# Patient Record
Sex: Female | Born: 1975 | Race: White | Hispanic: No | Smoking: Former smoker
Health system: Southern US, Community
[De-identification: ages and names within clinical notes are randomized; demographics above are authoritative.]

## PROBLEM LIST (undated history)

## (undated) DIAGNOSIS — J309 Allergic rhinitis, unspecified: Secondary | ICD-10-CM

## (undated) DIAGNOSIS — S42009D Fracture of unspecified part of unspecified clavicle, subsequent encounter for fracture with routine healing: Secondary | ICD-10-CM

## (undated) DIAGNOSIS — F909 Attention-deficit hyperactivity disorder, unspecified type: Secondary | ICD-10-CM

## (undated) DIAGNOSIS — Z8742 Personal history of other diseases of the female genital tract: Secondary | ICD-10-CM

## (undated) DIAGNOSIS — S62109A Fracture of unspecified carpal bone, unspecified wrist, initial encounter for closed fracture: Secondary | ICD-10-CM

## (undated) DIAGNOSIS — J329 Chronic sinusitis, unspecified: Secondary | ICD-10-CM

## (undated) HISTORY — DX: Chronic sinusitis, unspecified: J32.9

## (undated) HISTORY — DX: Allergic rhinitis, unspecified: J30.9

## (undated) HISTORY — DX: Personal history of other diseases of the female genital tract: Z87.42

## (undated) HISTORY — DX: Attention-deficit hyperactivity disorder, unspecified type: F90.9

## (undated) HISTORY — DX: Fracture of unspecified part of unspecified clavicle, subsequent encounter for fracture with routine healing: S42.009D

## (undated) HISTORY — DX: Fracture of unspecified carpal bone, unspecified wrist, initial encounter for closed fracture: S62.109A

## (undated) HISTORY — PX: WISDOM TOOTH EXTRACTION: SHX21

---

## 2001-11-26 ENCOUNTER — Other Ambulatory Visit: Admission: RE | Admit: 2001-11-26 | Discharge: 2001-11-26 | Payer: Self-pay | Admitting: Obstetrics and Gynecology

## 2002-06-12 ENCOUNTER — Inpatient Hospital Stay (HOSPITAL_COMMUNITY): Admission: AD | Admit: 2002-06-12 | Discharge: 2002-06-15 | Payer: Self-pay | Admitting: Obstetrics and Gynecology

## 2002-12-07 ENCOUNTER — Other Ambulatory Visit: Admission: RE | Admit: 2002-12-07 | Discharge: 2002-12-07 | Payer: Self-pay | Admitting: Obstetrics and Gynecology

## 2003-12-08 ENCOUNTER — Other Ambulatory Visit: Admission: RE | Admit: 2003-12-08 | Discharge: 2003-12-08 | Payer: Self-pay | Admitting: Obstetrics and Gynecology

## 2004-06-16 ENCOUNTER — Ambulatory Visit (HOSPITAL_COMMUNITY): Admission: RE | Admit: 2004-06-16 | Discharge: 2004-06-16 | Payer: Self-pay | Admitting: Obstetrics and Gynecology

## 2005-02-03 ENCOUNTER — Inpatient Hospital Stay (HOSPITAL_COMMUNITY): Admission: AD | Admit: 2005-02-03 | Discharge: 2005-02-03 | Payer: Self-pay | Admitting: *Deleted

## 2005-02-05 ENCOUNTER — Inpatient Hospital Stay (HOSPITAL_COMMUNITY): Admission: AD | Admit: 2005-02-05 | Discharge: 2005-02-07 | Payer: Self-pay | Admitting: Obstetrics and Gynecology

## 2005-03-21 ENCOUNTER — Other Ambulatory Visit: Admission: RE | Admit: 2005-03-21 | Discharge: 2005-03-21 | Payer: Self-pay | Admitting: Obstetrics and Gynecology

## 2008-01-27 ENCOUNTER — Ambulatory Visit: Payer: Self-pay | Admitting: Internal Medicine

## 2010-04-14 ENCOUNTER — Observation Stay: Payer: Self-pay

## 2010-08-10 ENCOUNTER — Inpatient Hospital Stay: Payer: Self-pay

## 2013-01-16 ENCOUNTER — Inpatient Hospital Stay: Payer: Self-pay | Admitting: Obstetrics and Gynecology

## 2013-01-16 LAB — CBC WITH DIFFERENTIAL/PLATELET
Basophil %: 0.1 %
Lymphocyte #: 1.3 10*3/uL (ref 1.0–3.6)
MCV: 91 fL (ref 80–100)
Monocyte #: 1.1 x10 3/mm — ABNORMAL HIGH (ref 0.2–0.9)
Monocyte %: 6 %
Neutrophil #: 16.4 10*3/uL — ABNORMAL HIGH (ref 1.4–6.5)
Platelet: 178 10*3/uL (ref 150–440)
RBC: 3.86 10*6/uL (ref 3.80–5.20)
RDW: 13.4 % (ref 11.5–14.5)
WBC: 18.9 10*3/uL — ABNORMAL HIGH (ref 3.6–11.0)

## 2014-09-28 NOTE — H&P (Signed)
L&D Evaluation:  History Expanded:  HPI 39 yo G5P3013 at 3737w4d gestational age by LMP consistent with 1st trimester ultrasound. Pregnancy complicated by advanced maternal age (NIFT testing negative).  She presents in advanced labor. positive fetal movement, no vaginal bleeding, no leakage of fluid.   Gravida 5   Term 3   PreTerm 0   Abortion 1   Living 3   Blood Type (Maternal) O positive   Group B Strep Results Maternal (Result >5wks must be treated as unknown) negative   Maternal HIV Negative   Maternal Syphilis Ab Nonreactive   Maternal Varicella Immune   Rubella Results (Maternal) immune   Maternal [facility]-Dap Immune   [facility]   Patient's Medical History No Chronic Illness   Patient's Surgical History Othropedic surgery of clavicle and wrist due to fracture, wisdom teeth extraction   Medications Pre Natal Vitamins   Allergies NKDA   Social History none   Family History Non-Contributory   ROS:  ROS All systems were reviewed.  HEENT, CNS, GI, GU, Respiratory, CV, Renal and Musculoskeletal systems were found to be normal., unless noted in HPI   Exam:  General mild-moderate distress with contractions   Mental Status clear   Chest moves air well   Abdomen gravid, tender with contractions   Estimated Fetal Weight Average for gestational age   Fetal Position cephalic   Edema no edema   Pelvic Fully dilated per RN   Mebranes Ruptured, shortly after arrival (clear fluid per RN)   Description clear   FHT normal rate with no decels   Skin no lesions   Impression:  Impression active labor   Plan:  Plan EFM/NST   Comments expectant management. expectant delivery couple desires cord blood collection for private blood banking   Electronic Signatures: Conard NovakJackson, Elmer Merwin D (MD)  (Signed 29-Aug-14 11:53)  Authored: L&D Evaluation   Last Updated: 29-Aug-14 11:53 by Conard NovakJackson, Damilola Flamm D (MD)

## 2015-05-22 DIAGNOSIS — Z8742 Personal history of other diseases of the female genital tract: Secondary | ICD-10-CM

## 2015-05-22 HISTORY — DX: Personal history of other diseases of the female genital tract: Z87.42

## 2015-05-22 HISTORY — PX: ABDOMINOPLASTY: SUR9

## 2015-09-21 HISTORY — PX: COLPOSCOPY: SHX161

## 2016-11-12 ENCOUNTER — Emergency Department: Payer: BLUE CROSS/BLUE SHIELD

## 2016-11-12 ENCOUNTER — Emergency Department
Admission: EM | Admit: 2016-11-12 | Discharge: 2016-11-12 | Disposition: A | Discharge status: Home / Self Care | Payer: BLUE CROSS/BLUE SHIELD | Attending: Emergency Medicine | Admitting: Emergency Medicine

## 2016-11-12 ENCOUNTER — Encounter: Payer: Self-pay | Admitting: *Deleted

## 2016-11-12 DIAGNOSIS — M25531 Pain in right wrist: Secondary | ICD-10-CM | POA: Diagnosis present

## 2016-11-12 MED ORDER — MELOXICAM 7.5 MG PO TABS: 7.5000 mg | ORAL_TABLET | Freq: Every day | ORAL | 1 refills | Status: AC

## 2016-11-12 NOTE — ED Triage Notes (Signed)
Pt was playing soccer tonight and tried to stop the ball, pt complains of right wrist pain

## 2016-11-12 NOTE — ED Provider Notes (Signed)
[facility] Emergency Department Provider Note  ____________________________________________  Time seen: Approximately 9:29 PM  I have reviewed the triage vital signs and the nursing notes.   HISTORY  Chief Complaint Wrist Pain    HPI Jamie Meadows is a 41 y.o. female presenting to the emergency department with right wrist pain after a soccer ball collided with right wrist while playing tonight. Patient denies falls, weakness, radiculopathy and diminished sensation of the right upper extremity. Patient denies prior traumas or surgeries to the right upper extremity. No alleviating measures have been attempted.   History reviewed. No pertinent past medical history.  There are no active problems to display for this patient.   History reviewed. No pertinent surgical history.  Prior to Admission medications   Medication Sig Start Date End Date Taking? Authorizing Provider  meloxicam (MOBIC) 7.5 MG tablet Take 1 tablet (7.5 mg total) by mouth daily. 11/12/16 11/19/16  Orvil FeilWoods, Treniya Lobb M, PA-C    Allergies Patient has no known allergies.  No family history on file.  Social History Social History  Substance Use Topics  . Smoking status: Never Smoker  . Smokeless tobacco: Not on file  . Alcohol use Yes     Review of Systems  Constitutional: No fever/chills Eyes: No visual changes. No discharge ENT: No upper respiratory complaints. Cardiovascular: no chest pain. Respiratory: no cough. No SOB. Gastrointestinal: No abdominal pain.  No nausea, no vomiting.  No diarrhea.  No constipation. Musculoskeletal: Patient has right wrist pain Skin: Negative for rash, abrasions, lacerations, ecchymosis. Neurological: Negative for headaches, focal weakness or numbness. ____________________________________________   PHYSICAL EXAM:  VITAL SIGNS: ED Triage Vitals  Enc Vitals Group     BP 11/12/16 2027 131/76     Pulse Rate 11/12/16 2027 (!) 114     Resp  11/12/16 2027 18     Temp 11/12/16 2027 98.6 F (37 C)     Temp Source 11/12/16 2027 Oral     SpO2 11/12/16 2027 100 %     Weight 11/12/16 2027 150 lb (68 kg)     Height 11/12/16 2027 5\' 5"  (1.651 m)     Head Circumference --      Peak Flow --      Pain Score 11/12/16 2029 5     Pain Loc --      Pain Edu? --      Excl. in GC? --      Constitutional: Alert and oriented. Well appearing and in no acute distress. Eyes: Conjunctivae are normal. PERRL. EOMI. Head: Atraumatic. Cardiovascular: Normal rate, regular rhythm. Normal S1 and S2.  Good peripheral circulation. Respiratory: Normal respiratory effort without tachypnea or retractions. Lungs CTAB. Good air entry to the bases with no decreased or absent breath sounds. Musculoskeletal: Patient has 5 out of 5 strength in the upper extremities bilaterally. Patient performs full range of motion at the right shoulder, right elbow and right wrist. Patient can move all 5 right fingers. No pain to palpation of the anatomical snuffbox. Patient has mild tenderness to palpation over the metaphysis of the mid distal radius. Palpable radial and ulnar pulses bilaterally and symmetrically. Neurologic:  Normal speech and language. No gross focal neurologic deficits are appreciated.  Skin: Mild soft tissue swelling of the skin overlying the distal right radius. Psychiatric: Mood and affect are normal. Speech and behavior are normal. Patient exhibits appropriate insight and judgement.   ____________________________________________   LABS (all labs ordered are listed, but only abnormal results are  displayed)  Labs Reviewed - No data to display ____________________________________________  EKG   ____________________________________________  RADIOLOGY Geraldo Pitter, personally viewed and evaluated these images (plain radiographs) as part of my medical decision making, as well as reviewing the written report by the radiologist.  Dg Wrist  Complete Right  Result Date: 11/12/2016 CLINICAL DATA:  Wrist pain after soccer injury EXAM: RIGHT WRIST - COMPLETE 3+ VIEW COMPARISON:  None. FINDINGS: There is no evidence of fracture or dislocation. There is no evidence of arthropathy or other focal bone abnormality. Soft tissues are unremarkable. IMPRESSION: No acute osseous abnormality of the right wrist. Electronically Signed   By: Deatra Robinson M.D.   On: 11/12/2016 21:03    ____________________________________________    PROCEDURES  Procedure(s) performed:    Procedures    Medications - No data to display   ____________________________________________   INITIAL IMPRESSION / ASSESSMENT AND PLAN / ED COURSE  Pertinent labs & imaging results that were available during my care of the patient were reviewed by me and considered in my medical decision making (see chart for details).  Review of the [place] CSRS was performed in accordance of the [facility] prior to dispensing any controlled drugs.     Assessment and plan Right wrist pain Patient's diagnosis is consistent with right wrist pain. Patient will be discharged home with prescriptions for Mobic. A splint was applied in the emergency department. Patient is to follow up with Dr. Joice Lofts as needed. Patient is given ED precautions to return to the ED for any worsening or new symptoms. ____________________________________________  FINAL CLINICAL IMPRESSION(S) / ED DIAGNOSES  Final diagnoses:  Right wrist pain      NEW MEDICATIONS STARTED DURING THIS VISIT:  New Prescriptions   MELOXICAM (MOBIC) 7.5 MG TABLET    Take 1 tablet (7.5 mg total) by mouth daily.        This chart was dictated using voice recognition software/Dragon. Despite best efforts to proofread, errors can occur which can change the meaning. Any change was purely unintentional.    Gasper Lloyd 11/12/16 2136    Rockne Menghini, MD 11/12/16 [phone]

## 2017-05-01 ENCOUNTER — Ambulatory Visit: Payer: Self-pay

## 2017-05-02 ENCOUNTER — Ambulatory Visit: Payer: Self-pay

## 2017-05-03 ENCOUNTER — Ambulatory Visit (INDEPENDENT_AMBULATORY_CARE_PROVIDER_SITE_OTHER): Payer: BLUE CROSS/BLUE SHIELD

## 2017-05-03 DIAGNOSIS — J301 Allergic rhinitis due to pollen: Secondary | ICD-10-CM

## 2017-05-17 ENCOUNTER — Ambulatory Visit (INDEPENDENT_AMBULATORY_CARE_PROVIDER_SITE_OTHER): Payer: BLUE CROSS/BLUE SHIELD

## 2017-05-17 DIAGNOSIS — J301 Allergic rhinitis due to pollen: Secondary | ICD-10-CM | POA: Diagnosis not present

## 2017-05-17 DIAGNOSIS — J309 Allergic rhinitis, unspecified: Secondary | ICD-10-CM

## 2017-05-17 NOTE — Addendum Note (Signed)
Addended by: Loura BackSUTTON, Kobe Ofallon on: 05/17/2017 09:34 AM   Modules accepted: Orders

## 2017-05-31 ENCOUNTER — Ambulatory Visit: Payer: BLUE CROSS/BLUE SHIELD

## 2017-06-06 ENCOUNTER — Ambulatory Visit (INDEPENDENT_AMBULATORY_CARE_PROVIDER_SITE_OTHER): Payer: BLUE CROSS/BLUE SHIELD

## 2017-06-06 DIAGNOSIS — J309 Allergic rhinitis, unspecified: Secondary | ICD-10-CM | POA: Diagnosis not present

## 2017-06-07 ENCOUNTER — Encounter: Payer: Self-pay | Admitting: Internal Medicine

## 2017-06-07 DIAGNOSIS — J301 Allergic rhinitis due to pollen: Secondary | ICD-10-CM | POA: Diagnosis not present

## 2017-06-14 ENCOUNTER — Other Ambulatory Visit: Payer: Self-pay | Admitting: Nurse Practitioner

## 2017-06-14 DIAGNOSIS — J0140 Acute pansinusitis, unspecified: Secondary | ICD-10-CM

## 2017-06-14 MED ORDER — AZITHROMYCIN 250 MG PO TABS: ORAL_TABLET | ORAL | 0 refills | Status: DC

## 2017-06-14 NOTE — Progress Notes (Signed)
Sent in z-pack - take as directed for 5 days to [facility] [address]

## 2017-06-20 ENCOUNTER — Ambulatory Visit: Payer: BLUE CROSS/BLUE SHIELD

## 2017-06-20 DIAGNOSIS — J309 Allergic rhinitis, unspecified: Secondary | ICD-10-CM | POA: Diagnosis not present

## 2017-07-04 ENCOUNTER — Ambulatory Visit: Payer: Self-pay

## 2017-07-19 ENCOUNTER — Ambulatory Visit (INDEPENDENT_AMBULATORY_CARE_PROVIDER_SITE_OTHER): Payer: BLUE CROSS/BLUE SHIELD

## 2017-07-19 ENCOUNTER — Ambulatory Visit: Payer: Self-pay

## 2017-07-19 DIAGNOSIS — J301 Allergic rhinitis due to pollen: Secondary | ICD-10-CM | POA: Diagnosis not present

## 2017-07-30 ENCOUNTER — Ambulatory Visit: Payer: BLUE CROSS/BLUE SHIELD | Admitting: Nurse Practitioner

## 2017-07-30 ENCOUNTER — Encounter: Payer: Self-pay | Admitting: Nurse Practitioner

## 2017-07-30 ENCOUNTER — Ambulatory Visit (INDEPENDENT_AMBULATORY_CARE_PROVIDER_SITE_OTHER): Payer: BLUE CROSS/BLUE SHIELD

## 2017-07-30 VITALS — BP 120/80 | HR 84 | Resp 16 | Ht 64.0 in | Wt 173.0 lb

## 2017-07-30 DIAGNOSIS — B379 Candidiasis, unspecified: Secondary | ICD-10-CM | POA: Diagnosis not present

## 2017-07-30 DIAGNOSIS — J309 Allergic rhinitis, unspecified: Secondary | ICD-10-CM | POA: Diagnosis not present

## 2017-07-30 DIAGNOSIS — J301 Allergic rhinitis due to pollen: Secondary | ICD-10-CM | POA: Diagnosis not present

## 2017-07-30 DIAGNOSIS — R41840 Attention and concentration deficit: Secondary | ICD-10-CM | POA: Diagnosis not present

## 2017-07-30 MED ORDER — AMPHETAMINE-DEXTROAMPHETAMINE 20 MG PO TABS: 20.0000 mg | ORAL_TABLET | Freq: Two times a day (BID) | ORAL | 0 refills | Status: DC

## 2017-07-30 MED ORDER — FLUCONAZOLE 150 MG PO TABS: ORAL_TABLET | ORAL | 1 refills | Status: DC

## 2017-07-30 NOTE — Progress Notes (Signed)
[facility] [address] [place], [place] [address]  Internal MEDICINE  Office Visit Note  Patient Name: Jamie Meadows  [id number]  [id number]  Date of Service: 07/30/2017  No chief complaint on file.   The patient is here for routine follow up exam. Today, she believes she may have issue with yeast infection. She has taken two rounds of antibiotics due to sinus infection and feels like infection developed after she completed her second round of antibiotics. She is having some vaginal itching and irritation with some white colored discharge. This has no odor.  She is currently taking adderall 20mg  twice daily to help with focus and concentration. She has high demand job and has four young children. She takes adderall to help keep her focused and on track at work and at home. She needs to have refills for this medication.     Pt is here for routine follow up.    Current Medication: Outpatient Encounter Medications as of 07/30/2017  Medication Sig  . fluticasone (FLONASE) 50 MCG/ACT nasal spray Place into both nostrils daily.  Marland Kitchen loratadine (CLARITIN) 10 MG tablet Take 10 mg by mouth daily.  . Multiple Vitamin (MULTIVITAMIN) tablet Take 1 tablet by mouth daily.  . [DISCONTINUED] amphetamine-dextroamphetamine (ADDERALL XR) 20 MG 24 hr capsule Take 20 mg by mouth daily.  Marland Kitchen amphetamine-dextroamphetamine (ADDERALL) 20 MG tablet Take 1 tablet (20 mg total) by mouth 2 (two) times daily.  Marland Kitchen azithromycin (ZITHROMAX) 250 MG tablet z-pack - take as directed for 5 days (Patient not taking: Reported on 07/30/2017)  . fluconazole (DIFLUCAN) 150 MG tablet Take 1 tablet po once. May repeat dose in 3 days as needed for persistent symptoms.  Marland Kitchen nystatin cream (MYCOSTATIN) Apply 1 application topically 2 (two) times daily.  Marland Kitchen tretinoin (RETIN-A) 0.05 % cream Apply topically at bedtime.  . [DISCONTINUED] amphetamine-dextroamphetamine (ADDERALL) 20 MG tablet Take 1 tablet (20 mg total) by  mouth 2 (two) times daily.  . [DISCONTINUED] amphetamine-dextroamphetamine (ADDERALL) 20 MG tablet Take 1 tablet (20 mg total) by mouth 2 (two) times daily.   No facility-administered encounter medications on file as of 07/30/2017.     Surgical History: No past surgical history on file.  Medical History: No past medical history on file.  Family History: No family history on file.  Social History   Socioeconomic History  . Marital status: Married    Spouse name: Not on file  . Number of children: Not on file  . Years of education: Not on file  . Highest education level: Not on file  Social Needs  . Financial resource strain: Not on file  . Food insecurity - worry: Not on file  . Food insecurity - inability: Not on file  . Transportation needs - medical: Not on file  . Transportation needs - non-medical: Not on file  Occupational History  . Not on file  Tobacco Use  . Smoking status: Never Smoker  . Smokeless tobacco: Never Used  Substance and Sexual Activity  . Alcohol use: Yes  . Drug use: Not on file  . Sexual activity: Not on file  Other Topics Concern  . Not on file  Social History Narrative  . Not on file      Review of Systems  Constitutional: Negative for activity change, chills, fatigue and unexpected weight change.  HENT: Negative for congestion, postnasal drip, rhinorrhea, sneezing and sore throat.   Eyes: Negative.  Negative for redness.  Respiratory: Negative for cough, chest tightness  and shortness of breath.   Cardiovascular: Negative for chest pain and palpitations.  Gastrointestinal: Negative for abdominal pain, constipation, diarrhea, nausea and vomiting.  Endocrine: Negative for cold intolerance, heat intolerance, polydipsia, polyphagia and polyuria.  Genitourinary: Positive for vaginal discharge. Negative for dysuria and frequency.       Vaginal itching and irritation.   Musculoskeletal: Negative for arthralgias, back pain, joint swelling and  neck pain.  Skin: Negative for rash.  Allergic/Immunologic: Positive for environmental allergies.  Neurological: Negative for tremors, numbness and headaches.  Hematological: Negative for adenopathy. Does not bruise/bleed easily.  Psychiatric/Behavioral: Positive for decreased concentration. Negative for behavioral problems (Depression), sleep disturbance and suicidal ideas. The patient is not nervous/anxious.     Today's Vitals   07/30/17 0923  BP: 120/80  Pulse: 84  Resp: 16  SpO2: 98%  Weight: 173 lb (78.5 kg)  Height: 5\' 4"  (1.626 m)    Physical Exam  Constitutional: She is oriented to person, place, and time. She appears well-developed and well-nourished. No distress.  HENT:  Head: Normocephalic and atraumatic.  Mouth/Throat: Oropharynx is clear and moist. No oropharyngeal exudate.  Eyes: EOM are normal. Pupils are equal, round, and reactive to light.  Neck: Normal range of motion. Neck supple. No JVD present. No tracheal deviation present. No thyromegaly present.  Cardiovascular: Normal rate, regular rhythm and normal heart sounds. Exam reveals no gallop and no friction rub.  No murmur heard. Pulmonary/Chest: Effort normal and breath sounds normal. No respiratory distress. She has no wheezes. She has no rales. She exhibits no tenderness.  Abdominal: Soft. Bowel sounds are normal. There is no tenderness.  Musculoskeletal: Normal range of motion.  Lymphadenopathy:    She has no cervical adenopathy.  Neurological: She is alert and oriented to person, place, and time. No cranial nerve deficit.  Skin: Skin is warm and dry. She is not diaphoretic.  Psychiatric: She has a normal mood and affect. Her behavior is normal. Judgment and thought content normal.  Nursing note and vitals reviewed.  Assessment/Plan: 1. Attention and concentration deficit diong well. May continue adderall 20mg  BID as needed. Three 30 day prescriptions provided. Dates are 07/30/2017, 08/28/2017, and  09/25/2017.  - amphetamine-dextroamphetamine (ADDERALL) 20 MG tablet; Take 1 tablet (20 mg total) by mouth 2 (two) times daily.  Dispense: 60 tablet; Refill: 0  2. Candidiasis - fluconazole (DIFLUCAN) 150 MG tablet; Take 1 tablet po once. May repeat dose in 3 days as needed for persistent symptoms.  Dispense: 3 tablet; Refill: 1  3. Chronic allergic rhinitis Continue to take oral allergy medication as prescribed. Continue allergy injections as scheduled.   General Counseling: Jamie LevinsJennifer verbalizes understanding of the findings of todays visit and agrees with plan of treatment. I have discussed any further diagnostic evaluation that may be needed or ordered today. We also reviewed her medications today. she has been encouraged to call the office with any questions or concerns that should arise related to todays visit.  This patient was seen by Vincent GrosHeather Tykwon Fera, FNP- C in Collaboration with Dr Lyndon CodeFozia M Khan as a part of collaborative care agreement    Meds ordered this encounter  Medications  . DISCONTD: amphetamine-dextroamphetamine (ADDERALL) 20 MG tablet    Sig: Take 1 tablet (20 mg total) by mouth 2 (two) times daily.    Dispense:  60 tablet    Refill:  0    Order Specific Question:   Supervising Provider    Answer:   Lyndon CodeKHAN, FOZIA M [[id number]]  . DISCONTD:  amphetamine-dextroamphetamine (ADDERALL) 20 MG tablet    Sig: Take 1 tablet (20 mg total) by mouth 2 (two) times daily.    Dispense:  60 tablet    Refill:  0    Fill after 08/28/2017    Order Specific Question:   Supervising Provider    Answer:   Lyndon Code [[id number]]  . amphetamine-dextroamphetamine (ADDERALL) 20 MG tablet    Sig: Take 1 tablet (20 mg total) by mouth 2 (two) times daily.    Dispense:  60 tablet    Refill:  0    Fill after 09/25/2017    Order Specific Question:   Supervising Provider    Answer:   Lyndon Code [[id number]]  . fluconazole (DIFLUCAN) 150 MG tablet    Sig: Take 1 tablet po once. May repeat dose in 3 days as needed  for persistent symptoms.    Dispense:  3 tablet    Refill:  1    Order Specific Question:   Supervising Provider    Answer:   Lyndon Code [[id number]]    Time spent: 36 Minutes     Dr Lyndon Code Internal medicine

## 2017-08-01 ENCOUNTER — Ambulatory Visit: Payer: Self-pay

## 2017-08-05 ENCOUNTER — Ambulatory Visit: Payer: Self-pay | Admitting: Internal Medicine

## 2017-08-13 ENCOUNTER — Ambulatory Visit (INDEPENDENT_AMBULATORY_CARE_PROVIDER_SITE_OTHER): Payer: BLUE CROSS/BLUE SHIELD

## 2017-08-13 DIAGNOSIS — J301 Allergic rhinitis due to pollen: Secondary | ICD-10-CM | POA: Diagnosis not present

## 2017-08-26 ENCOUNTER — Telehealth: Payer: Self-pay | Admitting: Nurse Practitioner

## 2017-08-26 ENCOUNTER — Encounter: Payer: Self-pay | Admitting: Internal Medicine

## 2017-08-26 ENCOUNTER — Ambulatory Visit (INDEPENDENT_AMBULATORY_CARE_PROVIDER_SITE_OTHER): Payer: BLUE CROSS/BLUE SHIELD | Admitting: Internal Medicine

## 2017-08-26 ENCOUNTER — Ambulatory Visit (INDEPENDENT_AMBULATORY_CARE_PROVIDER_SITE_OTHER): Payer: BLUE CROSS/BLUE SHIELD

## 2017-08-26 VITALS — BP 122/84 | HR 79 | Temp 98.1°F | Ht 64.0 in | Wt 172.8 lb

## 2017-08-26 DIAGNOSIS — J301 Allergic rhinitis due to pollen: Secondary | ICD-10-CM

## 2017-08-26 DIAGNOSIS — J029 Acute pharyngitis, unspecified: Secondary | ICD-10-CM | POA: Diagnosis not present

## 2017-08-26 DIAGNOSIS — J40 Bronchitis, not specified as acute or chronic: Secondary | ICD-10-CM

## 2017-08-26 LAB — POCT RAPID STREP A (OFFICE): Rapid Strep A Screen: NEGATIVE

## 2017-08-26 MED ORDER — AZITHROMYCIN 250 MG PO TABS: ORAL_TABLET | ORAL | 0 refills | Status: DC

## 2017-08-26 NOTE — Patient Instructions (Signed)

## 2017-08-26 NOTE — Progress Notes (Signed)
[facility] [address] [place], [place] [address]  Pulmonary Sleep Medicine   Office Visit Note  Patient Name: Jamie Meadows DOB: 01-13-1976 MRN [id number]  Date of Service: 08/26/2017  Complaints/HPI: She has lost her voice about 2 days ago. She has had sore throat and also has some drainage noted. She states that she is tired also. She had a strep test done which was negative. Her children has been sick also. She has no fever noted and has some sputum production no hemoptysis. She is not around any smokers. She did recently travel to [place]. In addition she states that she does have heartburn symptoms and she takes zantac prn  ROS  General: (-) fever, (-) chills, (-) night sweats, (-) weakness Skin: (-) rashes, (-) itching,. Eyes: (-) visual changes, (-) redness, (-) itching. Nose and Sinuses: (-) nasal stuffiness or itchiness, (-) postnasal drip, (-) nosebleeds, (-) sinus trouble. Mouth and Throat: (-) sore throat, (-) hoarseness. Neck: (-) swollen glands, (-) enlarged thyroid, (-) neck pain. Respiratory: + cough, (-) bloody sputum, + shortness of breath, + wheezing. Cardiovascular: - ankle swelling, (-) chest pain. Lymphatic: (-) lymph node enlargement. Neurologic: (-) numbness, (-) tingling. Psychiatric: (-) anxiety, (-) depression   Current Medication: Outpatient Encounter Medications as of 08/26/2017  Medication Sig  . amphetamine-dextroamphetamine (ADDERALL) 20 MG tablet Take 1 tablet (20 mg total) by mouth 2 (two) times daily.  . fluticasone (FLONASE) 50 MCG/ACT nasal spray Place into both nostrils daily.  Marland Kitchen loratadine (CLARITIN) 10 MG tablet Take 10 mg by mouth daily.  . Multiple Vitamin (MULTIVITAMIN) tablet Take 1 tablet by mouth daily.  Marland Kitchen azithromycin (ZITHROMAX) 250 MG tablet z-pack - take as directed for 5 days (Patient not taking: Reported on 07/30/2017)  . fluconazole (DIFLUCAN) 150 MG tablet Take 1 tablet po once. May repeat dose in 3 days as  needed for persistent symptoms. (Patient not taking: Reported on 08/26/2017)  . nystatin cream (MYCOSTATIN) Apply 1 application topically 2 (two) times daily.  Marland Kitchen tretinoin (RETIN-A) 0.05 % cream Apply topically at bedtime.   No facility-administered encounter medications on file as of 08/26/2017.     Surgical History: No past surgical history on file.  Medical History: Past Medical History:  Diagnosis Date  . Allergic rhinitis   . Sinusitis     Family History: Family History  Problem Relation Age of Onset  . Diabetes Mother   . Allergies Father     Social History: Social History   Socioeconomic History  . Marital status: Married    Spouse name: Not on file  . Number of children: Not on file  . Years of education: Not on file  . Highest education level: Not on file  Occupational History  . Not on file  Social Needs  . Financial resource strain: Not on file  . Food insecurity:    Worry: Not on file    Inability: Not on file  . Transportation needs:    Medical: Not on file    Non-medical: Not on file  Tobacco Use  . Smoking status: Former Games developer  . Smokeless tobacco: Never Used  Substance and Sexual Activity  . Alcohol use: Yes  . Drug use: Never  . Sexual activity: Not on file  Lifestyle  . Physical activity:    Days per week: Not on file    Minutes per session: Not on file  . Stress: Not on file  Relationships  . Social connections:    Talks  on phone: Not on file    Gets together: Not on file    Attends religious service: Not on file    Active member of club or organization: Not on file    Attends meetings of clubs or organizations: Not on file    Relationship status: Not on file  . Intimate partner violence:    Fear of current or ex partner: Not on file    Emotionally abused: Not on file    Physically abused: Not on file    Forced sexual activity: Not on file  Other Topics Concern  . Not on file  Social History Narrative  . Not on file    Vital  Signs: Blood pressure 122/84, pulse 79, temperature 98.1 F (36.7 C), height 5\' 4"  (1.626 m), weight 172 lb 12.8 oz (78.4 kg), SpO2 98 %.  Examination: General Appearance: The patient is well-developed, well-nourished, and in no distress. Skin: Gross inspection of skin unremarkable. Head: normocephalic, no gross deformities. Eyes: no gross deformities noted. ENT: ears appear grossly normal no exudates. Neck: Supple. No thyromegaly. No LAD. Respiratory: Scattered rhonchi. Cardiovascular: Normal S1 and S2 without murmur or rub. Extremities: No cyanosis. pulses are equal. Neurologic: Alert and oriented. No involuntary movements.  LABS: No results found for this or any previous visit (from the past 2160 hour(s)).  Radiology: Dg Wrist Complete Right  Result Date: 11/12/2016 CLINICAL DATA:  Wrist pain after soccer injury EXAM: RIGHT WRIST - COMPLETE 3+ VIEW COMPARISON:  None. FINDINGS: There is no evidence of fracture or dislocation. There is no evidence of arthropathy or other focal bone abnormality. Soft tissues are unremarkable. IMPRESSION: No acute osseous abnormality of the right wrist. Electronically Signed   By: Deatra RobinsonKevin  Herman M.D.   On: 11/12/2016 21:03    No results found.  No results found.    Assessment and Plan: Patient Active Problem List   Diagnosis Date Noted  . Attention and concentration deficit 07/30/2017  . Candidiasis 07/30/2017  . Chronic allergic rhinitis 07/30/2017    1. Acute bronchitis  Patient right now is going to be given a Z-Pak for acute bronchitis.  Her strep test was actually negative.  She does get relief from Z-Pak she last received 1 in January. 2. Allergic rhinitis  She will continue with her allergy shots she is on twice a month which will be continued 3. Heartburn on Zantac patient right now is on a daily dose will continue with Zantac as ordered as a daily dose  General Counseling: I have discussed the findings of the evaluation and  examination with Jamie Meadows.  I have also discussed any further diagnostic evaluation thatmay be needed or ordered today. Jamie Meadows verbalizes understanding of the findings of todays visit. We also reviewed her medications today and discussed drug interactions and side effects including but not limited excessive drowsiness and altered mental states. We also discussed that there is always a risk not just to her but also people around her. she has been encouraged to call the office with any questions or concerns that should arise related to todays visit.    Time spent: 15min  I have personally obtained a history, examined the patient, evaluated laboratory and imaging results, formulated the assessment and plan and placed orders.    Yevonne PaxSaadat A Kinsley Nicklaus, MD [facility] Pulmonary and Critical Care Sleep medicine

## 2017-08-27 ENCOUNTER — Other Ambulatory Visit: Payer: Self-pay | Admitting: Nurse Practitioner

## 2017-08-27 DIAGNOSIS — K921 Melena: Secondary | ICD-10-CM

## 2017-08-27 NOTE — Telephone Encounter (Signed)
Sent order for hemoccult stool to lab. She can go anytime to pick up collection kit.

## 2017-08-27 NOTE — Progress Notes (Signed)
Sent order for hemoccult stool to lab. She can go anytime to pick up collection kit.

## 2017-08-28 NOTE — Telephone Encounter (Signed)
Left message and advised pt to pick up stool card kit at [facility]

## 2017-09-04 ENCOUNTER — Encounter: Payer: Self-pay | Admitting: Certified Nurse Midwife

## 2017-09-04 ENCOUNTER — Ambulatory Visit (INDEPENDENT_AMBULATORY_CARE_PROVIDER_SITE_OTHER): Payer: BLUE CROSS/BLUE SHIELD | Admitting: Certified Nurse Midwife

## 2017-09-04 VITALS — BP 108/68 | HR 81 | Ht 64.0 in | Wt 170.0 lb

## 2017-09-04 DIAGNOSIS — Z1239 Encounter for other screening for malignant neoplasm of breast: Secondary | ICD-10-CM

## 2017-09-04 DIAGNOSIS — Z1231 Encounter for screening mammogram for malignant neoplasm of breast: Secondary | ICD-10-CM

## 2017-09-04 DIAGNOSIS — R87612 Low grade squamous intraepithelial lesion on cytologic smear of cervix (LGSIL): Secondary | ICD-10-CM | POA: Diagnosis not present

## 2017-09-04 DIAGNOSIS — Z124 Encounter for screening for malignant neoplasm of cervix: Secondary | ICD-10-CM | POA: Diagnosis not present

## 2017-09-04 DIAGNOSIS — Z01419 Encounter for gynecological examination (general) (routine) without abnormal findings: Secondary | ICD-10-CM

## 2017-09-04 NOTE — Progress Notes (Signed)
Gynecology Annual Exam  PCP: Carlean Jews, NP  Chief Complaint:  Chief Complaint  Patient presents with  . Gynecologic Exam    History of Present Illness: Jamie Meadows is a 42 y.o. [id number] who  presents for her annual gyn exam. The patient has no significant gyn complaints today.  Her menses are regular, they occur every month, and they last 1 day. Her flow is light and usually just spotting on the Mirena IUD.She does not have intermenstrual bleeding. Her last menstrual period was 08/28/2017. She denies dysmenorrhea. 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 A colposcopy was done and the impression was HPV changes.   The patient is sexually active. She currently uses the Mirena IUD  for contraception. It was inserted 03/16/2013  Since her last visit for her colposcopy, she has had an abdominoplasty.  Her past medical history is remarkable for allergic rhinitis and concentration and focus difficulties.  The patient does not perform self breast exams. Her last mammogram was NA   There is no family history of breast cancer.   There is no family history of ovarian cancer.   The patient denies smoking.  She reports drinking alcohol. She reports have 7 drinks per week.  She denies illegal drug use.  The patient reports exercising occasionally.  The patient denies current symptoms of depression.    Review of Systems: Review of Systems  Constitutional: Negative for chills, fever and weight loss.  HENT: Positive for congestion (nasal). Negative for sinus pain and sore throat.   Eyes: Negative for blurred vision and pain.  Respiratory: Negative for hemoptysis, shortness of breath and wheezing.   Cardiovascular: Negative for chest pain, palpitations and leg swelling.  Gastrointestinal: Positive for blood in stool and diarrhea. Negative for abdominal pain, heartburn, nausea and vomiting.  Genitourinary: Negative for dysuria, frequency,  hematuria and urgency.  Musculoskeletal: Negative for back pain, joint pain and myalgias.  Skin: Negative for itching and rash.  Neurological: Negative for dizziness, tingling and headaches.  Endo/Heme/Allergies: Positive for environmental allergies. Negative for polydipsia. Does not bruise/bleed easily.       Negative for hirsutism   Psychiatric/Behavioral: Negative for depression. The patient is not nervous/anxious and does not have insomnia.     Past Medical History:  Past Medical History:  Diagnosis Date  . Allergic rhinitis   . Fracture of clavicle with routine healing right  . History of abnormal cervical Pap smear 2017   LGSIL with HRHPV  . Sinusitis   . Wrist fracture, closed    left    Past Surgical History:  Past Surgical History:  Procedure Laterality Date  . ABDOMINOPLASTY  2017  . COLPOSCOPY  09/21/2015  . WISDOM TOOTH EXTRACTION      Family History:  Family History  Problem Relation Age of Onset  . Diabetes Mother   . Kidney disease Mother   . Allergies Father   . Breast cancer Maternal Aunt 50  . Diabetes Maternal Grandfather   . Hypertension Maternal Grandfather     Social History:  Social History   Socioeconomic History  . Marital status: Legally Separated    Spouse name: Not on file  . Number of children: 4  . Years of education: 49  . Highest education level: Not on file  Occupational History  . 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 Financial resource strain: Not on file  . Food insecurity:    Worry: Not  on file    Inability: Not on file  . Transportation needs:    Medical: Not on file    Non-medical: Not on file  Tobacco Use  . Smoking status: Former Games developer  . Smokeless tobacco: Never Used  Substance and Sexual Activity  . Alcohol use: Yes    Alcohol/week: 4.2 oz    Types: 7 Glasses of wine per week  . Drug use: Never  . Sexual activity: Yes    Partners: Female    Birth control/protection: IUD  Lifestyle  .  Physical activity:    Days per week: 4 days    Minutes per session: 30 min  . Stress: Not at all  Relationships  . Social connections:    Talks on phone: Not on file    Gets together: Not on file    Attends religious service: Not on file    Active member of club or organization: Not on file    Attends meetings of clubs or organizations: Not on file    Relationship status: Not on file  . Intimate partner violence:    Fear of current or ex partner: Not on file    Emotionally abused: Not on file    Physically abused: Not on file    Forced sexual activity: Not on file  Other Topics Concern  . Not on file  Social History Narrative  . Not on file    Allergies:  No Known Allergies  Medications: Current Outpatient Medications on File Prior to Visit  Medication Sig Dispense Refill  . amphetamine-dextroamphetamine (ADDERALL) 20 MG tablet Take 1 tablet (20 mg total) by mouth 2 (two) times daily. 60 tablet 0  . fluticasone (FLONASE) 50 MCG/ACT nasal spray Place into both nostrils daily.    Marland Kitchen levonorgestrel (MIRENA) 20 MCG/24HR IUD 1 each by Intrauterine route once.    . loratadine (CLARITIN) 10 MG tablet Take 10 mg by mouth daily.    . Multiple Vitamin (MULTIVITAMIN) tablet Take 1 tablet by mouth daily.     No current facility-administered medications on file prior to visit.      Physical Exam Vitals: BP 108/68   Pulse 81   Ht 5\' 4"  (1.626 m)   Wt 170 lb (77.1 kg)   LMP 08/28/2017 Comment: light with IUD  BMI 29.18 kg/m   General: WF in NAD HEENT: normocephalic, anicteric Neck: no thyroid enlargement, no palpable nodules, no cervical lymphadenopathy  Pulmonary: No increased work of breathing, CTAB Cardiovascular: RRR, without murmur  Breast: Breast symmetrical, no tenderness, no palpable nodules or masses, no skin or nipple retraction present, no nipple discharge.  No axillary, infraclavicular or supraclavicular lymphadenopathy. Abdomen: Soft, non-tender, non-distended.   Umbilicus without lesions.  No hepatomegaly or masses palpable. No evidence of hernia. Scar on lower abdomen from abdominoplasty noted. Genitourinary:  External: Normal external female genitalia.  Normal urethral meatus, normal Bartholin's and Skene's glands.    Vagina: Normal vaginal mucosa, no evidence of prolapse.    Cervix: Grossly normal in appearance, IUD strings visible  Uterus: Anteverted, normal size, shape, and consistency, mobile, and non-tender  Adnexa: No adnexal masses, non-tender  Rectal: deferred  Lymphatic: no evidence of inguinal lymphadenopathy Extremities: no edema, erythema, or tenderness Neurologic: Grossly intact Psychiatric: mood appropriate, affect full     Assessment: 42 y.o. normal annual gyn exam IUD is expiring-desires replacement LGSIL Pap with positive HRHPV  Plan:   1) Breast cancer screening - recommend monthly self breast exam and annual screening mammograms. Mammogram was  ordered today.  2) STI screening was offered and declined.  3) Cervical cancer screening - Pap was done.   4) RTO in the next month for MIrena IUD replacement  5) Routine healthcare maintenance including cholesterol and diabetes screening managed by PCP   Farrel Connersolleen Philomena Buttermore, CNM

## 2017-09-05 ENCOUNTER — Other Ambulatory Visit: Payer: Self-pay | Admitting: Nurse Practitioner

## 2017-09-05 ENCOUNTER — Encounter: Payer: Self-pay | Admitting: Certified Nurse Midwife

## 2017-09-05 ENCOUNTER — Telehealth: Payer: Self-pay

## 2017-09-05 DIAGNOSIS — R87612 Low grade squamous intraepithelial lesion on cytologic smear of cervix (LGSIL): Secondary | ICD-10-CM | POA: Insufficient documentation

## 2017-09-05 NOTE — Telephone Encounter (Signed)
Gave labslip for occult fecal test

## 2017-09-07 LAB — FECAL OCCULT BLOOD, IMMUNOCHEMICAL: FECAL OCCULT BLD: POSITIVE — AB

## 2017-09-09 ENCOUNTER — Ambulatory Visit (INDEPENDENT_AMBULATORY_CARE_PROVIDER_SITE_OTHER): Payer: BLUE CROSS/BLUE SHIELD

## 2017-09-09 DIAGNOSIS — J301 Allergic rhinitis due to pollen: Secondary | ICD-10-CM

## 2017-09-09 LAB — IGP, APTIMA HPV
HPV Aptima: POSITIVE — AB
PAP Smear Comment: 0

## 2017-09-13 ENCOUNTER — Ambulatory Visit (INDEPENDENT_AMBULATORY_CARE_PROVIDER_SITE_OTHER): Payer: BLUE CROSS/BLUE SHIELD | Admitting: Certified Nurse Midwife

## 2017-09-13 ENCOUNTER — Encounter: Payer: Self-pay | Admitting: Certified Nurse Midwife

## 2017-09-13 VITALS — BP 104/64 | HR 75 | Ht 64.0 in | Wt 173.0 lb

## 2017-09-13 DIAGNOSIS — Z30431 Encounter for routine checking of intrauterine contraceptive device: Secondary | ICD-10-CM | POA: Insufficient documentation

## 2017-09-13 DIAGNOSIS — R87810 Cervical high risk human papillomavirus (HPV) DNA test positive: Secondary | ICD-10-CM

## 2017-09-13 DIAGNOSIS — Z79899 Other long term (current) drug therapy: Secondary | ICD-10-CM | POA: Insufficient documentation

## 2017-09-13 DIAGNOSIS — Z30433 Encounter for removal and reinsertion of intrauterine contraceptive device: Secondary | ICD-10-CM | POA: Diagnosis not present

## 2017-09-13 DIAGNOSIS — R87610 Atypical squamous cells of undetermined significance on cytologic smear of cervix (ASC-US): Secondary | ICD-10-CM

## 2017-09-13 DIAGNOSIS — Z30430 Encounter for insertion of intrauterine contraceptive device: Secondary | ICD-10-CM

## 2017-09-13 NOTE — Progress Notes (Signed)
    GYNECOLOGY OFFICE PROCEDURE NOTE  Jamie Meadows is a 42 y.o. [phone] here for Mirena IUD replacement because her current IUD is expiring. It was inserted 5 1/2 years ago.   Last pap smear was on 09/04/2017 and was ASCUS with positive HRHPV and will be scheduling a colposcopy.  IUD Insertion Procedure Note Patient identified, informed consent performed, consent signed.   Discussed risks of irregular bleeding, cramping, infection, expulsion,malpositioning or misplacement of the IUD outside the uterus which may require further procedure such as laparoscopy. Time out was performed.    On bimanual exam, uterus was Anteverted Speculum placed in the vagina. Expiring IUD strings were identified. The strings were grasped with a packing forceps and the IUD was removed easily and intact. The cervix was then prepped with Betadine x 3. Cervix was sprayed with Hurricaine anesthetic and  grasped anteriorly with a single tooth tenaculum.  Uterus sounded to 7 cm with the Pipelle easily, but the IUD  inserter was not able to be inserted into the uterine cavity.  The #5 then the #6 dilators were able to be inserted into the uterine cavity. The  Mirena  IUD inserted was then able to be placed per manufacturer's recommendations.  Strings trimmed to 3 cm. Tenaculum was removed, and silver nitrate was applied to tenaculum sites for hemostasis.  Patient tolerated procedure well.   Patient was given post-procedure instructions.   Patient was also asked to check IUD strings periodically and follow up in 4 weeks for IUD check and colposcopy.  Jamie Meadows, CNM 09/13/17

## 2017-09-21 ENCOUNTER — Other Ambulatory Visit
Admission: RE | Admit: 2017-09-21 | Discharge: 2017-09-21 | Disposition: A | Discharge status: Home / Self Care | Payer: BLUE CROSS/BLUE SHIELD | Source: Ambulatory Visit | Attending: Student | Admitting: Student

## 2017-09-21 DIAGNOSIS — R197 Diarrhea, unspecified: Secondary | ICD-10-CM | POA: Insufficient documentation

## 2017-09-21 LAB — GASTROINTESTINAL PANEL BY PCR, STOOL (REPLACES STOOL CULTURE)

## 2017-09-21 LAB — C DIFFICILE QUICK SCREEN W PCR REFLEX
C DIFFICILE (CDIFF) INTERP: NOT DETECTED
C Diff antigen: NEGATIVE
C Diff toxin: NEGATIVE

## 2017-09-23 ENCOUNTER — Ambulatory Visit (INDEPENDENT_AMBULATORY_CARE_PROVIDER_SITE_OTHER): Payer: BLUE CROSS/BLUE SHIELD

## 2017-09-23 DIAGNOSIS — J301 Allergic rhinitis due to pollen: Secondary | ICD-10-CM

## 2017-09-25 LAB — CALPROTECTIN, FECAL

## 2017-10-07 ENCOUNTER — Telehealth: Payer: Self-pay

## 2017-10-07 ENCOUNTER — Other Ambulatory Visit: Payer: Self-pay | Admitting: Nurse Practitioner

## 2017-10-07 ENCOUNTER — Ambulatory Visit: Payer: Self-pay

## 2017-10-07 DIAGNOSIS — R41840 Attention and concentration deficit: Secondary | ICD-10-CM

## 2017-10-07 MED ORDER — AMPHETAMINE-DEXTROAMPHETAMINE 20 MG PO TABS: 20.0000 mg | ORAL_TABLET | Freq: Two times a day (BID) | ORAL | 0 refills | Status: DC

## 2017-10-07 NOTE — Telephone Encounter (Signed)
Left vm that adderal rx sent to pharmacy.  dbs

## 2017-10-07 NOTE — Telephone Encounter (Signed)
Note sent to dr 

## 2017-10-07 NOTE — Progress Notes (Signed)
Refilled adderall  twice daily as needed. Sent #60 with no refills.

## 2017-10-07 NOTE — Telephone Encounter (Signed)
Refilled adderall 20mg twice daily as needed. Sent #60 with no refills.  

## 2017-10-17 ENCOUNTER — Encounter: Payer: Self-pay | Admitting: Obstetrics & Gynecology

## 2017-10-17 ENCOUNTER — Ambulatory Visit (INDEPENDENT_AMBULATORY_CARE_PROVIDER_SITE_OTHER): Payer: BLUE CROSS/BLUE SHIELD | Admitting: Obstetrics & Gynecology

## 2017-10-17 VITALS — BP 118/80 | Ht 64.0 in | Wt 174.0 lb

## 2017-10-17 DIAGNOSIS — R87810 Cervical high risk human papillomavirus (HPV) DNA test positive: Secondary | ICD-10-CM | POA: Diagnosis not present

## 2017-10-17 DIAGNOSIS — R87610 Atypical squamous cells of undetermined significance on cytologic smear of cervix (ASC-US): Secondary | ICD-10-CM

## 2017-10-17 NOTE — Addendum Note (Signed)
Addended by: Nadara Mustard on: 10/17/2017 10:03 AM   Modules accepted: Orders

## 2017-10-17 NOTE — Progress Notes (Signed)
HPI:  Jamie Meadows is a 42 y.o.  [id number]  who presents today for evaluation and management of abnormal cervical cytology.    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  OB History  Gravida Para Term Preterm AB Living  SAB TAB Ectopic Multiple Live Births  1       4    # Outcome Date GA Lbr Len/2nd Weight Sex Delivery Anes PTL Lv  5 Term 01/15/13 [redacted]w[redacted]d  8 lb 2 oz (3.685 kg) F Vag-Spont   LIV  4 SAB 01/02/12          3 Term 08/11/10 [redacted]w[redacted]d  7 lb 15 oz (3.6 kg) M Vag-Spont   LIV     Birth Comments: 2nd degree tear  2 Term 01/19/05 [redacted]w[redacted]d  8 lb (3.629 kg) M Vag-Spont   LIV  1 Term 05/21/02 [redacted]w[redacted]d  7 lb (3.175 kg) M Vag-Spont   LIV    Past Medical History:  Diagnosis Date  . Allergic rhinitis   . Fracture of clavicle with routine healing right  . History of abnormal cervical Pap smear 2017   LGSIL with HRHPV  . Sinusitis   . Wrist fracture, closed    left    Past Surgical History:  Procedure Laterality Date  . ABDOMINOPLASTY  2017  . COLPOSCOPY  09/21/2015  . WISDOM TOOTH EXTRACTION      SOCIAL HISTORY: Social History   Substance and Sexual Activity  Alcohol Use Yes  . Alcohol/week: 4.2 oz  . Types: 7 Glasses of wine per week   Social History   Substance and Sexual Activity  Drug Use Never     Family History  Problem Relation Age of Onset  . Diabetes Mother   . Kidney disease Mother   . Allergies Father   . Breast cancer Maternal Aunt 50  . Diabetes Maternal Grandfather   . Hypertension Maternal Grandfather     ALLERGIES:  Patient has no known allergies.  Current Outpatient Medications on File Prior to Visit  Medication Sig Dispense Refill  . amphetamine-dextroamphetamine (ADDERALL) 20 MG tablet Take 1 tablet (20 mg total) by mouth 2 (two) times daily. 60 tablet 0  . fluticasone (FLONASE)  50 MCG/ACT nasal spray Place into both nostrils daily.    Marland Kitchen levonorgestrel (MIRENA) 20 MCG/24HR IUD 1 each by Intrauterine route once.    . loratadine (CLARITIN) 10 MG tablet Take 10 mg by mouth daily.    . Multiple Vitamin (MULTIVITAMIN) tablet Take 1 tablet by mouth daily.     No current facility-administered medications on file prior to visit.     Physical Exam: -Vitals:  BP 118/80   Ht  (1.626 m)   Wt 174 lb (78.9 kg)   LMP 09/27/2017   BMI 29.87 kg/m  GEN: WD, WN, NAD.  A+ O x 3, good mood and affect. ABD:  NT, ND.  Soft, no masses.  No hernias noted.   Pelvic:   Vulva: Normal appearance.  No lesions.  Vagina: No lesions or abnormalities noted.  Support: Normal pelvic support.  Urethra No masses tenderness or scarring.  Meatus Normal size without lesions or prolapse.  Cervix: See below.  Anus: Normal exam.  No lesions.  Perineum: Normal exam.  No lesions.  Bimanual   Uterus: Normal size.  Non-tender.  Mobile.  AV.  Adnexae: No masses.  Non-tender to palpation.  Cul-de-sac: Negative for abnormality.   PROCEDURE: 1.  Urine Pregnancy Test:  not done 2.  Colposcopy performed with 4% acetic acid after verbal consent obtained                           -Aceto-white Lesions Location(s): 6 o'clock.              -Biopsy performed at 6,12 o'clock               -ECC indicated and performed: Yes.       -Biopsy sites made hemostatic with pressure, AgNO3, and/or Monsel's solution   -Satisfactory colposcopy: Yes.      -Evidence of Invasive cervical CA :  NO IUD strings seen, 1/2 cm  ASSESSMENT:  Jamie Meadows is a 42 y.o. [id number] here for No diagnosis found.Marland Kitchen  PLAN: 1.  I discussed the grading system of pap smears and HPV high risk viral types.  We will discuss and base management after colpo results return. 2. Follow up PAP 6 months, vs intervention if high grade dysplasia identified 3. Treatment of persistantly abnormal PAP smears and cervical dysplasia, even  mild, is discussed w pt today in detail, as well as the pros and cons of Cryo and LEEP procedures. Will consider and discuss after results. 4. Cont IUD     Annamarie Major, MD, [facility], [facility] 10/17/2017  8:10 AM

## 2017-10-17 NOTE — Patient Instructions (Signed)

## 2017-10-21 LAB — PATHOLOGY

## 2017-10-28 ENCOUNTER — Ambulatory Visit (INDEPENDENT_AMBULATORY_CARE_PROVIDER_SITE_OTHER): Payer: BLUE CROSS/BLUE SHIELD | Admitting: Nurse Practitioner

## 2017-10-28 ENCOUNTER — Encounter: Payer: Self-pay | Admitting: Nurse Practitioner

## 2017-10-28 ENCOUNTER — Ambulatory Visit (INDEPENDENT_AMBULATORY_CARE_PROVIDER_SITE_OTHER): Payer: BLUE CROSS/BLUE SHIELD

## 2017-10-28 VITALS — BP 118/82 | HR 75 | Resp 16 | Ht 64.0 in | Wt 173.0 lb

## 2017-10-28 DIAGNOSIS — J301 Allergic rhinitis due to pollen: Secondary | ICD-10-CM | POA: Diagnosis not present

## 2017-10-28 DIAGNOSIS — R41840 Attention and concentration deficit: Secondary | ICD-10-CM | POA: Diagnosis not present

## 2017-10-28 DIAGNOSIS — R300 Dysuria: Secondary | ICD-10-CM

## 2017-10-28 MED ORDER — AMPHETAMINE-DEXTROAMPHETAMINE 20 MG PO TABS: 20.0000 mg | ORAL_TABLET | Freq: Two times a day (BID) | ORAL | 0 refills | Status: DC

## 2017-10-28 NOTE — Addendum Note (Signed)
Addended by: Loura BackSUTTON, Pamela Intrieri on: 10/28/2017 09:22 AM   Modules accepted: Orders

## 2017-10-28 NOTE — Progress Notes (Signed)
urin

## 2017-10-28 NOTE — Progress Notes (Signed)
[facility] [address] [place], [place] [address]  Internal MEDICINE  Office Visit Note  Patient Name: Jamie Meadows  [id number]  [id number]  Date of Service: 10/28/2017   Pt is here for routine follow up.    Chief Complaint  Patient presents with  . Medication Refill      She is currently taking adderall 20mg  twice daily to help with focus and concentration. She has high demand job and has four young children. She takes adderall to help keep her focused and on track at work and at home. She needs to have refills for this medication.       Current Medication: Outpatient Encounter Medications as of 10/28/2017  Medication Sig  . amphetamine-dextroamphetamine (ADDERALL) 20 MG tablet Take 1 tablet (20 mg total) by mouth 2 (two) times daily.  . fluticasone (FLONASE) 50 MCG/ACT nasal spray Place into both nostrils daily.  Marland Kitchen levonorgestrel (MIRENA) 20 MCG/24HR IUD 1 each by Intrauterine route once.  . loratadine (CLARITIN) 10 MG tablet Take 10 mg by mouth daily.  . Multiple Vitamin (MULTIVITAMIN) tablet Take 1 tablet by mouth daily.  . [DISCONTINUED] amphetamine-dextroamphetamine (ADDERALL) 20 MG tablet Take 1 tablet (20 mg total) by mouth 2 (two) times daily.  . [DISCONTINUED] amphetamine-dextroamphetamine (ADDERALL) 20 MG tablet Take 1 tablet (20 mg total) by mouth 2 (two) times daily.  . [DISCONTINUED] amphetamine-dextroamphetamine (ADDERALL) 20 MG tablet Take 1 tablet (20 mg total) by mouth 2 (two) times daily.   No facility-administered encounter medications on file as of 10/28/2017.     Surgical History: Past Surgical History:  Procedure Laterality Date  . ABDOMINOPLASTY  2017  . COLPOSCOPY  09/21/2015  . WISDOM TOOTH EXTRACTION      Medical History: Past Medical History:  Diagnosis Date  . Allergic rhinitis   . Fracture of clavicle with routine healing right  . History of abnormal cervical Pap smear 2017   LGSIL with HRHPV  . Sinusitis   . Wrist  fracture, closed    left    Family History: Family History  Problem Relation Age of Onset  . Diabetes Mother   . Kidney disease Mother   . Allergies Father   . Breast cancer Maternal Aunt 50  . Diabetes Maternal Grandfather   . Hypertension Maternal Grandfather     Social History   Socioeconomic History  . Marital status: Legally Separated    Spouse name: Not on file  . Number of children: 4  . Years of education: 62  . Highest education level: Not on file  Occupational History  . 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 Financial resource strain: Not on file  . Food insecurity:    Worry: Not on file    Inability: Not on file  . Transportation needs:    Medical: Not on file    Non-medical: Not on file  Tobacco Use  . Smoking status: Former Games developer  . Smokeless tobacco: Never Used  Substance and Sexual Activity  . Alcohol use: Yes    Alcohol/week: 4.2 oz    Types: 7 Glasses of wine per week  . Drug use: Never  . Sexual activity: Yes    Birth control/protection: IUD  Lifestyle  . Physical activity:    Days per week: 4 days    Minutes per session: 30 min  . Stress: Not at all  Relationships  . Social connections:    Talks on phone: Not on file    Gets together:  Not on file    Attends religious service: Not on file    Active member of club or organization: Not on file    Attends meetings of clubs or organizations: Not on file    Relationship status: Not on file  . Intimate partner violence:    Fear of current or ex partner: Not on file    Emotionally abused: Not on file    Physically abused: Not on file    Forced sexual activity: Not on file  Other Topics Concern  . Not on file  Social History Narrative  . Not on file      Review of Systems  Constitutional: Negative for activity change, chills, fatigue and unexpected weight change.  HENT: Negative for congestion, postnasal drip, rhinorrhea, sneezing and sore throat.   Eyes: Negative.   Negative for redness.  Respiratory: Negative for cough, chest tightness, shortness of breath and wheezing.   Cardiovascular: Negative for chest pain and palpitations.  Gastrointestinal: Negative for abdominal pain, constipation, diarrhea, nausea and vomiting.  Endocrine: Negative for cold intolerance, heat intolerance, polydipsia, polyphagia and polyuria.  Genitourinary: Negative for dysuria, frequency and vaginal discharge.  Musculoskeletal: Negative for arthralgias, back pain, joint swelling and neck pain.  Skin: Negative for rash.  Allergic/Immunologic: Positive for environmental allergies.  Neurological: Negative for tremors, numbness and headaches.  Hematological: Negative for adenopathy. Does not bruise/bleed easily.  Psychiatric/Behavioral: Positive for decreased concentration. Negative for behavioral problems (Depression), sleep disturbance and suicidal ideas. The patient is not nervous/anxious.     Vital Signs: BP 118/82   Pulse 75   Resp 16   Ht 5\' 4"  (1.626 m)   Wt 173 lb (78.5 kg)   SpO2 100%   BMI 29.70 kg/m    Physical Exam  Constitutional: She is oriented to person, place, and time. She appears well-developed and well-nourished. No distress.  HENT:  Head: Normocephalic and atraumatic.  Mouth/Throat: Oropharynx is clear and moist. No oropharyngeal exudate.  Eyes: Pupils are equal, round, and reactive to light. Conjunctivae and EOM are normal.  Neck: Normal range of motion. Neck supple. No JVD present. No tracheal deviation present. No thyromegaly present.  Cardiovascular: Normal rate, regular rhythm and normal heart sounds. Exam reveals no gallop and no friction rub.  No murmur heard. Pulmonary/Chest: Effort normal and breath sounds normal. No respiratory distress. She has no wheezes. She has no rales. She exhibits no tenderness.  Abdominal: Soft. Bowel sounds are normal. There is no tenderness.  Musculoskeletal: Normal range of motion.  Lymphadenopathy:    She has  no cervical adenopathy.  Neurological: She is alert and oriented to person, place, and time. No cranial nerve deficit.  Skin: Skin is warm and dry. She is not diaphoretic.  Psychiatric: She has a normal mood and affect. Her behavior is normal. Judgment and thought content normal.  Nursing note and vitals reviewed.  Assessment/Plan: 1. Seasonal allergic rhinitis due to pollen Continue allergy medication as prescribed and allergy injections as scheduled   2. Attention and concentration deficit May continue adderall 20mg  twice daily as needed for focus and concentration. Three 30 day prescriptions sent to her pharmacy. Dates are 10/28/2017, 11/25/2017, and 12/24/2017 - amphetamine-dextroamphetamine (ADDERALL) 20 MG tablet; Take 1 tablet (20 mg total) by mouth 2 (two) times daily.  Dispense: 60 tablet; Refill: 0  General Counseling: Maizie verbalizes understanding of the findings of todays visit and agrees with plan of treatment. I have discussed any further diagnostic evaluation that may be needed or ordered  today. We also reviewed her medications today. she has been encouraged to call the office with any questions or concerns that should arise related to todays visit.    Counseling:  This patient was seen by Vincent GrosHeather Tressie Ragin, FNP- C in Collaboration with Dr Lyndon CodeFozia M Khan as a part of collaborative care agreement  Meds ordered this encounter  Medications  . DISCONTD: amphetamine-dextroamphetamine (ADDERALL) 20 MG tablet    Sig: Take 1 tablet (20 mg total) by mouth 2 (two) times daily.    Dispense:  60 tablet    Refill:  0    Order Specific Question:   Supervising Provider    Answer:   Lyndon CodeKHAN, FOZIA M [[id number]]  . DISCONTD: amphetamine-dextroamphetamine (ADDERALL) 20 MG tablet    Sig: Take 1 tablet (20 mg total) by mouth 2 (two) times daily.    Dispense:  60 tablet    Refill:  0    Fill after 11/25/2017    Order Specific Question:   Supervising Provider    Answer:   Lyndon CodeKHAN, FOZIA M [[id number]]  .  amphetamine-dextroamphetamine (ADDERALL) 20 MG tablet    Sig: Take 1 tablet (20 mg total) by mouth 2 (two) times daily.    Dispense:  60 tablet    Refill:  0    Fill after 12/24/2017    Order Specific Question:   Supervising Provider    Answer:   Lyndon CodeKHAN, FOZIA M [[id number]]    Time spent: 3015 Minutes      Dr Lyndon CodeFozia M Khan Internal medicine

## 2017-11-11 ENCOUNTER — Other Ambulatory Visit: Payer: Self-pay | Admitting: Nurse Practitioner

## 2017-11-11 DIAGNOSIS — J0140 Acute pansinusitis, unspecified: Secondary | ICD-10-CM

## 2017-11-11 MED ORDER — AZITHROMYCIN 250 MG PO TABS: ORAL_TABLET | ORAL | 0 refills | Status: DC

## 2017-11-11 NOTE — Progress Notes (Signed)
Patient contacted office c/o sinus infection. [facility] [address]

## 2017-11-18 ENCOUNTER — Ambulatory Visit (INDEPENDENT_AMBULATORY_CARE_PROVIDER_SITE_OTHER): Payer: BLUE CROSS/BLUE SHIELD

## 2017-11-18 DIAGNOSIS — J301 Allergic rhinitis due to pollen: Secondary | ICD-10-CM

## 2017-12-02 ENCOUNTER — Other Ambulatory Visit: Payer: Self-pay

## 2017-12-02 ENCOUNTER — Ambulatory Visit (INDEPENDENT_AMBULATORY_CARE_PROVIDER_SITE_OTHER): Payer: BLUE CROSS/BLUE SHIELD

## 2017-12-02 DIAGNOSIS — J0140 Acute pansinusitis, unspecified: Secondary | ICD-10-CM

## 2017-12-02 DIAGNOSIS — J301 Allergic rhinitis due to pollen: Secondary | ICD-10-CM | POA: Diagnosis not present

## 2017-12-02 MED ORDER — AZITHROMYCIN 250 MG PO TABS: ORAL_TABLET | ORAL | 0 refills | Status: DC

## 2017-12-02 NOTE — Telephone Encounter (Signed)
Pt is going for trip and she had a sinus pressure she want zpak on hand as per heather send med

## 2017-12-04 DIAGNOSIS — J301 Allergic rhinitis due to pollen: Secondary | ICD-10-CM | POA: Diagnosis not present

## 2017-12-17 ENCOUNTER — Ambulatory Visit: Payer: Self-pay

## 2018-01-28 ENCOUNTER — Encounter: Payer: Self-pay | Admitting: Adult Health

## 2018-01-28 ENCOUNTER — Ambulatory Visit: Payer: BLUE CROSS/BLUE SHIELD | Admitting: Adult Health

## 2018-01-28 VITALS — BP 108/78 | HR 89 | Resp 16 | Ht 65.0 in | Wt 177.6 lb

## 2018-01-28 DIAGNOSIS — B379 Candidiasis, unspecified: Secondary | ICD-10-CM

## 2018-01-28 DIAGNOSIS — J0140 Acute pansinusitis, unspecified: Secondary | ICD-10-CM

## 2018-01-28 DIAGNOSIS — R41840 Attention and concentration deficit: Secondary | ICD-10-CM | POA: Diagnosis not present

## 2018-01-28 DIAGNOSIS — Z79899 Other long term (current) drug therapy: Secondary | ICD-10-CM | POA: Diagnosis not present

## 2018-01-28 DIAGNOSIS — Z23 Encounter for immunization: Secondary | ICD-10-CM

## 2018-01-28 LAB — POCT URINE DRUG SCREEN
POC AMPHETAMINE UR: POSITIVE — AB
POC BARBITURATE UR: NOT DETECTED
POC BENZODIAZEPINES UR: NOT DETECTED
POC COCAINE UR: NOT DETECTED
POC Ecstasy UR: NOT DETECTED
POC METHADONE UR: NOT DETECTED
POC Marijuana UR: NOT DETECTED
POC Methamphetamine UR: NOT DETECTED
POC Opiate Ur: NOT DETECTED
POC Oxycodone UR: NOT DETECTED
POC PHENCYCLIDINE UR: NOT DETECTED
POC TRICYCLICS UR: NOT DETECTED

## 2018-01-28 MED ORDER — AMPHETAMINE-DEXTROAMPHETAMINE 20 MG PO TABS: 20.0000 mg | ORAL_TABLET | Freq: Two times a day (BID) | ORAL | 0 refills | Status: DC

## 2018-01-28 MED ORDER — AZITHROMYCIN 250 MG PO TABS: ORAL_TABLET | ORAL | 0 refills | Status: DC

## 2018-01-28 MED ORDER — FLUCONAZOLE 150 MG PO TABS: ORAL_TABLET | ORAL | 2 refills | Status: DC

## 2018-01-28 NOTE — Progress Notes (Signed)
[facility] [address] [place], [place] [address]  Internal MEDICINE  Office Visit Note  Patient Name: Jamie Meadows  [id number]  [id number]  Date of Service: 02/05/2018  Chief Complaint  Patient presents with  . ADHD  . Allergies   HPI Pt here for follow up on ADHD and Allergies.  She reports she was very busy this summer with her kids and she didn't have time to have her allergy injections.  She would like to start taking those again.  Also, she takes 20 mg Adderall twice daily for concentration, and focus.  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   Current Medication: Outpatient Encounter Medications as of 01/28/2018  Medication Sig  . amphetamine-dextroamphetamine (ADDERALL) 20 MG tablet Take 1 tablet (20 mg total) by mouth 2 (two) times daily.  . fluticasone (FLONASE) 50 MCG/ACT nasal spray Place into both nostrils daily.  Marland Kitchen levonorgestrel (MIRENA) 20 MCG/24HR IUD 1 each by Intrauterine route once.  . loratadine (CLARITIN) 10 MG tablet Take 10 mg by mouth daily.  . Multiple Vitamin (MULTIVITAMIN) tablet Take 1 tablet by mouth daily.  Marland Kitchen amphetamine-dextroamphetamine (ADDERALL) 20 MG tablet Take 1 tablet (20 mg total) by mouth 2 (two) times daily.  Melene Muller ON 02/27/2018] amphetamine-dextroamphetamine (ADDERALL) 20 MG tablet Take 1 tablet (20 mg total) by mouth 2 (two) times daily.  Melene Muller ON 03/29/2018] amphetamine-dextroamphetamine (ADDERALL) 20 MG tablet Take 1 tablet (20 mg total) by mouth 2 (two) times daily.  Marland Kitchen azithromycin (ZITHROMAX) 250 MG tablet z-pack - take as directed for 5 days  . fluconazole (DIFLUCAN) 150 MG tablet Take one tablet by mouth.  May repeat every 3 days until symptoms resolve.  . [DISCONTINUED] azithromycin (ZITHROMAX) 250 MG tablet z-pack - take as directed for 5 days (Patient not taking: Reported on 01/28/2018)   No facility-administered encounter medications on file as of  01/28/2018.     Surgical History: Past Surgical History:  Procedure Laterality Date  . ABDOMINOPLASTY  2017  . COLPOSCOPY  09/21/2015  . WISDOM TOOTH EXTRACTION      Medical History: Past Medical History:  Diagnosis Date  . Allergic rhinitis   . Fracture of clavicle with routine healing right  . History of abnormal cervical Pap smear 2017   LGSIL with HRHPV  . Sinusitis   . Wrist fracture, closed    left    Family History: Family History  Problem Relation Age of Onset  . Diabetes Mother   . Kidney disease Mother   . Allergies Father   . Breast cancer Maternal Aunt 50  . Diabetes Maternal Grandfather   . Hypertension Maternal Grandfather     Social History   Socioeconomic History  . Marital status: Legally Separated    Spouse name: Not on file  . Number of children: 4  . Years of education: 21  . Highest education level: Not on file  Occupational History  . 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 Financial resource strain: Not on file  . Food insecurity:    Worry: Not on file    Inability: Not on file  . Transportation needs:    Medical: Not on file    Non-medical: Not on file  Tobacco Use  . Smoking status: Former Games developer  . Smokeless tobacco: Never Used  Substance and Sexual Activity  . Alcohol use: Yes    Alcohol/week: 7.0 standard drinks  Types: 7 Glasses of wine per week  . Drug use: Never  . Sexual activity: Yes    Birth control/protection: IUD  Lifestyle  . Physical activity:    Days per week: 4 days    Minutes per session: 30 min  . Stress: Not at all  Relationships  . Social connections:    Talks on phone: Not on file    Gets together: Not on file    Attends religious service: Not on file    Active member of club or organization: Not on file    Attends meetings of clubs or organizations: Not on file    Relationship status: Not on file  . Intimate partner violence:    Fear of current or ex partner: Not on file     Emotionally abused: Not on file    Physically abused: Not on file    Forced sexual activity: Not on file  Other Topics Concern  . Not on file  Social History Narrative  . Not on file   Review of Systems  Constitutional: Negative for chills, fatigue and unexpected weight change.  HENT: Negative for congestion, rhinorrhea, sneezing and sore throat.   Eyes: Negative for photophobia, pain and redness.  Respiratory: Negative for cough, chest tightness and shortness of breath.   Cardiovascular: Negative for chest pain and palpitations.  Gastrointestinal: Negative for abdominal pain, constipation, diarrhea, nausea and vomiting.  Endocrine: Negative.   Genitourinary: Negative for dysuria and frequency.  Musculoskeletal: Negative for arthralgias, back pain, joint swelling and neck pain.  Skin: Negative for rash.  Allergic/Immunologic: Negative.   Neurological: Negative for tremors and numbness.  Hematological: Negative for adenopathy. Does not bruise/bleed easily.  Psychiatric/Behavioral: Negative for behavioral problems and sleep disturbance. The patient is not nervous/anxious.    Vital Signs: BP 108/78   Pulse 89   Resp 16   Ht 5\' 5"  (1.651 m)   Wt 177 lb 9.6 oz (80.6 kg)   SpO2 97%   BMI 29.55 kg/m   Physical Exam  Constitutional: She is oriented to person, place, and time. She appears well-developed and well-nourished. No distress.  HENT:  Head: Normocephalic and atraumatic.  Mouth/Throat: Oropharynx is clear and moist. No oropharyngeal exudate.  Eyes: Pupils are equal, round, and reactive to light. EOM are normal.  Neck: Normal range of motion. Neck supple. No JVD present. No tracheal deviation present. No thyromegaly present.  Cardiovascular: Normal rate, regular rhythm and normal heart sounds. Exam reveals no gallop and no friction rub.  No murmur heard. Pulmonary/Chest: Effort normal and breath sounds normal. No respiratory distress. She has no wheezes. She has no rales. She  exhibits no tenderness.  Abdominal: Soft. There is no tenderness. There is no guarding.  Musculoskeletal: Normal range of motion.  Lymphadenopathy:    She has no cervical adenopathy.  Neurological: She is alert and oriented to person, place, and time. No cranial nerve deficit.  Skin: Skin is warm and dry. She is not diaphoretic.  Psychiatric: She has a normal mood and affect. Her behavior is normal. Judgment and thought content normal.  Nursing note and vitals reviewed.   Assessment/Plan: 1. Attention and concentration deficit Refilled patients Aderall.  Pt is doing well at this time.  - amphetamine-dextroamphetamine (ADDERALL) 20 MG tablet; Take 1 tablet (20 mg total) by mouth 2 (two) times daily.  Dispense: 60 tablet; Refill: 0 - amphetamine-dextroamphetamine (ADDERALL) 20 MG tablet; Take 1 tablet (20 mg total) by mouth 2 (two) times daily.  Dispense: 60  tablet; Refill: 0 - amphetamine-dextroamphetamine (ADDERALL) 20 MG tablet; Take 1 tablet (20 mg total) by mouth 2 (two) times daily.  Dispense: 60 tablet; Refill: 0  2. Encounter for long-term current use of high risk medication Positive for amphetamines as expected.  - POCT Urine Drug Screen  3. Candidiasis - fluconazole (DIFLUCAN) 150 MG tablet; Take one tablet by mouth.  May repeat every 3 days until symptoms resolve.  Dispense: 5 tablet; Refill: 2  4. Flu vaccine need - Flu Vaccine MDCK QUAD PF  General Counseling: Azarah verbalizes understanding of the findings of todays visit and agrees with plan of treatment. I have discussed any further diagnostic evaluation that may be needed or ordered today. We also reviewed her medications today. she has been encouraged to call the office with any questions or concerns that should arise related to todays visit.    Orders Placed This Encounter  Procedures  . Flu Vaccine MDCK QUAD PF  . POCT Urine Drug Screen    Meds ordered this encounter  Medications  . azithromycin (ZITHROMAX)  250 MG tablet    Sig: z-pack - take as directed for 5 days    Dispense:  6 tablet    Refill:  0  . fluconazole (DIFLUCAN) 150 MG tablet    Sig: Take one tablet by mouth.  May repeat every 3 days until symptoms resolve.    Dispense:  5 tablet    Refill:  2  . amphetamine-dextroamphetamine (ADDERALL) 20 MG tablet    Sig: Take 1 tablet (20 mg total) by mouth 2 (two) times daily.    Dispense:  60 tablet    Refill:  0  . amphetamine-dextroamphetamine (ADDERALL) 20 MG tablet    Sig: Take 1 tablet (20 mg total) by mouth 2 (two) times daily.    Dispense:  60 tablet    Refill:  0    Do not fill before 02/27/18 thank you  . amphetamine-dextroamphetamine (ADDERALL) 20 MG tablet    Sig: Take 1 tablet (20 mg total) by mouth 2 (two) times daily.    Dispense:  60 tablet    Refill:  0    Please do not fill before 03/29/18 Thank you   Time spent: 25 Minutes This patient was seen by Blima Ledger AGNP-C in Collaboration with Dr Lyndon Code as a part of collaborative care agreement    Dr Lyndon Code Internal medicine

## 2018-01-28 NOTE — Patient Instructions (Signed)
Amphetamine; Dextroamphetamine tablets What is this medicine? AMPHETAMINE; DEXTROAMPHETAMINE(am FET a meen; dex troe am FET a meen) is used to treat attention-deficit hyperactivity disorder (ADHD). It may also be used for narcolepsy. Federal law prohibits giving this medicine to any person other than the person for whom it was prescribed. Do not share this medicine with anyone else. This medicine may be used for other purposes; ask your health care provider or pharmacist if you have questions. COMMON BRAND NAME(S): Adderall What should I tell my health care provider before I take this medicine? They need to know if you have any of these conditions: -anxiety or panic attacks -circulation problems in fingers and toes -glaucoma -hardening or blockages of the arteries or heart blood vessels -heart disease or a heart defect -high blood pressure -history of a drug or alcohol abuse problem -history of stroke -kidney disease -liver disease -mental illness -seizures -suicidal thoughts, plans, or attempt; a previous suicide attempt by you or a family member -thyroid disease -Tourette's syndrome -an unusual or allergic reaction to dextroamphetamine, other amphetamines, other medicines, foods, dyes, or preservatives -pregnant or trying to get pregnant -breast-feeding How should I use this medicine? Take this medicine by mouth with a glass of water. Follow the directions on the prescription label. Take your doses at regular intervals. Do not take your medicine more often than directed. Do not suddenly stop your medicine. You must gradually reduce the dose or you may feel withdrawal effects. Ask your doctor or health care professional for advice. Talk to your pediatrician regarding the use of this medicine in children. Special care may be needed. While this drug may be prescribed for children as young as 3 years for selected conditions, precautions do apply. Overdosage: If you think you have taken too  much of this medicine contact a poison control center or emergency room at once. NOTE: This medicine is only for you. Do not share this medicine with others. What if I miss a dose? If you miss a dose, take it as soon as you can. If it is almost time for your next dose, take only that dose. Do not take double or extra doses. What may interact with this medicine? Do not take this medicine with any of the following medications: -MAOIS like Carbex, Eldepryl, Marplan, Nardil, and Parnate -other stimulant medicines for attention disorders, weight loss, or to stay awake This medicine may also interact with the following medications: -acetazolamide -ammonium chloride -antacids -ascorbic acid -atomoxetine -caffeine -certain medicines for blood pressure -certain medicines for depression, anxiety, or psychotic disturbances -certain medicines for seizures like carbamazepine, phenobarbital, phenytoin -certain medicines for stomach problems like cimetidine, famotidine, omeprazole, lansoprazole -cold or allergy medicines -glutamic acid -lithium -meperidine -methenamine; sodium acid phosphate -narcotic medicines for pain -norepinephrine -phenothiazines like chlorpromazine, mesoridazine, prochlorperazine, thioridazine -sodium acid phosphate -sodium bicarbonate This list may not describe all possible interactions. Give your health care provider a list of all the medicines, herbs, non-prescription drugs, or dietary supplements you use. Also tell them if you smoke, drink alcohol, or use illegal drugs. Some items may interact with your medicine. What should I watch for while using this medicine? Visit your doctor or health care professional for regular checks on your progress. This prescription requires that you follow special procedures with your doctor and pharmacy. You will need to have a new written prescription from your doctor every time you need a refill. This medicine may affect your  concentration, or hide signs of tiredness. Until you know how this   medicine affects you, do not drive, ride a bicycle, use machinery, or do anything that needs mental alertness. Tell your doctor or health care professional if this medicine loses its effects, or if you feel you need to take more than the prescribed amount. Do not change the dosage without talking to your doctor or health care professional. Decreased appetite is a common side effect when starting this medicine. Eating small, frequent meals or snacks can help. Talk to your doctor if you continue to have poor eating habits. Height and weight growth of a child taking this medicine will be monitored closely. Do not take this medicine close to bedtime. It may prevent you from sleeping. If you are going to need surgery, a MRI, CT scan, or other procedure, tell your doctor that you are taking this medicine. You may need to stop taking this medicine before the procedure. Tell your doctor or healthcare professional right away if you notice unexplained wounds on your fingers and toes while taking this medicine. You should also tell your healthcare provider if you experience numbness or pain, changes in the skin color, or sensitivity to temperature in your fingers or toes. What side effects may I notice from receiving this medicine? Side effects that you should report to your doctor or health care professional as soon as possible: -allergic reactions like skin rash, itching or hives, swelling of the face, lips, or tongue -changes in vision -chest pain or chest tightness -confusion, trouble speaking or understanding -fast, irregular heartbeat -fingers or toes feel numb, cool, painful -hallucination, loss of contact with reality -high blood pressure -males: prolonged or painful erection -seizures -severe headaches -shortness of breath -suicidal thoughts or other mood changes -trouble walking, dizziness, loss of balance or  coordination -uncontrollable head, mouth, neck, arm, or leg movements Side effects that usually do not require medical attention (report to your doctor or health care professional if they continue or are bothersome): -anxious -headache -loss of appetite -nausea, vomiting -trouble sleeping -weight loss This list may not describe all possible side effects. Call your doctor for medical advice about side effects. You may report side effects to FDA at 1-800-FDA-1088. Where should I keep my medicine? Keep out of the reach of children. This medicine can be abused. Keep your medicine in a safe place to protect it from theft. Do not share this medicine with anyone. Selling or giving away this medicine is dangerous and against the law. Store at room temperature between 15 and 30 degrees C (59 and 86 degrees F). Keep container tightly closed. Throw away any unused medicine after the expiration date. Dispose of properly. This medicine may cause accidental overdose and death if it is taken by other adults, children, or pets. Mix any unused medicine with a substance like cat litter or coffee grounds. Then throw the medicine away in a sealed container like a sealed bag or a coffee can with a lid. Do not use the medicine after the expiration date. NOTE: This sheet is a summary. It may not cover all possible information. If you have questions about this medicine, talk to your doctor, pharmacist, or health care provider.  2018 Elsevier/Gold Standard (2014-03-10 18:44:41)  

## 2018-01-29 ENCOUNTER — Ambulatory Visit (INDEPENDENT_AMBULATORY_CARE_PROVIDER_SITE_OTHER): Payer: BLUE CROSS/BLUE SHIELD

## 2018-01-29 DIAGNOSIS — J301 Allergic rhinitis due to pollen: Secondary | ICD-10-CM

## 2018-02-12 ENCOUNTER — Ambulatory Visit (INDEPENDENT_AMBULATORY_CARE_PROVIDER_SITE_OTHER): Payer: BLUE CROSS/BLUE SHIELD

## 2018-02-12 DIAGNOSIS — J301 Allergic rhinitis due to pollen: Secondary | ICD-10-CM | POA: Diagnosis not present

## 2018-02-24 ENCOUNTER — Ambulatory Visit (INDEPENDENT_AMBULATORY_CARE_PROVIDER_SITE_OTHER): Payer: BLUE CROSS/BLUE SHIELD

## 2018-02-24 DIAGNOSIS — J301 Allergic rhinitis due to pollen: Secondary | ICD-10-CM | POA: Diagnosis not present

## 2018-02-27 ENCOUNTER — Ambulatory Visit: Payer: Self-pay | Admitting: Internal Medicine

## 2018-03-10 ENCOUNTER — Ambulatory Visit (INDEPENDENT_AMBULATORY_CARE_PROVIDER_SITE_OTHER): Payer: BLUE CROSS/BLUE SHIELD

## 2018-03-10 DIAGNOSIS — J301 Allergic rhinitis due to pollen: Secondary | ICD-10-CM | POA: Diagnosis not present

## 2018-03-24 ENCOUNTER — Ambulatory Visit: Payer: Self-pay

## 2018-03-25 ENCOUNTER — Ambulatory Visit (INDEPENDENT_AMBULATORY_CARE_PROVIDER_SITE_OTHER): Payer: BLUE CROSS/BLUE SHIELD

## 2018-03-25 DIAGNOSIS — J301 Allergic rhinitis due to pollen: Secondary | ICD-10-CM

## 2018-03-26 ENCOUNTER — Ambulatory Visit: Payer: BLUE CROSS/BLUE SHIELD | Admitting: Adult Health

## 2018-03-26 ENCOUNTER — Encounter: Payer: Self-pay | Admitting: Adult Health

## 2018-03-26 VITALS — BP 118/92 | HR 85 | Temp 98.1°F | Resp 16 | Ht 65.0 in | Wt 183.0 lb

## 2018-03-26 DIAGNOSIS — R319 Hematuria, unspecified: Secondary | ICD-10-CM

## 2018-03-26 DIAGNOSIS — N390 Urinary tract infection, site not specified: Secondary | ICD-10-CM

## 2018-03-26 DIAGNOSIS — R300 Dysuria: Secondary | ICD-10-CM | POA: Diagnosis not present

## 2018-03-26 DIAGNOSIS — M791 Myalgia, unspecified site: Secondary | ICD-10-CM

## 2018-03-26 LAB — POCT URINALYSIS DIPSTICK
Bilirubin, UA: NEGATIVE
Glucose, UA: NEGATIVE
Ketones, UA: NEGATIVE
LEUKOCYTES UA: NEGATIVE
Nitrite, UA: NEGATIVE
PH UA: 5 (ref 5.0–8.0)
Protein, UA: NEGATIVE
Spec Grav, UA: 1.01 (ref 1.010–1.025)
UROBILINOGEN UA: 0.2 U/dL

## 2018-03-26 NOTE — Progress Notes (Signed)
[facility] [address] [place], [place] [address]  Internal MEDICINE  Office Visit Note  Patient Name: Jamie Meadows  [id number]  [id number]  Date of Service: 03/26/2018  Chief Complaint  Patient presents with  . Urinary Tract Infection    back pain ,med refills     HPI Pt is here for a sick visit. She reports back pain x 2 weeks.  She states she had a UTI in the past and it felt very similar to this.  She denies any urinary symptoms at this time.  No pain, no urgency, frequency or smell.  She describes his back pain is upper back pain just on her shoulder blades bilaterally.  She does not have any CVA tenderness.     Current Medication:  Outpatient Encounter Medications as of 03/26/2018  Medication Sig  . [START ON 03/29/2018] amphetamine-dextroamphetamine (ADDERALL) 20 MG tablet Take 1 tablet (20 mg total) by mouth 2 (two) times daily.  . fluticasone (FLONASE) 50 MCG/ACT nasal spray Place into both nostrils daily.  Marland Kitchen levonorgestrel (MIRENA) 20 MCG/24HR IUD 1 each by Intrauterine route once.  . loratadine (CLARITIN) 10 MG tablet Take 10 mg by mouth daily.  . Multiple Vitamin (MULTIVITAMIN) tablet Take 1 tablet by mouth daily.  . fluconazole (DIFLUCAN) 150 MG tablet Take one tablet by mouth.  May repeat every 3 days until symptoms resolve. (Patient not taking: Reported on 03/26/2018)  . [DISCONTINUED] amphetamine-dextroamphetamine (ADDERALL) 20 MG tablet Take 1 tablet (20 mg total) by mouth 2 (two) times daily. (Patient not taking: Reported on 03/26/2018)  . [DISCONTINUED] amphetamine-dextroamphetamine (ADDERALL) 20 MG tablet Take 1 tablet (20 mg total) by mouth 2 (two) times daily. (Patient not taking: Reported on 03/26/2018)  . [DISCONTINUED] amphetamine-dextroamphetamine (ADDERALL) 20 MG tablet Take 1 tablet (20 mg total) by mouth 2 (two) times daily. (Patient not taking: Reported on 03/26/2018)  . [DISCONTINUED] azithromycin (ZITHROMAX) 250 MG tablet z-pack -  take as directed for 5 days (Patient not taking: Reported on 03/26/2018)   No facility-administered encounter medications on file as of 03/26/2018.       Medical History: Past Medical History:  Diagnosis Date  . Allergic rhinitis   . Fracture of clavicle with routine healing right  . History of abnormal cervical Pap smear 2017   LGSIL with HRHPV  . Sinusitis   . Wrist fracture, closed    left     Vital Signs: BP (!) 118/92 (BP Location: Left Arm, Patient Position: Sitting, Cuff Size: Normal)   Pulse 85   Temp 98.1 F (36.7 C) (Oral)   Resp 16   Ht 5\' 5"  (1.651 m)   Wt 183 lb (83 kg)   SpO2 99%   BMI 30.45 kg/m    Review of Systems  Constitutional: Negative for chills, fatigue and unexpected weight change.  HENT: Negative for congestion, rhinorrhea, sneezing and sore throat.   Eyes: Negative for photophobia, pain and redness.  Respiratory: Negative for cough, chest tightness and shortness of breath.   Cardiovascular: Negative for chest pain and palpitations.  Gastrointestinal: Negative for abdominal pain, constipation, diarrhea, nausea and vomiting.  Endocrine: Negative.   Genitourinary: Negative for dysuria and frequency.  Musculoskeletal: Positive for back pain and myalgias. Negative for arthralgias, joint swelling and neck pain.  Skin: Negative for rash.  Allergic/Immunologic: Negative.   Neurological: Negative for tremors and numbness.  Hematological: Negative for adenopathy. Does not bruise/bleed easily.  Psychiatric/Behavioral: Negative for behavioral problems and sleep disturbance. The patient is not  nervous/anxious.     Physical Exam  Constitutional: She is oriented to person, place, and time. She appears well-developed and well-nourished. No distress.  HENT:  Head: Normocephalic and atraumatic.  Mouth/Throat: Oropharynx is clear and moist. No oropharyngeal exudate.  Eyes: Pupils are equal, round, and reactive to light. EOM are normal.  Neck: Normal range  of motion. Neck supple. No JVD present. No tracheal deviation present. No thyromegaly present.  Cardiovascular: Normal rate, regular rhythm and normal heart sounds. Exam reveals no gallop and no friction rub.  No murmur heard. Pulmonary/Chest: Effort normal and breath sounds normal. No respiratory distress. She has no wheezes. She has no rales. She exhibits no tenderness.  Abdominal: Soft. There is no tenderness. There is no guarding.  Musculoskeletal: Normal range of motion.  Lymphadenopathy:    She has no cervical adenopathy.  Neurological: She is alert and oriented to person, place, and time. No cranial nerve deficit.  Skin: Skin is warm and dry. She is not diaphoretic.  Psychiatric: She has a normal mood and affect. Her behavior is normal. Judgment and thought content normal.  Nursing note and vitals reviewed.  Assessment/Plan: 1. Muscle ache Pain of her back most likely due to muscle strain from exercise or twisting motion.  Encourage patient to use topical medications like icy hot.  Also encouraged her to use hot pack for 20 minutes on 2 hours off.  She can take ibuprofen or Aleve for anti-inflammatory relief.  If pain gets worse or new symptoms develop she should return to clinic.  2. Urinary tract infection with hematuria, site unspecified Trace amounts of blood noted on urine dip in office will send urine for culture.  Otherwise urine dip was normal we will wait to treat to see if culture grows anything. - CULTURE, URINE COMPREHENSIVE  3. Dysuria - POCT Urinalysis Dipstick  General Counseling: Jashiya verbalizes understanding of the findings of todays visit and agrees with plan of treatment. I have discussed any further diagnostic evaluation that may be needed or ordered today. We also reviewed her medications today. she has been encouraged to call the office with any questions or concerns that should arise related to todays visit.   Orders Placed This Encounter  Procedures  .  CULTURE, URINE COMPREHENSIVE  . POCT Urinalysis Dipstick    No orders of the defined types were placed in this encounter.   Time spent: 25 Minutes  This patient was seen by Blima Ledger AGNP-C in Collaboration with Dr Lyndon Code as a part of collaborative care agreement.  Johnna Acosta AGNP-C Internal Medicine

## 2018-03-26 NOTE — Patient Instructions (Signed)

## 2018-03-29 LAB — CULTURE, URINE COMPREHENSIVE

## 2018-04-08 ENCOUNTER — Ambulatory Visit: Payer: Self-pay

## 2018-04-29 ENCOUNTER — Ambulatory Visit: Payer: Self-pay | Admitting: Nurse Practitioner

## 2018-05-01 ENCOUNTER — Ambulatory Visit (INDEPENDENT_AMBULATORY_CARE_PROVIDER_SITE_OTHER): Payer: BLUE CROSS/BLUE SHIELD | Admitting: Nurse Practitioner

## 2018-05-01 ENCOUNTER — Encounter: Payer: Self-pay | Admitting: Nurse Practitioner

## 2018-05-01 VITALS — BP 122/76 | HR 85 | Resp 16 | Ht 64.0 in | Wt 178.6 lb

## 2018-05-01 DIAGNOSIS — R41840 Attention and concentration deficit: Secondary | ICD-10-CM

## 2018-05-01 DIAGNOSIS — J301 Allergic rhinitis due to pollen: Secondary | ICD-10-CM | POA: Diagnosis not present

## 2018-05-01 MED ORDER — AMPHETAMINE-DEXTROAMPHETAMINE 20 MG PO TABS: 20.0000 mg | ORAL_TABLET | Freq: Two times a day (BID) | ORAL | 0 refills | Status: DC

## 2018-05-01 NOTE — Progress Notes (Signed)
[facility] [address] [place], [place] [address]  Internal MEDICINE  Office Visit Note  Patient Name: Jamie Meadows  [id number]  [id number]  Date of Service: 05/01/2018  Chief Complaint  Patient presents with  . Medical Management of Chronic Issues    3 month follow up     She is currently taking adderall 20mg  twice daily to help with focus and concentration. She has high demand job and has four young children. She takes adderall to help keep her focused and on track at work and at home. She has no negative side effects associated with taking ths medication. She needs to have refills for this medication.       Current Medication: Outpatient Encounter Medications as of 05/01/2018  Medication Sig  . amphetamine-dextroamphetamine (ADDERALL) 20 MG tablet Take 1 tablet (20 mg total) by mouth 2 (two) times daily.  . fluconazole (DIFLUCAN) 150 MG tablet Take one tablet by mouth.  May repeat every 3 days until symptoms resolve.  . fluticasone (FLONASE) 50 MCG/ACT nasal spray Place into both nostrils daily.  Marland Kitchen levonorgestrel (MIRENA) 20 MCG/24HR IUD 1 each by Intrauterine route once.  . loratadine (CLARITIN) 10 MG tablet Take 10 mg by mouth daily.  . Multiple Vitamin (MULTIVITAMIN) tablet Take 1 tablet by mouth daily.  . [DISCONTINUED] amphetamine-dextroamphetamine (ADDERALL) 20 MG tablet Take 1 tablet (20 mg total) by mouth 2 (two) times daily.  . [DISCONTINUED] amphetamine-dextroamphetamine (ADDERALL) 20 MG tablet Take 1 tablet (20 mg total) by mouth 2 (two) times daily.  . [DISCONTINUED] amphetamine-dextroamphetamine (ADDERALL) 20 MG tablet Take 1 tablet (20 mg total) by mouth 2 (two) times daily.  . [DISCONTINUED] amphetamine-dextroamphetamine (ADDERALL) 20 MG tablet Take 1 tablet (20 mg total) by mouth 2 (two) times daily.   No facility-administered encounter medications on file as of 05/01/2018.     Surgical History: Past Surgical History:  Procedure  Laterality Date  . ABDOMINOPLASTY  2017  . COLPOSCOPY  09/21/2015  . WISDOM TOOTH EXTRACTION      Medical History: Past Medical History:  Diagnosis Date  . Allergic rhinitis   . Fracture of clavicle with routine healing right  . History of abnormal cervical Pap smear 2017   LGSIL with HRHPV  . Sinusitis   . Wrist fracture, closed    left    Family History: Family History  Problem Relation Age of Onset  . Diabetes Mother   . Kidney disease Mother   . Allergies Father   . Breast cancer Maternal Aunt 50  . Diabetes Maternal Grandfather   . Hypertension Maternal Grandfather     Social History   Socioeconomic History  . Marital status: Legally Separated    Spouse name: Not on file  . Number of children: 4  . Years of education: 43  . Highest education level: Not on file  Occupational History  . 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 Financial resource strain: Not on file  . Food insecurity:    Worry: Not on file    Inability: Not on file  . Transportation needs:    Medical: Not on file    Non-medical: Not on file  Tobacco Use  . Smoking status: Former Games developer  . Smokeless tobacco: Never Used  Substance and Sexual Activity  . Alcohol use: Not Currently    Alcohol/week: 7.0 standard drinks    Types: 7 Glasses of wine per week    Comment: has not had a drink in 2wks  .  Drug use: Never  . Sexual activity: Yes    Birth control/protection: I.U.D.  Lifestyle  . Physical activity:    Days per week: 4 days    Minutes per session: 30 min  . Stress: Not at all  Relationships  . Social connections:    Talks on phone: Not on file    Gets together: Not on file    Attends religious service: Not on file    Active member of club or organization: Not on file    Attends meetings of clubs or organizations: Not on file    Relationship status: Not on file  . Intimate partner violence:    Fear of current or ex partner: Not on file    Emotionally abused: Not on  file    Physically abused: Not on file    Forced sexual activity: Not on file  Other Topics Concern  . Not on file  Social History Narrative  . Not on file      Review of Systems  Constitutional: Negative for activity change, chills, fatigue and unexpected weight change.       Weight loss of 5 pounds since her last visit. Has joined [employer] and is doing well.   HENT: Negative for congestion, postnasal drip, rhinorrhea, sneezing and sore throat.   Eyes: Negative.   Respiratory: Negative for cough, chest tightness, shortness of breath and wheezing.   Cardiovascular: Negative for chest pain and palpitations.  Gastrointestinal: Negative for abdominal pain, constipation, diarrhea, nausea and vomiting.  Endocrine: Negative for cold intolerance, heat intolerance, polydipsia, polyphagia and polyuria.  Genitourinary: Negative for vaginal discharge.  Musculoskeletal: Negative for arthralgias, back pain, joint swelling and neck pain.  Skin: Negative for rash.  Allergic/Immunologic: Positive for environmental allergies.  Neurological: Negative for tremors, numbness and headaches.  Hematological: Negative for adenopathy. Does not bruise/bleed easily.  Psychiatric/Behavioral: Positive for decreased concentration. Negative for behavioral problems (Depression), sleep disturbance and suicidal ideas. The patient is not nervous/anxious.     Today's Vitals   05/01/18 0955  BP: 122/76  Pulse: 85  Resp: 16  SpO2: 100%  Weight: 178 lb 9.6 oz (81 kg)  Height: 5\' 4"  (1.626 m)    Physical Exam Vitals signs and nursing note reviewed.  Constitutional:      General: She is not in acute distress.    Appearance: Normal appearance. She is well-developed. She is not diaphoretic.  HENT:     Head: Normocephalic and atraumatic.     Nose: Nose normal.     Mouth/Throat:     Pharynx: No oropharyngeal exudate.  Eyes:     Conjunctiva/sclera: Conjunctivae normal.     Pupils: Pupils are equal, round,  and reactive to light.  Neck:     Musculoskeletal: Normal range of motion and neck supple.     Thyroid: No thyromegaly.     Vascular: No JVD.     Trachea: No tracheal deviation.  Cardiovascular:     Rate and Rhythm: Normal rate and regular rhythm.     Heart sounds: Normal heart sounds. No murmur. No friction rub. No gallop.   Pulmonary:     Effort: Pulmonary effort is normal. No respiratory distress.     Breath sounds: Normal breath sounds. No wheezing or rales.  Chest:     Chest wall: No tenderness.  Abdominal:     Tenderness: There is no abdominal tenderness.  Musculoskeletal: Normal range of motion.  Lymphadenopathy:     Cervical: No cervical adenopathy.  Skin:  General: Skin is warm and dry.  Neurological:     Mental Status: She is alert and oriented to person, place, and time.     Cranial Nerves: No cranial nerve deficit.  Psychiatric:        Mood and Affect: Mood normal.        Behavior: Behavior normal.        Thought Content: Thought content normal.        Judgment: Judgment normal.   Assessment/Plan: 1. Seasonal allergic rhinitis due to pollen Continue to use flonase and claritin as needed.   2. Attention and concentration deficit May continue adderall 20mg  twice daily as needed. Three 30 day prescriptions sent to her pharmacy. Dates are 05/01/2018, 05/30/2018, and 06/28/2018 - amphetamine-dextroamphetamine (ADDERALL) 20 MG tablet; Take 1 tablet (20 mg total) by mouth 2 (two) times daily.  Dispense: 60 tablet; Refill: 0  General Counseling: Ludie verbalizes understanding of the findings of todays visit and agrees with plan of treatment. I have discussed any further diagnostic evaluation that may be needed or ordered today. We also reviewed her medications today. she has been encouraged to call the office with any questions or concerns that should arise related to todays visit.   Refilled Controlled medications today. Reviewed risks and possible side effects  associated with taking Stimulants. Combination of these drugs with other psychotropic medications could cause dizziness and drowsiness. Pt needs to Monitor symptoms and exercise caution in driving and operating heavy machinery to avoid damages to oneself, to others and to the surroundings. Patient verbalized understanding in this matter. Dependence and abuse for these drugs will be monitored closely. A Controlled substance policy and procedure is on file which allows [facility] to order a urine drug screen test at any visit. Patient understands and agrees with the plan..  This patient was seen by Vincent Gros FNP Collaboration with Dr Lyndon Code as a part of collaborative care agreement  Meds ordered this encounter  Medications  . DISCONTD: amphetamine-dextroamphetamine (ADDERALL) 20 MG tablet    Sig: Take 1 tablet (20 mg total) by mouth 2 (two) times daily.    Dispense:  60 tablet    Refill:  0    Please do not fill before 03/29/18 Thank you    Order Specific Question:   Supervising Provider    Answer:   Lyndon Code [[id number]]  . DISCONTD: amphetamine-dextroamphetamine (ADDERALL) 20 MG tablet    Sig: Take 1 tablet (20 mg total) by mouth 2 (two) times daily.    Dispense:  60 tablet    Refill:  0    Please disregard initial prescription.    Order Specific Question:   Supervising Provider    Answer:   Lyndon Code [[id number]]  . DISCONTD: amphetamine-dextroamphetamine (ADDERALL) 20 MG tablet    Sig: Take 1 tablet (20 mg total) by mouth 2 (two) times daily.    Dispense:  60 tablet    Refill:  0    Please fill after 05/30/2018    Order Specific Question:   Supervising Provider    Answer:   Lyndon Code [[id number]]  . amphetamine-dextroamphetamine (ADDERALL) 20 MG tablet    Sig: Take 1 tablet (20 mg total) by mouth 2 (two) times daily.    Dispense:  60 tablet    Refill:  0    Please fill after 06/28/2018    Order Specific Question:   Supervising Provider    Answer:   Lyndon Code  [[id number]]  Time spent: 13 Minutes      Dr Lavera Guise Internal medicine

## 2018-05-08 ENCOUNTER — Telehealth: Payer: Self-pay | Admitting: Nurse Practitioner

## 2018-05-08 NOTE — Telephone Encounter (Signed)
LEFT MESSAGE TO RESCHEDULE 08/01/18 [email or web address].JW

## 2018-05-16 ENCOUNTER — Telehealth: Payer: Self-pay

## 2018-05-16 NOTE — Telephone Encounter (Signed)
PT CALLED SAYING HER SON HAD STREP THROAT AND NOW SHE HAS THE SAME SYMPTOMS. [place] THE [place] AND SHE WANTED TO KNOW IF A RX FOR STREP CAN BE SENT TO A PHARMACY [place] OR SENT BY EMAIL TO GET THE RX FILLED THERE. SPOKE WITH NIMISHA AND THERE IS NO WAY TO DO THAT AND NIMISHA TOLD ME TO ADVISE HER THAT SHE CAN GARGLE WITH WARM SALT WATER TO EASE THE SYMPTOMS. I ADVISED HER TO DO SO AND NOTIFIED HER THAT WE ARE NOT ABLE TO SEND IN AN RX.

## 2018-05-29 ENCOUNTER — Other Ambulatory Visit: Payer: Self-pay | Admitting: Adult Health

## 2018-05-29 DIAGNOSIS — J0140 Acute pansinusitis, unspecified: Secondary | ICD-10-CM

## 2018-05-30 ENCOUNTER — Other Ambulatory Visit: Payer: Self-pay

## 2018-05-30 ENCOUNTER — Encounter: Payer: Self-pay | Admitting: Nurse Practitioner

## 2018-05-30 ENCOUNTER — Ambulatory Visit: Payer: BLUE CROSS/BLUE SHIELD | Admitting: Nurse Practitioner

## 2018-05-30 VITALS — BP 118/75 | HR 80 | Temp 97.7°F | Resp 16 | Ht 64.0 in | Wt 176.0 lb

## 2018-05-30 DIAGNOSIS — J329 Chronic sinusitis, unspecified: Secondary | ICD-10-CM | POA: Diagnosis not present

## 2018-05-30 DIAGNOSIS — B379 Candidiasis, unspecified: Secondary | ICD-10-CM | POA: Diagnosis not present

## 2018-05-30 MED ORDER — LORATADINE 10 MG PO TABS: 10.0000 mg | ORAL_TABLET | Freq: Every day | ORAL | 5 refills | Status: DC

## 2018-05-30 MED ORDER — AZITHROMYCIN 250 MG PO TABS: ORAL_TABLET | ORAL | 2 refills | Status: DC

## 2018-05-30 MED ORDER — FLUCONAZOLE 150 MG PO TABS: ORAL_TABLET | ORAL | 2 refills | Status: DC

## 2018-05-30 NOTE — Progress Notes (Signed)
[name] [address] [place], [place] [address]  Internal MEDICINE  Office Visit Note  Patient Name: Jamie Meadows  [id number]  [id number]  Date of Service: 06/04/2018   Pt is here for a sick visit.  Chief Complaint  Patient presents with  . Sinusitis     Sinusitis  This is a recurrent problem. The current episode started in the past 7 days. The problem is unchanged. There has been no fever. Associated symptoms include chills, congestion, coughing, diaphoresis, headaches, sinus pressure, a sore throat and swollen glands. Pertinent negatives include no neck pain, shortness of breath or sneezing. Past treatments include acetaminophen and saline sprays. The treatment provided mild relief.        Current Medication:  Outpatient Encounter Medications as of 05/30/2018  Medication Sig  . amphetamine-dextroamphetamine (ADDERALL) 20 MG tablet Take 1 tablet (20 mg total) by mouth 2 (two) times daily.  . fluconazole (DIFLUCAN) 150 MG tablet Take one tablet by mouth.  May repeat every 3 days until symptoms resolve.  . fluticasone (FLONASE) 50 MCG/ACT nasal spray Place into both nostrils daily.  Marland Kitchen. levonorgestrel (MIRENA) 20 MCG/24HR IUD 1 each by Intrauterine route once.  . loratadine (CLARITIN) 10 MG tablet Take 1 tablet (10 mg total) by mouth daily.  . Multiple Vitamin (MULTIVITAMIN) tablet Take 1 tablet by mouth daily.  . [DISCONTINUED] fluconazole (DIFLUCAN) 150 MG tablet Take one tablet by mouth.  May repeat every 3 days until symptoms resolve.  Marland Kitchen. azithromycin (ZITHROMAX) 250 MG tablet z-pack - take as directed for 5 days   No facility-administered encounter medications on file as of 05/30/2018.       Medical History: Past Medical History:  Diagnosis Date  . Allergic rhinitis   . Fracture of clavicle with routine healing right  . History of abnormal cervical Pap smear 2017   LGSIL with HRHPV  . Sinusitis   . Wrist fracture, closed    left     Today's Vitals   05/30/18 1410  BP: 118/75  Pulse: 80  Resp: 16  Temp: 97.7 F (36.5 C)  SpO2: 100%  Weight: 176 lb (79.8 kg)  Height: 5\' 4"  (1.626 m)    Review of Systems  Constitutional: Positive for chills, diaphoresis and fatigue. Negative for fever and unexpected weight change.  HENT: Positive for congestion, postnasal drip, rhinorrhea, sinus pressure, sinus pain and sore throat. Negative for sneezing.   Respiratory: Positive for cough. Negative for chest tightness, shortness of breath and wheezing.   Cardiovascular: Negative for chest pain and palpitations.  Gastrointestinal: Negative for abdominal pain, constipation, diarrhea, nausea and vomiting.  Musculoskeletal: Negative for arthralgias, back pain, joint swelling and neck pain.  Skin: Negative for rash.  Allergic/Immunologic: Positive for environmental allergies.  Neurological: Positive for headaches. Negative for tremors and numbness.  Hematological: Positive for adenopathy. Does not bruise/bleed easily.  Psychiatric/Behavioral: Negative for behavioral problems (Depression), sleep disturbance and suicidal ideas. The patient is not nervous/anxious.     Physical Exam Vitals signs and nursing note reviewed.  Constitutional:      General: She is not in acute distress.    Appearance: She is well-developed. She is ill-appearing. She is not diaphoretic.  HENT:     Head: Normocephalic and atraumatic.     Right Ear: Tympanic membrane is erythematous and bulging.     Left Ear: Tympanic membrane is erythematous and bulging.     Nose: Congestion present.     Right Sinus: Frontal sinus tenderness present.  Left Sinus: Frontal sinus tenderness present.     Mouth/Throat:     Pharynx: Posterior oropharyngeal erythema present. No oropharyngeal exudate.  Eyes:     Pupils: Pupils are equal, round, and reactive to light.  Neck:     Musculoskeletal: Normal range of motion and neck supple.     Thyroid: No thyromegaly.      Vascular: No JVD.     Trachea: No tracheal deviation.  Cardiovascular:     Rate and Rhythm: Normal rate and regular rhythm.     Heart sounds: Normal heart sounds. No murmur. No friction rub. No gallop.   Pulmonary:     Effort: Pulmonary effort is normal. No respiratory distress.     Breath sounds: Normal breath sounds. No wheezing or rales.  Chest:     Chest wall: No tenderness.  Abdominal:     General: Bowel sounds are normal.     Palpations: Abdomen is soft.  Musculoskeletal: Normal range of motion.  Lymphadenopathy:     Cervical: Cervical adenopathy present.  Skin:    General: Skin is warm and dry.  Neurological:     Mental Status: She is alert and oriented to person, place, and time.     Cranial Nerves: No cranial nerve deficit.  Psychiatric:        Behavior: Behavior normal.        Thought Content: Thought content normal.        Judgment: Judgment normal.   Assessment/Plan: 1. Recurrent sinusitis Start z-pack. Take as directed for 5 days. Rest and increase fluids. Use OTC medications as needed and as indicated to improve symptoms.  - azithromycin (ZITHROMAX) 250 MG tablet; z-pack - take as directed for 5 days  Dispense: 6 tablet; Refill: 2  2. Candidiasis Use diflucan as needed and as indicated for yeast infection.  - fluconazole (DIFLUCAN) 150 MG tablet; Take one tablet by mouth.  May repeat every 3 days until symptoms resolve.  Dispense: 5 tablet; Refill: 2  General Counseling: Jamie Meadows verbalizes understanding of the findings of todays visit and agrees with plan of treatment. I have discussed any further diagnostic evaluation that may be needed or ordered today. We also reviewed her medications today. she has been encouraged to call the office with any questions or concerns that should arise related to todays visit.    Counseling:  Rest and increase fluids. Continue using OTC medication to control symptoms.   Meds ordered this encounter  Medications  .  azithromycin (ZITHROMAX) 250 MG tablet    Sig: z-pack - take as directed for 5 days    Dispense:  6 tablet    Refill:  2    Order Specific Question:   Supervising Provider    Answer:   Lyndon CodeKHAN, FOZIA M [[id number]]  . fluconazole (DIFLUCAN) 150 MG tablet    Sig: Take one tablet by mouth.  May repeat every 3 days until symptoms resolve.    Dispense:  5 tablet    Refill:  2    Order Specific Question:   Supervising Provider    Answer:   Lyndon CodeKHAN, FOZIA M [[id number]]    Time spent: 15 Minutes

## 2018-06-04 DIAGNOSIS — J329 Chronic sinusitis, unspecified: Secondary | ICD-10-CM | POA: Insufficient documentation

## 2018-08-01 ENCOUNTER — Encounter: Payer: Self-pay | Admitting: Adult Health

## 2018-08-01 ENCOUNTER — Other Ambulatory Visit: Payer: Self-pay

## 2018-08-01 ENCOUNTER — Ambulatory Visit: Payer: BLUE CROSS/BLUE SHIELD | Admitting: Adult Health

## 2018-08-01 VITALS — BP 104/77 | HR 76 | Resp 16 | Ht 64.0 in | Wt 171.0 lb

## 2018-08-01 DIAGNOSIS — J301 Allergic rhinitis due to pollen: Secondary | ICD-10-CM

## 2018-08-01 DIAGNOSIS — R41840 Attention and concentration deficit: Secondary | ICD-10-CM

## 2018-08-01 DIAGNOSIS — Z79899 Other long term (current) drug therapy: Secondary | ICD-10-CM

## 2018-08-01 LAB — POCT URINE DRUG SCREEN
POC Amphetamine UR: POSITIVE — AB
POC BENZODIAZEPINES UR: NOT DETECTED
POC Barbiturate UR: NOT DETECTED
POC Cocaine UR: NOT DETECTED
POC Ecstasy UR: NOT DETECTED
POC METHAMPHETAMINE UR: NOT DETECTED
POC Marijuana UR: NOT DETECTED
POC Methadone UR: NOT DETECTED
POC Opiate Ur: NOT DETECTED
POC Oxycodone UR: NOT DETECTED
POC PHENCYCLIDINE UR: NOT DETECTED
POC TRICYCLICS UR: NOT DETECTED

## 2018-08-01 MED ORDER — AMPHETAMINE-DEXTROAMPHETAMINE 20 MG PO TABS: 20.0000 mg | ORAL_TABLET | Freq: Two times a day (BID) | ORAL | 0 refills | Status: DC

## 2018-08-01 NOTE — Patient Instructions (Signed)
Amphetamine; Dextroamphetamine tablets  What is this medicine?  AMPHETAMINE; DEXTROAMPHETAMINE(am FET a meen; dex troe am FET a meen) is used to treat attention-deficit hyperactivity disorder (ADHD). It may also be used for narcolepsy. Federal law prohibits giving this medicine to any person other than the person for whom it was prescribed. Do not share this medicine with anyone else.  This medicine may be used for other purposes; ask your health care provider or pharmacist if you have questions.  COMMON BRAND NAME(S): Adderall  What should I tell my health care provider before I take this medicine?  They need to know if you have any of these conditions:  -anxiety or panic attacks  -circulation problems in fingers and toes  -glaucoma  -hardening or blockages of the arteries or heart blood vessels  -heart disease or a heart defect  -high blood pressure  -history of a drug or alcohol abuse problem  -history of stroke  -kidney disease  -liver disease  -mental illness  -seizures  -suicidal thoughts, plans, or attempt; a previous suicide attempt by you or a family member  -thyroid disease  -Tourette's syndrome  -an unusual or allergic reaction to dextroamphetamine, other amphetamines, other medicines, foods, dyes, or preservatives  -pregnant or trying to get pregnant  -breast-feeding  How should I use this medicine?  Take this medicine by mouth with a glass of water. Follow the directions on the prescription label. Take your doses at regular intervals. Do not take your medicine more often than directed. Do not suddenly stop your medicine. You must gradually reduce the dose or you may feel withdrawal effects. Ask your doctor or health care professional for advice.  Talk to your pediatrician regarding the use of this medicine in children. Special care may be needed. While this drug may be prescribed for children as young as 3 years for selected conditions, precautions do apply.  Overdosage: If you think you have taken too  much of this medicine contact a poison control center or emergency room at once.  NOTE: This medicine is only for you. Do not share this medicine with others.  What if I miss a dose?  If you miss a dose, take it as soon as you can. If it is almost time for your next dose, take only that dose. Do not take double or extra doses.  What may interact with this medicine?  Do not take this medicine with any of the following medications:  -MAOIs like Carbex, Eldepryl, Marplan, Nardil, and Parnate  -other stimulant medicines for attention disorders  This medicine may also interact with the following medications:  -acetazolamide  -ammonium chloride  -antacids  -ascorbic acid  -atomoxetine  -caffeine  -certain medicines for blood pressure  -certain medicines for depression, anxiety, or psychotic disturbances  -certain medicines for seizures like carbamazepine, phenobarbital, phenytoin  -certain medicines for stomach problems like cimetidine, ranitidine, famotidine, esomeprazole, omeprazole, lansoprazole, pantoprazole  -lithium  -medicines for colds and breathing difficulties  -medicines for diabetes  -medicines or dietary supplements for weight loss or to stay awake  -methenamine  -narcotic medicines for pain  -quinidine  -ritonavir  -sodium bicarbonate  -St. John's wort  This list may not describe all possible interactions. Give your health care provider a list of all the medicines, herbs, non-prescription drugs, or dietary supplements you use. Also tell them if you smoke, drink alcohol, or use illegal drugs. Some items may interact with your medicine.  What should I watch for while using this medicine?    Visit your doctor or health care professional for regular checks on your progress. This prescription requires that you follow special procedures with your doctor and pharmacy. You will need to have a new written prescription from your doctor every time you need a refill.  This medicine may affect your concentration, or hide  signs of tiredness. Until you know how this medicine affects you, do not drive, ride a bicycle, use machinery, or do anything that needs mental alertness.  Tell your doctor or health care professional if this medicine loses its effects, or if you feel you need to take more than the prescribed amount. Do not change the dosage without talking to your doctor or health care professional.  Decreased appetite is a common side effect when starting this medicine. Eating small, frequent meals or snacks can help. Talk to your doctor if you continue to have poor eating habits. Height and weight growth of a child taking this medicine will be monitored closely.  Do not take this medicine close to bedtime. It may prevent you from sleeping.  If you are going to need surgery, a MRI, CT scan, or other procedure, tell your doctor that you are taking this medicine. You may need to stop taking this medicine before the procedure.  Tell your doctor or healthcare professional right away if you notice unexplained wounds on your fingers and toes while taking this medicine. You should also tell your healthcare provider if you experience numbness or pain, changes in the skin color, or sensitivity to temperature in your fingers or toes.  What side effects may I notice from receiving this medicine?  Side effects that you should report to your doctor or health care professional as soon as possible:  -allergic reactions like skin rash, itching or hives, swelling of the face, lips, or tongue  -anxious  -breathing problems  -changes in emotions or moods  -changes in vision  -chest pain or chest tightness  -fast, irregular heartbeat  -fingers or toes feel numb, cool, painful  -hallucination, loss of contact with reality  -high blood pressure  -males: prolonged or painful erection  -seizures  -signs and symptoms of serotonin syndrome like confusion, increased sweating, fever, tremor, stiff muscles, diarrhea  -signs and symptoms of a stroke like  changes in vision; confusion; trouble speaking or understanding; severe headaches; sudden numbness or weakness of the face, arm or leg; trouble walking; dizziness; loss of balance or coordination  -suicidal thoughts or other mood changes  -uncontrollable head, mouth, neck, arm, or leg movements  Side effects that usually do not require medical attention (report to your doctor or health care professional if they continue or are bothersome):  -dry mouth  -headache  -irritability  -loss of appetite  -nausea  -trouble sleeping  -weight loss  This list may not describe all possible side effects. Call your doctor for medical advice about side effects. You may report side effects to FDA at 1-800-FDA-1088.  Where should I keep my medicine?  Keep out of the reach of children. This medicine can be abused. Keep your medicine in a safe place to protect it from theft. Do not share this medicine with anyone. Selling or giving away this medicine is dangerous and against the law.  Store at room temperature between 15 and 30 degrees C (59 and 86 degrees F). Keep container tightly closed. Throw away any unused medicine after the expiration date. Dispose of properly. This medicine may cause accidental overdose and death if it is taken by   other adults, children, or pets. Mix any unused medicine with a substance like cat litter or coffee grounds. Then throw the medicine away in a sealed container like a sealed bag or a coffee can with a lid. Do not use the medicine after the expiration date.  NOTE: This sheet is a summary. It may not cover all possible information. If you have questions about this medicine, talk to your doctor, pharmacist, or health care provider.   2019 Elsevier/Gold Standard (2016-06-29 16:28:23)

## 2018-08-01 NOTE — Progress Notes (Signed)
[facility] [address] [place], [place] [address]  Internal MEDICINE  Office Visit Note  Patient Name: Jamie Meadows  [id number]  [id number]  Date of Service: 08/01/2018  Chief Complaint  Patient presents with  . ADD    HPI  Pt is here for follow up on ADD. She reports she is doing well.  Pt denies side effects of medications.  She denies chest pain, palpitations or SOB.  She takes 20mg  twice daily of adderal with excellent relief of symptoms.        Current Medication: Outpatient Encounter Medications as of 08/01/2018  Medication Sig  . amphetamine-dextroamphetamine (ADDERALL) 20 MG tablet Take 1 tablet (20 mg total) by mouth 2 (two) times daily.  . fluticasone (FLONASE) 50 MCG/ACT nasal spray Place into both nostrils daily.  Marland Kitchen levonorgestrel (MIRENA) 20 MCG/24HR IUD 1 each by Intrauterine route once.  . loratadine (CLARITIN) 10 MG tablet Take 1 tablet (10 mg total) by mouth daily.  . Multiple Vitamin (MULTIVITAMIN) tablet Take 1 tablet by mouth daily.  . [DISCONTINUED] azithromycin (ZITHROMAX) 250 MG tablet z-pack - take as directed for 5 days (Patient not taking: Reported on 08/01/2018)  . [DISCONTINUED] fluconazole (DIFLUCAN) 150 MG tablet Take one tablet by mouth.  May repeat every 3 days until symptoms resolve. (Patient not taking: Reported on 08/01/2018)   No facility-administered encounter medications on file as of 08/01/2018.     Surgical History: Past Surgical History:  Procedure Laterality Date  . ABDOMINOPLASTY  2017  . COLPOSCOPY  09/21/2015  . WISDOM TOOTH EXTRACTION      Medical History: Past Medical History:  Diagnosis Date  . Allergic rhinitis   . Fracture of clavicle with routine healing right  . History of abnormal cervical Pap smear 2017   LGSIL with HRHPV  . Sinusitis   . Wrist fracture, closed    left    Family History: Family History  Problem Relation Age of Onset  . Diabetes Mother   . Kidney disease Mother   .  Allergies Father   . Breast cancer Maternal Aunt 50  . Diabetes Maternal Grandfather   . Hypertension Maternal Grandfather     Social History   Socioeconomic History  . Marital status: Legally Separated    Spouse name: Not on file  . Number of children: 4  . Years of education: 30  . Highest education level: Not on file  Occupational History  . 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 Financial resource strain: Not on file  . Food insecurity:    Worry: Not on file    Inability: Not on file  . Transportation needs:    Medical: Not on file    Non-medical: Not on file  Tobacco Use  . Smoking status: Former Games developer  . Smokeless tobacco: Never Used  Substance and Sexual Activity  . Alcohol use: Yes    Alcohol/week: 7.0 standard drinks    Types: 7 Glasses of wine per week    Comment: occasionally  . Drug use: Never  . Sexual activity: Yes    Birth control/protection: I.U.D.  Lifestyle  . Physical activity:    Days per week: 4 days    Minutes per session: 30 min  . Stress: Not at all  Relationships  . Social connections:    Talks on phone: Not on file    Gets together: Not on file    Attends religious service: Not on file    Active member of  club or organization: Not on file    Attends meetings of clubs or organizations: Not on file    Relationship status: Not on file  . Intimate partner violence:    Fear of current or ex partner: Not on file    Emotionally abused: Not on file    Physically abused: Not on file    Forced sexual activity: Not on file  Other Topics Concern  . Not on file  Social History Narrative  . Not on file      Review of Systems  Constitutional: Negative for chills, fatigue and unexpected weight change.  HENT: Negative for congestion, rhinorrhea, sneezing and sore throat.   Eyes: Negative for photophobia, pain and redness.  Respiratory: Negative for cough, chest tightness and shortness of breath.   Cardiovascular: Negative for  chest pain and palpitations.  Gastrointestinal: Negative for abdominal pain, constipation, diarrhea, nausea and vomiting.  Endocrine: Negative.   Genitourinary: Negative for dysuria and frequency.  Musculoskeletal: Negative for arthralgias, back pain, joint swelling and neck pain.  Skin: Negative for rash.  Allergic/Immunologic: Negative.   Neurological: Negative for tremors and numbness.  Hematological: Negative for adenopathy. Does not bruise/bleed easily.  Psychiatric/Behavioral: Negative for behavioral problems and sleep disturbance. The patient is not nervous/anxious.     Vital Signs: BP 104/77   Pulse 76   Resp 16   Ht 5\' 4"  (1.626 m)   Wt 171 lb (77.6 kg)   SpO2 99%   BMI 29.35 kg/m    Physical Exam Vitals signs and nursing note reviewed.  Constitutional:      General: She is not in acute distress.    Appearance: She is well-developed. She is not diaphoretic.  HENT:     Head: Normocephalic and atraumatic.     Mouth/Throat:     Pharynx: No oropharyngeal exudate.  Eyes:     Pupils: Pupils are equal, round, and reactive to light.  Neck:     Musculoskeletal: Normal range of motion and neck supple.     Thyroid: No thyromegaly.     Vascular: No JVD.     Trachea: No tracheal deviation.  Cardiovascular:     Rate and Rhythm: Normal rate and regular rhythm.     Heart sounds: Normal heart sounds. No murmur. No friction rub. No gallop.   Pulmonary:     Effort: Pulmonary effort is normal. No respiratory distress.     Breath sounds: Normal breath sounds. No wheezing or rales.  Chest:     Chest wall: No tenderness.  Abdominal:     Palpations: Abdomen is soft.     Tenderness: There is no abdominal tenderness. There is no guarding.  Musculoskeletal: Normal range of motion.  Lymphadenopathy:     Cervical: No cervical adenopathy.  Skin:    General: Skin is warm and dry.  Neurological:     Mental Status: She is alert and oriented to person, place, and time.     Cranial  Nerves: No cranial nerve deficit.  Psychiatric:        Behavior: Behavior normal.        Thought Content: Thought content normal.        Judgment: Judgment normal.     Assessment/Plan: 1. Attention and concentration deficit Refilled Controlled medications today. Reviewed risks and possible side effects associated with taking Stimulants. Combination of these drugs with other psychotropic medications could cause dizziness and drowsiness. Pt needs to Monitor symptoms and exercise caution in driving and operating heavy machinery to  avoid damages to oneself, to others and to the surroundings. Patient verbalized understanding in this matter. Dependence and abuse for these drugs will be monitored closely. A Controlled substance policy and procedure is on file which allows [facility] to order a urine drug screen test at any visit. Patient understands and agrees with the plan.. - amphetamine-dextroamphetamine (ADDERALL) 20 MG tablet; Take 1 tablet (20 mg total) by mouth 2 (two) times daily.  Dispense: 60 tablet; Refill: 0 - amphetamine-dextroamphetamine (ADDERALL) 20 MG tablet; Take 1 tablet (20 mg total) by mouth 2 (two) times daily.  Dispense: 60 tablet; Refill: 0  2. Encounter for long-term (current) use of medications Positive for amphetamines as appropriate.  - POCT Urine Drug Screen  3. Seasonal allergic rhinitis due to pollen Stable, continue current medications as discussed.   General Counseling: kiara mcdowell understanding of the findings of todays visit and agrees with plan of treatment. I have discussed any further diagnostic evaluation that may be needed or ordered today. We also reviewed her medications today. she has been encouraged to call the office with any questions or concerns that should arise related to todays visit.    Orders Placed This Encounter  Procedures  . POCT Urine Drug Screen    No orders of the defined types were placed in this  encounter.   Time spent: 25 Minutes   This patient was seen by Blima Ledger AGNP-C in Collaboration with Dr Lyndon Code as a part of collaborative care agreement     Johnna Acosta AGNP-C Internal medicine

## 2018-09-08 ENCOUNTER — Encounter: Payer: Self-pay | Admitting: Adult Health

## 2018-09-08 ENCOUNTER — Ambulatory Visit: Payer: BLUE CROSS/BLUE SHIELD | Admitting: Adult Health

## 2018-09-08 ENCOUNTER — Other Ambulatory Visit: Payer: Self-pay

## 2018-09-08 VITALS — Resp 16 | Ht 64.0 in | Wt 170.0 lb

## 2018-09-08 DIAGNOSIS — J301 Allergic rhinitis due to pollen: Secondary | ICD-10-CM | POA: Diagnosis not present

## 2018-09-08 DIAGNOSIS — J0110 Acute frontal sinusitis, unspecified: Secondary | ICD-10-CM

## 2018-09-08 DIAGNOSIS — B379 Candidiasis, unspecified: Secondary | ICD-10-CM | POA: Diagnosis not present

## 2018-09-08 MED ORDER — MONTELUKAST SODIUM 10 MG PO TABS: 10.0000 mg | ORAL_TABLET | Freq: Every day | ORAL | 3 refills | Status: DC

## 2018-09-08 MED ORDER — FLUCONAZOLE 150 MG PO TABS: 150.0000 mg | ORAL_TABLET | ORAL | 1 refills | Status: DC | PRN

## 2018-09-08 MED ORDER — FLUTICASONE PROPIONATE 50 MCG/ACT NA SUSP: 2.0000 | Freq: Every day | NASAL | 2 refills | Status: DC

## 2018-09-08 MED ORDER — AMOXICILLIN-POT CLAVULANATE 875-125 MG PO TABS: 1.0000 | ORAL_TABLET | Freq: Two times a day (BID) | ORAL | 0 refills | Status: DC

## 2018-09-08 NOTE — Progress Notes (Signed)
[facility] [address] [place], [place] [address]  Internal MEDICINE  Telephone Visit  Patient Name: Jamie Meadows  [id number]  [id number]  Date of Service: 09/08/2018  I connected with the patient at 1140 by telephone and verified the patients identity using two identifiers.  I discussed the limitations, risks, security and privacy concerns of performing an evaluation and management service by telephone and the availability of in person appointments. I also discussed with the patient that there may be a patient responsible charge related to the service.  The patient expressed understanding and agrees to proceed.    Chief Complaint  Patient presents with  . Telephone Screen  . Sinusitis    PRESSURE BEHIND RIGHT EYE  . Telephone Assessment    HPI  PT reports about 2 weeks of headache, eye pressure and sinus pressure.  Denies fever, cough, or sore throat.  She does have some PND at times.    Current Medication: Outpatient Encounter Medications as of 09/08/2018  Medication Sig  . amphetamine-dextroamphetamine (ADDERALL) 20 MG tablet Take 1 tablet (20 mg total) by mouth 2 (two) times daily.  . fluticasone (FLONASE) 50 MCG/ACT nasal spray Place 2 sprays into both nostrils daily.  Marland Kitchen. levonorgestrel (MIRENA) 20 MCG/24HR IUD 1 each by Intrauterine route once.  . loratadine (CLARITIN) 10 MG tablet Take 1 tablet (10 mg total) by mouth daily.  . Multiple Vitamin (MULTIVITAMIN) tablet Take 1 tablet by mouth daily.  . [DISCONTINUED] fluticasone (FLONASE) 50 MCG/ACT nasal spray Place into both nostrils daily.  Marland Kitchen. amoxicillin-clavulanate (AUGMENTIN) 875-125 MG tablet Take 1 tablet by mouth 2 (two) times daily.  . fluconazole (DIFLUCAN) 150 MG tablet Take 1 tablet (150 mg total) by mouth every three (3) days as needed.  . montelukast (SINGULAIR) 10 MG tablet Take 1 tablet (10 mg total) by mouth at bedtime.  . [DISCONTINUED] amphetamine-dextroamphetamine (ADDERALL) 20 MG tablet  Take 1 tablet (20 mg total) by mouth 2 (two) times daily.   No facility-administered encounter medications on file as of 09/08/2018.     Surgical History: Past Surgical History:  Procedure Laterality Date  . ABDOMINOPLASTY  2017  . COLPOSCOPY  09/21/2015  . WISDOM TOOTH EXTRACTION      Medical History: Past Medical History:  Diagnosis Date  . Allergic rhinitis   . Fracture of clavicle with routine healing right  . History of abnormal cervical Pap smear 2017   LGSIL with HRHPV  . Sinusitis   . Wrist fracture, closed    left    Family History: Family History  Problem Relation Age of Onset  . Diabetes Mother   . Kidney disease Mother   . Allergies Father   . Breast cancer Maternal Aunt 50  . Diabetes Maternal Grandfather   . Hypertension Maternal Grandfather     Social History   Socioeconomic History  . Marital status: Legally Separated    Spouse name: Not on file  . Number of children: 4  . Years of education: 5616  . Highest education level: Not on file  Occupational History  . 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 Financial resource strain: Not on file  . Food insecurity:    Worry: Not on file    Inability: Not on file  . Transportation needs:    Medical: Not on file    Non-medical: Not on file  Tobacco Use  . Smoking status: Former Games developermoker  . Smokeless tobacco: Never Used  Substance and Sexual Activity  .  Alcohol use: Yes    Alcohol/week: 7.0 standard drinks    Types: 7 Glasses of wine per week    Comment: occasionally  . Drug use: Never  . Sexual activity: Yes    Birth control/protection: I.U.D.  Lifestyle  . Physical activity:    Days per week: 4 days    Minutes per session: 30 min  . Stress: Not at all  Relationships  . Social connections:    Talks on phone: Not on file    Gets together: Not on file    Attends religious service: Not on file    Active member of club or organization: Not on file    Attends meetings of clubs or  organizations: Not on file    Relationship status: Not on file  . Intimate partner violence:    Fear of current or ex partner: Not on file    Emotionally abused: Not on file    Physically abused: Not on file    Forced sexual activity: Not on file  Other Topics Concern  . Not on file  Social History Narrative  . Not on file      Review of Systems  Constitutional: Negative for chills, fatigue and unexpected weight change.  HENT: Positive for postnasal drip, sinus pressure and sinus pain. Negative for congestion, rhinorrhea, sneezing and sore throat.   Eyes: Negative for photophobia, pain and redness.  Respiratory: Negative for cough, chest tightness and shortness of breath.   Cardiovascular: Negative for chest pain and palpitations.  Gastrointestinal: Negative for abdominal pain, constipation, diarrhea, nausea and vomiting.  Endocrine: Negative.   Genitourinary: Negative for dysuria and frequency.  Musculoskeletal: Negative for arthralgias, back pain, joint swelling and neck pain.  Skin: Negative for rash.  Allergic/Immunologic: Negative.   Neurological: Negative for tremors and numbness.  Hematological: Negative for adenopathy. Does not bruise/bleed easily.  Psychiatric/Behavioral: Negative for behavioral problems and sleep disturbance. The patient is not nervous/anxious.     Vital Signs: Resp 16   Ht 5\' 4"  (1.626 m)   Wt 170 lb (77.1 kg)   BMI 29.18 kg/m    Observation/Objective:  Well appearing non-toxic.  Speaking in full sentences.  NAD noted.    Assessment/Plan: 1. Acute non-recurrent frontal sinusitis Advised patient to take entire course of antibiotics as prescribed with food. Pt should return to clinic in 7-10 days if symptoms fail to improve or new symptoms develop.  - amoxicillin-clavulanate (AUGMENTIN) 875-125 MG tablet; Take 1 tablet by mouth 2 (two) times daily.  Dispense: 20 tablet; Refill: 0  2. Seasonal allergic rhinitis due to pollen Take singulair  and use flonse as directed.  - montelukast (SINGULAIR) 10 MG tablet; Take 1 tablet (10 mg total) by mouth at bedtime.  Dispense: 30 tablet; Refill: 3 - fluticasone (FLONASE) 50 MCG/ACT nasal spray; Place 2 sprays into both nostrils daily.  Dispense: 16 g; Refill: 2  3. Candidiasis Use Diflucan as needed for yeast infection due to antibiotic therapy. - fluconazole (DIFLUCAN) 150 MG tablet; Take 1 tablet (150 mg total) by mouth every three (3) days as needed.  Dispense: 3 tablet; Refill: 1  General Counseling: Geena verbalizes understanding of the findings of today's phone visit and agrees with plan of treatment. I have discussed any further diagnostic evaluation that may be needed or ordered today. We also reviewed her medications today. she has been encouraged to call the office with any questions or concerns that should arise related to todays visit.    No orders of  the defined types were placed in this encounter.   Meds ordered this encounter  Medications  . amoxicillin-clavulanate (AUGMENTIN) 875-125 MG tablet    Sig: Take 1 tablet by mouth 2 (two) times daily.    Dispense:  20 tablet    Refill:  0  . montelukast (SINGULAIR) 10 MG tablet    Sig: Take 1 tablet (10 mg total) by mouth at bedtime.    Dispense:  30 tablet    Refill:  3  . fluticasone (FLONASE) 50 MCG/ACT nasal spray    Sig: Place 2 sprays into both nostrils daily.    Dispense:  16 g    Refill:  2  . fluconazole (DIFLUCAN) 150 MG tablet    Sig: Take 1 tablet (150 mg total) by mouth every three (3) days as needed.    Dispense:  3 tablet    Refill:  1    Time spent: 11 Minutes    Blima Ledger AGNP-C Internal medicine

## 2018-09-08 NOTE — Patient Instructions (Signed)

## 2018-10-01 ENCOUNTER — Other Ambulatory Visit: Payer: Self-pay

## 2018-10-01 ENCOUNTER — Ambulatory Visit: Payer: BLUE CROSS/BLUE SHIELD | Admitting: Adult Health

## 2018-10-01 ENCOUNTER — Encounter: Payer: Self-pay | Admitting: Adult Health

## 2018-10-01 VITALS — Resp 16 | Ht 64.0 in | Wt 170.0 lb

## 2018-10-01 DIAGNOSIS — J329 Chronic sinusitis, unspecified: Secondary | ICD-10-CM | POA: Diagnosis not present

## 2018-10-01 DIAGNOSIS — Z1239 Encounter for other screening for malignant neoplasm of breast: Secondary | ICD-10-CM

## 2018-10-01 DIAGNOSIS — R41840 Attention and concentration deficit: Secondary | ICD-10-CM | POA: Diagnosis not present

## 2018-10-01 DIAGNOSIS — J301 Allergic rhinitis due to pollen: Secondary | ICD-10-CM

## 2018-10-01 MED ORDER — AMPHETAMINE-DEXTROAMPHETAMINE 20 MG PO TABS: 20.0000 mg | ORAL_TABLET | Freq: Two times a day (BID) | ORAL | 0 refills | Status: DC

## 2018-10-01 NOTE — Progress Notes (Signed)
[facility] [address] [place], [place] [address]  Internal MEDICINE  Telephone Visit  Patient Name: Jamie Meadows  [id number]  [id number]  Date of Service: 10/01/2018  I connected with the patient at 1032 by telephone and verified the patients identity using two identifiers.  I discussed the limitations, risks, security and privacy concerns of performing an evaluation and management service by telephone and the availability of in person appointments. I also discussed with the patient that there may be a patient responsible charge related to the service.  The patient expressed understanding and agrees to proceed.    Chief Complaint  Patient presents with  . Telephone Assessment  . Telephone Screen  . Medication Refill    Adderall    HPI PT is seen for follow up on ADD.  She is requesting refills on her Adderall at this time.  She reports good control of her symptoms using using 20mg  bid.  Many days she reports taking only half the dose.  She denies any side effects from the medications.  No palpitations, headaches, or chest pain.     Current Medication: Outpatient Encounter Medications as of 10/01/2018  Medication Sig  . [START ON 10/31/2018] amphetamine-dextroamphetamine (ADDERALL) 20 MG tablet Take 1 tablet (20 mg total) by mouth 2 (two) times daily.  . fluconazole (DIFLUCAN) 150 MG tablet Take 1 tablet (150 mg total) by mouth every three (3) days as needed.  . fluticasone (FLONASE) 50 MCG/ACT nasal spray Place 2 sprays into both nostrils daily.  Marland Kitchen. levonorgestrel (MIRENA) 20 MCG/24HR IUD 1 each by Intrauterine route once.  . loratadine (CLARITIN) 10 MG tablet Take 1 tablet (10 mg total) by mouth daily.  . Multiple Vitamin (MULTIVITAMIN) tablet Take 1 tablet by mouth daily.  . [DISCONTINUED] amphetamine-dextroamphetamine (ADDERALL) 20 MG tablet Take 1 tablet (20 mg total) by mouth 2 (two) times daily.  . [DISCONTINUED] amphetamine-dextroamphetamine (ADDERALL) 20  MG tablet Take 1 tablet (20 mg total) by mouth 2 (two) times daily.  Marland Kitchen. amoxicillin-clavulanate (AUGMENTIN) 875-125 MG tablet Take 1 tablet by mouth 2 (two) times daily. (Patient not taking: Reported on 10/01/2018)  . montelukast (SINGULAIR) 10 MG tablet Take 1 tablet (10 mg total) by mouth at bedtime. (Patient not taking: Reported on 10/01/2018)   No facility-administered encounter medications on file as of 10/01/2018.     Surgical History: Past Surgical History:  Procedure Laterality Date  . ABDOMINOPLASTY  2017  . COLPOSCOPY  09/21/2015  . WISDOM TOOTH EXTRACTION      Medical History: Past Medical History:  Diagnosis Date  . Allergic rhinitis   . Fracture of clavicle with routine healing right  . History of abnormal cervical Pap smear 2017   LGSIL with HRHPV  . Sinusitis   . Wrist fracture, closed    left    Family History: Family History  Problem Relation Age of Onset  . Diabetes Mother   . Kidney disease Mother   . Allergies Father   . Breast cancer Maternal Aunt 50  . Diabetes Maternal Grandfather   . Hypertension Maternal Grandfather     Social History   Socioeconomic History  . Marital status: Legally Separated    Spouse name: Not on file  . Number of children: 4  . Years of education: 2816  . Highest education level: Not on file  Occupational History  . 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 Financial resource strain: Not on file  . Food insecurity:    Worry:  Not on file    Inability: Not on file  . Transportation needs:    Medical: Not on file    Non-medical: Not on file  Tobacco Use  . Smoking status: Former Games developer  . Smokeless tobacco: Never Used  Substance and Sexual Activity  . Alcohol use: Yes    Alcohol/week: 7.0 standard drinks    Types: 7 Glasses of wine per week    Comment: occasionally  . Drug use: Never  . Sexual activity: Yes    Birth control/protection: I.U.D.  Lifestyle  . Physical activity:    Days per week: 4 days     Minutes per session: 30 min  . Stress: Not at all  Relationships  . Social connections:    Talks on phone: Not on file    Gets together: Not on file    Attends religious service: Not on file    Active member of club or organization: Not on file    Attends meetings of clubs or organizations: Not on file    Relationship status: Not on file  . Intimate partner violence:    Fear of current or ex partner: Not on file    Emotionally abused: Not on file    Physically abused: Not on file    Forced sexual activity: Not on file  Other Topics Concern  . Not on file  Social History Narrative  . Not on file      Review of Systems  Constitutional: Negative for chills, fatigue and unexpected weight change.  HENT: Negative for congestion, rhinorrhea, sneezing and sore throat.   Eyes: Negative for photophobia, pain and redness.  Respiratory: Negative for cough, chest tightness and shortness of breath.   Cardiovascular: Negative for chest pain and palpitations.  Gastrointestinal: Negative for abdominal pain, constipation, diarrhea, nausea and vomiting.  Endocrine: Negative.   Genitourinary: Negative for dysuria and frequency.  Musculoskeletal: Negative for arthralgias, back pain, joint swelling and neck pain.  Skin: Negative for rash.  Allergic/Immunologic: Negative.   Neurological: Negative for tremors and numbness.  Hematological: Negative for adenopathy. Does not bruise/bleed easily.  Psychiatric/Behavioral: Negative for behavioral problems and sleep disturbance. The patient is not nervous/anxious.     Vital Signs: Resp 16   Ht 5\' 4"  (1.626 m)   Wt 170 lb (77.1 kg)   BMI 29.18 kg/m    Observation/Objective:  PT is well appearing.  NAD noted.    Assessment/Plan: 1. Attention and concentration deficit Two months of Adderall filled at this time.  Will follow up in 2 months for urine drug screen and refills.  Refilled Controlled medications today. Reviewed risks and possible  side effects associated with taking Stimulants. Combination of these drugs with other psychotropic medications could cause dizziness and drowsiness. Pt needs to Monitor symptoms and exercise caution in driving and operating heavy machinery to avoid damages to oneself, to others and to the surroundings. Patient verbalized understanding in this matter. Dependence and abuse for these drugs will be monitored closely. A Controlled substance policy and procedure is on file which allows [facility] to order a urine drug screen test at any visit. Patient understands and agrees with the plan.. - amphetamine-dextroamphetamine (ADDERALL) 20 MG tablet; Take 1 tablet (20 mg total) by mouth 2 (two) times daily.  Dispense: 60 tablet; Refill: 0  2. Seasonal allergic rhinitis due to pollen PT reports she is having better control of symptoms at this time.  Continue present therapy.   3. Recurrent sinusitis We discussed Sinus  CT and referral to ENT at future visit due to ongoing sinus issues.  She is agreeable to this.   4. Screening for breast cancer Order for Mammogram at this time, so that patient can begin to schedule. - MM DIGITAL SCREENING BILATERAL; Future  General Counseling: dawn convery understanding of the findings of today's phone visit and agrees with plan of treatment. I have discussed any further diagnostic evaluation that may be needed or ordered today. We also reviewed her medications today. she has been encouraged to call the office with any questions or concerns that should arise related to todays visit.    Orders Placed This Encounter  Procedures  . MM DIGITAL SCREENING BILATERAL    Meds ordered this encounter  Medications  . DISCONTD: amphetamine-dextroamphetamine (ADDERALL) 20 MG tablet    Sig: Take 1 tablet (20 mg total) by mouth 2 (two) times daily.    Dispense:  60 tablet    Refill:  0  . amphetamine-dextroamphetamine (ADDERALL) 20 MG tablet    Sig: Take 1 tablet  (20 mg total) by mouth 2 (two) times daily.    Dispense:  60 tablet    Refill:  0    Do not fill before 10/31/2018    Time spent: 14 Minutes    Allee Busk AGNP-C Internal medicine

## 2018-12-01 ENCOUNTER — Ambulatory Visit: Payer: BLUE CROSS/BLUE SHIELD | Admitting: Adult Health

## 2018-12-02 ENCOUNTER — Other Ambulatory Visit: Payer: Self-pay

## 2018-12-02 ENCOUNTER — Ambulatory Visit: Payer: Self-pay | Admitting: Adult Health

## 2018-12-02 ENCOUNTER — Encounter: Payer: Self-pay | Admitting: Adult Health

## 2018-12-02 VITALS — BP 118/88 | HR 95 | Resp 16 | Ht 64.0 in | Wt 173.0 lb

## 2018-12-02 DIAGNOSIS — Z79899 Other long term (current) drug therapy: Secondary | ICD-10-CM

## 2018-12-02 DIAGNOSIS — R41840 Attention and concentration deficit: Secondary | ICD-10-CM

## 2018-12-02 LAB — POCT URINE DRUG SCREEN
POC Amphetamine UR: POSITIVE — AB
POC BENZODIAZEPINES UR: NOT DETECTED
POC Barbiturate UR: NOT DETECTED
POC Cocaine UR: NOT DETECTED
POC Marijuana UR: NOT DETECTED
POC Methadone UR: NOT DETECTED
POC Methamphetamine UR: NOT DETECTED
POC Opiate Ur: NOT DETECTED
POC Oxycodone UR: NOT DETECTED
POC PHENCYCLIDINE UR: NOT DETECTED
POC TRICYCLICS UR: NOT DETECTED

## 2018-12-02 MED ORDER — AMPHETAMINE-DEXTROAMPHETAMINE 20 MG PO TABS: 20.0000 mg | ORAL_TABLET | Freq: Two times a day (BID) | ORAL | 0 refills | Status: DC

## 2018-12-02 NOTE — Progress Notes (Signed)
[facility] [address] [place], [place] [address]  Internal MEDICINE  Office Visit Note  Patient Name: Jamie Meadows  [id number]  [id number]  Date of Service: 12/02/2018  Chief Complaint  Patient presents with  . Medical Management of Chronic Issues    2 month follow up medcation refill     HPI  Pt is here for follow up on ADD.  She is currently taking 20mg  of adderall BID.  Some days she does not need both doses, but the majority of the time she does.  She continues to report excellent relief of symptoms and denies side effects of medication.    Current Medication: Outpatient Encounter Medications as of 12/02/2018  Medication Sig  . amphetamine-dextroamphetamine (ADDERALL) 20 MG tablet Take 1 tablet (20 mg total) by mouth 2 (two) times daily.  . fluticasone (FLONASE) 50 MCG/ACT nasal spray Place 2 sprays into both nostrils daily.  Marland Kitchen. levonorgestrel (MIRENA) 20 MCG/24HR IUD 1 each by Intrauterine route once.  . loratadine (CLARITIN) 10 MG tablet Take 1 tablet (10 mg total) by mouth daily.  . Multiple Vitamin (MULTIVITAMIN) tablet Take 1 tablet by mouth daily.  . [DISCONTINUED] amoxicillin-clavulanate (AUGMENTIN) 875-125 MG tablet Take 1 tablet by mouth 2 (two) times daily. (Patient not taking: Reported on 10/01/2018)  . [DISCONTINUED] fluconazole (DIFLUCAN) 150 MG tablet Take 1 tablet (150 mg total) by mouth every three (3) days as needed. (Patient not taking: Reported on 12/02/2018)  . [DISCONTINUED] montelukast (SINGULAIR) 10 MG tablet Take 1 tablet (10 mg total) by mouth at bedtime. (Patient not taking: Reported on 10/01/2018)   No facility-administered encounter medications on file as of 12/02/2018.     Surgical History: Past Surgical History:  Procedure Laterality Date  . ABDOMINOPLASTY  2017  . COLPOSCOPY  09/21/2015  . WISDOM TOOTH EXTRACTION      Medical History: Past Medical History:  Diagnosis Date  . Allergic rhinitis   . Fracture of clavicle  with routine healing right  . History of abnormal cervical Pap smear 2017   LGSIL with HRHPV  . Sinusitis   . Wrist fracture, closed    left    Family History: Family History  Problem Relation Age of Onset  . Diabetes Mother   . Kidney disease Mother   . Allergies Father   . Breast cancer Maternal Aunt 50  . Diabetes Maternal Grandfather   . Hypertension Maternal Grandfather     Social History   Socioeconomic History  . Marital status: Legally Separated    Spouse name: Not on file  . Number of children: 4  . Years of education: 816  . Highest education level: Not on file  Occupational History  . 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 Financial resource strain: Not on file  . Food insecurity    Worry: Not on file    Inability: Not on file  . Transportation needs    Medical: Not on file    Non-medical: Not on file  Tobacco Use  . Smoking status: Former Games developermoker  . Smokeless tobacco: Never Used  Substance and Sexual Activity  . Alcohol use: Yes    Alcohol/week: 7.0 standard drinks    Types: 7 Glasses of wine per week    Comment: occasionally  . Drug use: Never  . Sexual activity: Yes    Birth control/protection: I.U.D.  Lifestyle  . Physical activity    Days per week: 4 days    Minutes per session: 30 min  .  Stress: Not at all  Relationships  . Social Herbalist on phone: Not on file    Gets together: Not on file    Attends religious service: Not on file    Active member of club or organization: Not on file    Attends meetings of clubs or organizations: Not on file    Relationship status: Not on file  . Intimate partner violence    Fear of current or ex partner: Not on file    Emotionally abused: Not on file    Physically abused: Not on file    Forced sexual activity: Not on file  Other Topics Concern  . Not on file  Social History Narrative  . Not on file      Review of Systems  Constitutional: Negative for chills, fatigue  and unexpected weight change.  HENT: Negative for congestion, rhinorrhea, sneezing and sore throat.   Eyes: Negative for photophobia, pain and redness.  Respiratory: Negative for cough, chest tightness and shortness of breath.   Cardiovascular: Negative for chest pain and palpitations.  Gastrointestinal: Negative for abdominal pain, constipation, diarrhea, nausea and vomiting.  Endocrine: Negative.   Genitourinary: Negative for dysuria and frequency.  Musculoskeletal: Negative for arthralgias, back pain, joint swelling and neck pain.  Skin: Negative for rash.  Allergic/Immunologic: Negative.   Neurological: Negative for tremors and numbness.  Hematological: Negative for adenopathy. Does not bruise/bleed easily.  Psychiatric/Behavioral: Negative for behavioral problems and sleep disturbance. The patient is not nervous/anxious.     Vital Signs: BP 118/88   Pulse 95   Resp 16   Ht 5\' 4"  (1.626 m)   Wt 173 lb (78.5 kg)   SpO2 98%   BMI 29.70 kg/m    Physical Exam Vitals signs and nursing note reviewed.  Constitutional:      General: She is not in acute distress.    Appearance: She is well-developed. She is not diaphoretic.  HENT:     Head: Normocephalic and atraumatic.     Mouth/Throat:     Pharynx: No oropharyngeal exudate.  Eyes:     Pupils: Pupils are equal, round, and reactive to light.  Neck:     Musculoskeletal: Normal range of motion and neck supple.     Thyroid: No thyromegaly.     Vascular: No JVD.     Trachea: No tracheal deviation.  Cardiovascular:     Rate and Rhythm: Normal rate and regular rhythm.     Heart sounds: Normal heart sounds. No murmur. No friction rub. No gallop.   Pulmonary:     Effort: Pulmonary effort is normal. No respiratory distress.     Breath sounds: Normal breath sounds. No wheezing or rales.  Chest:     Chest wall: No tenderness.  Abdominal:     Palpations: Abdomen is soft.     Tenderness: There is no abdominal tenderness. There is  no guarding.  Musculoskeletal: Normal range of motion.  Lymphadenopathy:     Cervical: No cervical adenopathy.  Skin:    General: Skin is warm and dry.  Neurological:     Mental Status: She is alert and oriented to person, place, and time.     Cranial Nerves: No cranial nerve deficit.  Psychiatric:        Behavior: Behavior normal.        Thought Content: Thought content normal.        Judgment: Judgment normal.    Assessment/Plan: 1. Attention and concentration deficit Refilled  Controlled medications today. Reviewed risks and possible side effects associated with taking Stimulants. Combination of these drugs with other psychotropic medications could cause dizziness and drowsiness. Pt needs to Monitor symptoms and exercise caution in driving and operating heavy machinery to avoid damages to oneself, to others and to the surroundings. Patient verbalized understanding in this matter. Dependence and abuse for these drugs will be monitored closely. A Controlled substance policy and procedure is on file which allows [facility] order a urine drug screen test at any visit. Patient understands and agrees with the plan.. - amphetamine-dextroamphetamine (ADDERALL) 20 MG tablet; Take 1 tablet (20 mg total) by mouth 2 (two) times daily.  Dispense: 60 tablet; Refill: 0 - amphetamine-dextroamphetamine (ADDERALL) 20 MG tablet; Take 1 tablet (20 mg total) by mouth 2 (two) times daily.  Dispense: 60 tablet; Refill: 0  2. High risk medication use - POCT Urine Drug Screen  General Counseling: Victorino DikeJennifer verbalizes understanding of the findings of todays visit and agrees with plan of treatment. I have discussed any further diagnostic evaluation that may be needed or ordered today. We also reviewed her medications today. she has been encouraged to call the office with any questions or concerns that should arise related to todays visit.    Orders Placed This Encounter  Procedures  . POCT Urine  Drug Screen    No orders of the defined types were placed in this encounter.   Time spent: 20 Minutes   This patient was seen by Blima LedgerAdam Joanie Duprey AGNP-C in Collaboration with Dr Lyndon CodeFozia M Khan as a part of collaborative care agreement     Johnna AcostaAdam J. Jerri Hargadon AGNP-C Internal medicine

## 2019-01-22 ENCOUNTER — Encounter: Payer: Self-pay | Admitting: Adult Health

## 2019-01-22 ENCOUNTER — Other Ambulatory Visit: Payer: Self-pay

## 2019-01-22 ENCOUNTER — Ambulatory Visit: Payer: BLUE CROSS/BLUE SHIELD | Admitting: Adult Health

## 2019-01-22 VITALS — BP 114/76 | HR 90 | Resp 16 | Ht 64.0 in | Wt 183.0 lb

## 2019-01-22 DIAGNOSIS — R41840 Attention and concentration deficit: Secondary | ICD-10-CM | POA: Diagnosis not present

## 2019-01-22 DIAGNOSIS — R5383 Other fatigue: Secondary | ICD-10-CM | POA: Diagnosis not present

## 2019-01-22 DIAGNOSIS — R0981 Nasal congestion: Secondary | ICD-10-CM | POA: Diagnosis not present

## 2019-01-22 MED ORDER — AMPHETAMINE-DEXTROAMPHETAMINE 20 MG PO TABS: 20.0000 mg | ORAL_TABLET | Freq: Two times a day (BID) | ORAL | 0 refills | Status: DC

## 2019-01-22 MED ORDER — AMPHETAMINE-DEXTROAMPHETAMINE 20 MG PO TABS: 20.0000 mg | ORAL_TABLET | Freq: Every day | ORAL | 0 refills | Status: DC

## 2019-01-22 NOTE — Progress Notes (Signed)
[facility] [address] [place], [place] [address]  Internal MEDICINE  Office Visit Note  Patient Name: Jamie Meadows  [id number]  [id number]  Date of Service: 01/22/2019  Chief Complaint  Patient presents with  . Medical Management of Chronic Issues    8 week follow up weight loss management     HPI  Pt is here for follow up on weight loss.  She has gained 10 pounds in the last 6 weeks. She is unsure as to why she sees a significant weight gain.  She denies any symptoms or new issues.  She does also have some ongoing and lingering sinus issues, and she would like to see ENT.     Current Medication: Outpatient Encounter Medications as of 01/22/2019  Medication Sig  . amphetamine-dextroamphetamine (ADDERALL) 20 MG tablet Take 1 tablet (20 mg total) by mouth 2 (two) times daily.  Marland Kitchen. amphetamine-dextroamphetamine (ADDERALL) 20 MG tablet Take 1 tablet (20 mg total) by mouth 2 (two) times daily.  . fluticasone (FLONASE) 50 MCG/ACT nasal spray Place 2 sprays into both nostrils daily.  Marland Kitchen. levonorgestrel (MIRENA) 20 MCG/24HR IUD 1 each by Intrauterine route once.  . loratadine (CLARITIN) 10 MG tablet Take 1 tablet (10 mg total) by mouth daily.  . Multiple Vitamin (MULTIVITAMIN) tablet Take 1 tablet by mouth daily.   No facility-administered encounter medications on file as of 01/22/2019.     Surgical History: Past Surgical History:  Procedure Laterality Date  . ABDOMINOPLASTY  2017  . COLPOSCOPY  09/21/2015  . WISDOM TOOTH EXTRACTION      Medical History: Past Medical History:  Diagnosis Date  . Allergic rhinitis   . Fracture of clavicle with routine healing right  . History of abnormal cervical Pap smear 2017   LGSIL with HRHPV  . Sinusitis   . Wrist fracture, closed    left    Family History: Family History  Problem Relation Age of Onset  . Diabetes Mother   . Kidney disease Mother   . Allergies Father   . Breast cancer Maternal Aunt 50  . Diabetes  Maternal Grandfather   . Hypertension Maternal Grandfather     Social History   Socioeconomic History  . Marital status: Legally Separated    Spouse name: Not on file  . Number of children: 4  . Years of education: 3216  . Highest education level: Not on file  Occupational History  . 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 Financial resource strain: Not on file  . Food insecurity    Worry: Not on file    Inability: Not on file  . Transportation needs    Medical: Not on file    Non-medical: Not on file  Tobacco Use  . Smoking status: Former Games developermoker  . Smokeless tobacco: Never Used  Substance and Sexual Activity  . Alcohol use: Yes    Alcohol/week: 7.0 standard drinks    Types: 7 Glasses of wine per week    Comment: occasionally  . Drug use: Never  . Sexual activity: Yes    Birth control/protection: I.U.D.  Lifestyle  . Physical activity    Days per week: 4 days    Minutes per session: 30 min  . Stress: Not at all  Relationships  . Social Musicianconnections    Talks on phone: Not on file    Gets together: Not on file    Attends religious service: Not on file    Active member of club  or organization: Not on file    Attends meetings of clubs or organizations: Not on file    Relationship status: Not on file  . Intimate partner violence    Fear of current or ex partner: Not on file    Emotionally abused: Not on file    Physically abused: Not on file    Forced sexual activity: Not on file  Other Topics Concern  . Not on file  Social History Narrative  . Not on file      Review of Systems  Constitutional: Negative for chills, fatigue and unexpected weight change.  HENT: Negative for congestion, rhinorrhea, sneezing and sore throat.   Eyes: Negative for photophobia, pain and redness.  Respiratory: Negative for cough, chest tightness and shortness of breath.   Cardiovascular: Negative for chest pain and palpitations.  Gastrointestinal: Negative for abdominal  pain, constipation, diarrhea, nausea and vomiting.  Endocrine: Negative.   Genitourinary: Negative for dysuria and frequency.  Musculoskeletal: Negative for arthralgias, back pain, joint swelling and neck pain.  Skin: Negative for rash.  Allergic/Immunologic: Negative.   Neurological: Negative for tremors and numbness.  Hematological: Negative for adenopathy. Does not bruise/bleed easily.  Psychiatric/Behavioral: Negative for behavioral problems and sleep disturbance. The patient is not nervous/anxious.     Vital Signs: BP 114/76   Pulse 90   Resp 16   Ht 5\' 4"  (1.626 m)   Wt 183 lb (83 kg)   SpO2 99%   BMI 31.41 kg/m    Physical Exam Vitals signs and nursing note reviewed.  Constitutional:      General: She is not in acute distress.    Appearance: She is well-developed. She is not diaphoretic.  HENT:     Head: Normocephalic and atraumatic.     Mouth/Throat:     Pharynx: No oropharyngeal exudate.  Eyes:     Pupils: Pupils are equal, round, and reactive to light.  Neck:     Musculoskeletal: Normal range of motion and neck supple.     Thyroid: No thyromegaly.     Vascular: No JVD.     Trachea: No tracheal deviation.  Cardiovascular:     Rate and Rhythm: Normal rate and regular rhythm.     Heart sounds: Normal heart sounds. No murmur. No friction rub. No gallop.   Pulmonary:     Effort: Pulmonary effort is normal. No respiratory distress.     Breath sounds: Normal breath sounds. No wheezing or rales.  Chest:     Chest wall: No tenderness.  Abdominal:     Palpations: Abdomen is soft.     Tenderness: There is no abdominal tenderness. There is no guarding.  Musculoskeletal: Normal range of motion.  Lymphadenopathy:     Cervical: No cervical adenopathy.  Skin:    General: Skin is warm and dry.  Neurological:     Mental Status: She is alert and oriented to person, place, and time.     Cranial Nerves: No cranial nerve deficit.  Psychiatric:        Behavior: Behavior  normal.        Thought Content: Thought content normal.        Judgment: Judgment normal.     Assessment/Plan: 1. Attention and concentration deficit Refilled Controlled medications today. Reviewed risks and possible side effects associated with taking Stimulants. Combination of these drugs with other psychotropic medications could cause dizziness and drowsiness. Pt needs to Monitor symptoms and exercise caution in driving and operating heavy machinery to avoid  damages to oneself, to others and to the surroundings. Patient verbalized understanding in this matter. Dependence and abuse for these drugs will be monitored closely. A Controlled substance policy and procedure is on file which allows [facility] to order a urine drug screen test at any visit. Patient understands and agrees with the plan.. - amphetamine-dextroamphetamine (ADDERALL) 20 MG tablet; Take 1 tablet (20 mg total) by mouth 2 (two) times daily.  Dispense: 60 tablet; Refill: 0 - amphetamine-dextroamphetamine (ADDERALL) 20 MG tablet; Take 1 tablet (20 mg total) by mouth daily.  Dispense: 60 tablet; Refill: 0  2. Sinus congestion Pt is requesting referral to ENT at this time for ongoing sinus issues.  - Ambulatory referral to ENT  3. Other fatigue Continue to monitor.  General Counseling: ashyla luth understanding of the findings of todays visit and agrees with plan of treatment. I have discussed any further diagnostic evaluation that may be needed or ordered today. We also reviewed her medications today. she has been encouraged to call the office with any questions or concerns that should arise related to todays visit.    No orders of the defined types were placed in this encounter.   No orders of the defined types were placed in this encounter.   Time spent: 20 Minutes   This patient was seen by Orson Gear AGNP-C in Collaboration with Dr Lavera Guise as a part of collaborative care agreement      Kendell Bane AGNP-C Internal medicine

## 2019-01-28 ENCOUNTER — Other Ambulatory Visit: Payer: Self-pay | Admitting: Adult Health

## 2019-01-29 LAB — TSH: TSH: 1.58 u[IU]/mL (ref 0.450–4.500)

## 2019-01-29 LAB — CBC WITH DIFFERENTIAL/PLATELET
Basophils Absolute: 0.1 10*3/uL (ref 0.0–0.2)
Basos: 1 %
EOS (ABSOLUTE): 0.1 10*3/uL (ref 0.0–0.4)
Eos: 2 %
Hematocrit: 40.1 % (ref 34.0–46.6)
Hemoglobin: 13.7 g/dL (ref 11.1–15.9)
Immature Grans (Abs): 0 10*3/uL (ref 0.0–0.1)
Immature Granulocytes: 0 %
Lymphocytes Absolute: 2 10*3/uL (ref 0.7–3.1)
Lymphs: 32 %
MCH: 31.9 pg (ref 26.6–33.0)
MCHC: 34.2 g/dL (ref 31.5–35.7)
MCV: 94 fL (ref 79–97)
Monocytes Absolute: 0.6 10*3/uL (ref 0.1–0.9)
Monocytes: 9 %
Neutrophils Absolute: 3.5 10*3/uL (ref 1.4–7.0)
Neutrophils: 56 %
Platelets: 295 10*3/uL (ref 150–450)
RBC: 4.29 x10E6/uL (ref 3.77–5.28)
RDW: 11.6 % — ABNORMAL LOW (ref 11.7–15.4)
WBC: 6.3 10*3/uL (ref 3.4–10.8)

## 2019-01-29 LAB — LIPID PANEL WITH LDL/HDL RATIO
Cholesterol, Total: 155 mg/dL (ref 100–199)
HDL: 68 mg/dL (ref >39)
LDL Chol Calc (NIH): 77 mg/dL (ref 0–99)
LDL/HDL Ratio: 1.1 ratio (ref 0.0–3.2)
Triglycerides: 47 mg/dL (ref 0–149)
VLDL Cholesterol Cal: 10 mg/dL (ref 5–40)

## 2019-01-29 LAB — VITAMIN D 25 HYDROXY (VIT D DEFICIENCY, FRACTURES): Vit D, 25-Hydroxy: 38.4 ng/mL (ref 30.0–100.0)

## 2019-01-29 LAB — B12 AND FOLATE PANEL
Folate: 6.9 ng/mL (ref >3.0)
Vitamin B-12: 576 pg/mL (ref 232–1245)

## 2019-01-29 LAB — COMPREHENSIVE METABOLIC PANEL
ALT: 30 IU/L (ref 0–32)
AST: 24 IU/L (ref 0–40)
Albumin/Globulin Ratio: 1.8 (ref 1.2–2.2)
Albumin: 4.4 g/dL (ref 3.8–4.8)
Alkaline Phosphatase: 49 IU/L (ref 39–117)
BUN/Creatinine Ratio: 18 (ref 9–23)
BUN: 14 mg/dL (ref 6–24)
Bilirubin Total: 0.4 mg/dL (ref 0.0–1.2)
CO2: 26 mmol/L (ref 20–29)
Calcium: 9.3 mg/dL (ref 8.7–10.2)
Chloride: 101 mmol/L (ref 96–106)
Creatinine, Ser: 0.8 mg/dL (ref 0.57–1.00)
GFR calc Af Amer: 106 mL/min/{1.73_m2} (ref >59)
GFR calc non Af Amer: 92 mL/min/{1.73_m2} (ref >59)
Globulin, Total: 2.5 g/dL (ref 1.5–4.5)
Glucose: 90 mg/dL (ref 65–99)
Potassium: 4 mmol/L (ref 3.5–5.2)
Sodium: 140 mmol/L (ref 134–144)
Total Protein: 6.9 g/dL (ref 6.0–8.5)

## 2019-01-29 LAB — IRON AND TIBC
Iron Saturation: 25 % (ref 15–55)
Iron: 84 ug/dL (ref 27–159)
Total Iron Binding Capacity: 333 ug/dL (ref 250–450)
UIBC: 249 ug/dL (ref 131–425)

## 2019-01-29 LAB — FERRITIN: Ferritin: 58 ng/mL (ref 15–150)

## 2019-01-29 LAB — HGB A1C W/O EAG: Hgb A1c MFr Bld: 5.1 % (ref 4.8–5.6)

## 2019-01-29 LAB — T4, FREE: Free T4: 1.15 ng/dL (ref 0.82–1.77)

## 2019-03-05 ENCOUNTER — Other Ambulatory Visit: Payer: Self-pay

## 2019-03-05 ENCOUNTER — Encounter: Payer: Self-pay | Admitting: Adult Health

## 2019-03-05 ENCOUNTER — Ambulatory Visit (INDEPENDENT_AMBULATORY_CARE_PROVIDER_SITE_OTHER): Payer: BLUE CROSS/BLUE SHIELD | Admitting: Adult Health

## 2019-03-05 VITALS — Temp 97.8°F | Ht 64.0 in | Wt 176.0 lb

## 2019-03-05 DIAGNOSIS — R21 Rash and other nonspecific skin eruption: Secondary | ICD-10-CM | POA: Diagnosis not present

## 2019-03-05 DIAGNOSIS — L237 Allergic contact dermatitis due to plants, except food: Secondary | ICD-10-CM | POA: Diagnosis not present

## 2019-03-05 MED ORDER — HYDROCORTISONE 1 % EX OINT: 1.0000 "application " | TOPICAL_OINTMENT | Freq: Two times a day (BID) | CUTANEOUS | 0 refills | Status: DC

## 2019-03-05 MED ORDER — VALACYCLOVIR HCL 1 G PO TABS: 1000.0000 mg | ORAL_TABLET | Freq: Two times a day (BID) | ORAL | 0 refills | Status: DC

## 2019-03-05 NOTE — Progress Notes (Signed)
[facility] [place], [place] [address]  Internal MEDICINE  Telephone Visit  Patient Name: Jamie Meadows  [id number]  [id number]  Date of Service: 03/05/2019  I connected with the patient at 1154 by telephone and verified the patients identity using two identifiers.   I discussed the limitations, risks, security and privacy concerns of performing an evaluation and management service by telephone and the availability of in person appointments. I also discussed with the patient that there may be a patient responsible charge related to the service.  The patient expressed understanding and agrees to proceed.    Chief Complaint  Patient presents with  . Telephone Assessment  . Telephone Screen  . Rash    lower back and exposure to shingles     HPI  PT seen via video.  She reports 5 days ago she noticed an itchy spot.  She is concerned it may be shingles, since her mother recently had them.  She denies any pain, and complains only of itching.  She herself recently had poison ivy on her feet.     Current Medication: Outpatient Encounter Medications as of 03/05/2019  Medication Sig  . amphetamine-dextroamphetamine (ADDERALL) 20 MG tablet Take 1 tablet (20 mg total) by mouth 2 (two) times daily.  Marland Kitchen amphetamine-dextroamphetamine (ADDERALL) 20 MG tablet Take 1 tablet (20 mg total) by mouth 2 (two) times daily.  Marland Kitchen amphetamine-dextroamphetamine (ADDERALL) 20 MG tablet Take 1 tablet (20 mg total) by mouth daily.  . fluticasone (FLONASE) 50 MCG/ACT nasal spray Place 2 sprays into both nostrils daily.  Marland Kitchen levonorgestrel (MIRENA) 20 MCG/24HR IUD 1 each by Intrauterine route once.  . loratadine (CLARITIN) 10 MG tablet Take 1 tablet (10 mg total) by mouth daily.  . Multiple Vitamin (MULTIVITAMIN) tablet Take 1 tablet by mouth daily.  . hydrocortisone 1 % ointment Apply 1 application topically 2 (two) times daily.  . valACYclovir (VALTREX) 1000 MG tablet Take 1 tablet  (1,000 mg total) by mouth 2 (two) times daily.   No facility-administered encounter medications on file as of 03/05/2019.     Surgical History: Past Surgical History:  Procedure Laterality Date  . ABDOMINOPLASTY  2017  . COLPOSCOPY  09/21/2015  . WISDOM TOOTH EXTRACTION      Medical History: Past Medical History:  Diagnosis Date  . Allergic rhinitis   . Fracture of clavicle with routine healing right  . History of abnormal cervical Pap smear 2017   LGSIL with HRHPV  . Sinusitis   . Wrist fracture, closed    left    Family History: Family History  Problem Relation Age of Onset  . Diabetes Mother   . Kidney disease Mother   . Allergies Father   . Breast cancer Maternal Aunt 56  . Diabetes Maternal Grandfather   . Hypertension Maternal Grandfather     Social History   Socioeconomic History  . Marital status: Legally Separated    Spouse name: Not on file  . Number of children: 4  . Years of education: 59  . Highest education level: Not on file  Occupational History  . 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 Financial resource strain: Not on file  . Food insecurity    Worry: Not on file    Inability: Not on file  . Transportation needs    Medical: Not on file    Non-medical: Not on file  Tobacco Use  . Smoking status: Former Research scientist (life sciences)  . Smokeless tobacco: Never  Used  Substance and Sexual Activity  . Alcohol use: Yes    Alcohol/week: 7.0 standard drinks    Types: 7 Glasses of wine per week    Comment: occasionally  . Drug use: Never  . Sexual activity: Yes    Birth control/protection: I.U.D.  Lifestyle  . Physical activity    Days per week: 4 days    Minutes per session: 30 min  . Stress: Not at all  Relationships  . Social Musicianconnections    Talks on phone: Not on file    Gets together: Not on file    Attends religious service: Not on file    Active member of club or organization: Not on file    Attends meetings of clubs or organizations:  Not on file    Relationship status: Not on file  . Intimate partner violence    Fear of current or ex partner: Not on file    Emotionally abused: Not on file    Physically abused: Not on file    Forced sexual activity: Not on file  Other Topics Concern  . Not on file  Social History Narrative  . Not on file      Review of Systems  Constitutional: Negative for chills, fatigue and unexpected weight change.  HENT: Negative for congestion, rhinorrhea, sneezing and sore throat.   Eyes: Negative for photophobia, pain and redness.  Respiratory: Negative for cough, chest tightness and shortness of breath.   Cardiovascular: Negative for chest pain and palpitations.  Gastrointestinal: Negative for abdominal pain, constipation, diarrhea, nausea and vomiting.  Endocrine: Negative.   Genitourinary: Negative for dysuria and frequency.  Musculoskeletal: Negative for arthralgias, back pain, joint swelling and neck pain.  Skin: Positive for rash.       Two small areas of raised red, itchy rash on side and abdomen.   Allergic/Immunologic: Negative.   Neurological: Negative for tremors and numbness.  Hematological: Negative for adenopathy. Does not bruise/bleed easily.  Psychiatric/Behavioral: Negative for behavioral problems and sleep disturbance. The patient is not nervous/anxious.     Vital Signs: Temp 97.8 F (36.6 C)   Ht 5\' 4"  (1.626 m)   Wt 176 lb (79.8 kg)   BMI 30.21 kg/m    Observation/Objective:  Well appearing, NAD noted.   Assessment/Plan: 1. Poison ivy dermatitis Use Hydrocortisone as directed. Follow up if symptoms worsen, or do not improve.  - hydrocortisone 1 % ointment; Apply 1 application topically 2 (two) times daily.  Dispense: 30 g; Refill: 0  2. Rash PT is going out of town with her immunocompromised sister, will treat for shingles also.   - valACYclovir (VALTREX) 1000 MG tablet; Take 1 tablet (1,000 mg total) by mouth 2 (two) times daily.  Dispense: 14  tablet; Refill: 0  General Counseling: Victorino DikeJennifer verbalizes understanding of the findings of today's phone visit and agrees with plan of treatment. I have discussed any further diagnostic evaluation that may be needed or ordered today. We also reviewed her medications today. she has been encouraged to call the office with any questions or concerns that should arise related to todays visit.    No orders of the defined types were placed in this encounter.   Meds ordered this encounter  Medications  . valACYclovir (VALTREX) 1000 MG tablet    Sig: Take 1 tablet (1,000 mg total) by mouth 2 (two) times daily.    Dispense:  14 tablet    Refill:  0  . hydrocortisone 1 % ointment  Sig: Apply 1 application topically 2 (two) times daily.    Dispense:  30 g    Refill:  0    Time spent: 15 Minutes    Blima Ledger AGNP-C Internal medicine

## 2019-03-23 ENCOUNTER — Other Ambulatory Visit: Payer: Self-pay

## 2019-03-23 ENCOUNTER — Ambulatory Visit (INDEPENDENT_AMBULATORY_CARE_PROVIDER_SITE_OTHER): Payer: BLUE CROSS/BLUE SHIELD | Admitting: Nurse Practitioner

## 2019-03-23 ENCOUNTER — Encounter: Payer: Self-pay | Admitting: Nurse Practitioner

## 2019-03-23 VITALS — BP 106/69 | HR 82 | Temp 98.4°F | Resp 16 | Ht 64.0 in | Wt 180.2 lb

## 2019-03-23 DIAGNOSIS — J301 Allergic rhinitis due to pollen: Secondary | ICD-10-CM | POA: Diagnosis not present

## 2019-03-23 DIAGNOSIS — Z79899 Other long term (current) drug therapy: Secondary | ICD-10-CM | POA: Diagnosis not present

## 2019-03-23 DIAGNOSIS — R41840 Attention and concentration deficit: Secondary | ICD-10-CM

## 2019-03-23 LAB — POCT URINE DRUG SCREEN
POC Amphetamine UR: NOT DETECTED
POC BENZODIAZEPINES UR: NOT DETECTED
POC Barbiturate UR: NOT DETECTED
POC Cocaine UR: NOT DETECTED
POC Ecstasy UR: NOT DETECTED
POC Marijuana UR: NOT DETECTED
POC Methadone UR: NOT DETECTED
POC Methamphetamine UR: NOT DETECTED
POC Opiate Ur: NOT DETECTED
POC Oxycodone UR: NOT DETECTED
POC PHENCYCLIDINE UR: NOT DETECTED
POC TRICYCLICS UR: NOT DETECTED

## 2019-03-23 MED ORDER — AMPHETAMINE-DEXTROAMPHETAMINE 30 MG PO TABS: 30.0000 mg | ORAL_TABLET | Freq: Every day | ORAL | 0 refills | Status: DC

## 2019-03-23 NOTE — Progress Notes (Signed)
[facility] [address] [place], [place] [address]  Internal MEDICINE  Office Visit Note  Patient Name: Jamie Meadows  [id number]  [id number]  Date of Service: 03/23/2019  Chief Complaint  Patient presents with  . Follow-up    weight management   . ADD  . Medication Refill    adderall    Pt is here for follow up on ADD.  She is currently taking 20mg  of adderall BID.  States that most days, she needs to have both doses of adderall. States that she still feels fatigued and sluggish. She is also concerned about slow weight loss. Has lost three pounds since she was last here. Is getting out of the home more often and is more active. She denies negative side effects associated with the medication. .       Current Medication: Outpatient Encounter Medications as of 03/23/2019  Medication Sig  . fluticasone (FLONASE) 50 MCG/ACT nasal spray Place 2 sprays into both nostrils daily.  . hydrocortisone 1 % ointment Apply 1 application topically 2 (two) times daily.  13/06/2018 levonorgestrel (MIRENA) 20 MCG/24HR IUD 1 each by Intrauterine route once.  . loratadine (CLARITIN) 10 MG tablet Take 1 tablet (10 mg total) by mouth daily.  . Multiple Vitamin (MULTIVITAMIN) tablet Take 1 tablet by mouth daily.  . valACYclovir (VALTREX) 1000 MG tablet Take 1 tablet (1,000 mg total) by mouth 2 (two) times daily.  . [DISCONTINUED] amphetamine-dextroamphetamine (ADDERALL) 20 MG tablet Take 1 tablet (20 mg total) by mouth 2 (two) times daily.  . [DISCONTINUED] amphetamine-dextroamphetamine (ADDERALL) 20 MG tablet Take 1 tablet (20 mg total) by mouth 2 (two) times daily.  . [DISCONTINUED] amphetamine-dextroamphetamine (ADDERALL) 20 MG tablet Take 1 tablet (20 mg total) by mouth daily.  Marland Kitchen amphetamine-dextroamphetamine (ADDERALL) 30 MG tablet Take 1 tablet by mouth daily.  . [DISCONTINUED] amphetamine-dextroamphetamine (ADDERALL) 30 MG tablet Take 1 tablet by mouth daily.  . [DISCONTINUED]  amphetamine-dextroamphetamine (ADDERALL) 30 MG tablet Take 1 tablet by mouth daily.   No facility-administered encounter medications on file as of 03/23/2019.     Surgical History: Past Surgical History:  Procedure Laterality Date  . ABDOMINOPLASTY  2017  . COLPOSCOPY  09/21/2015  . WISDOM TOOTH EXTRACTION      Medical History: Past Medical History:  Diagnosis Date  . Allergic rhinitis   . Fracture of clavicle with routine healing right  . History of abnormal cervical Pap smear 2017   LGSIL with HRHPV  . Sinusitis   . Wrist fracture, closed    left    Family History: Family History  Problem Relation Age of Onset  . Diabetes Mother   . Kidney disease Mother   . Allergies Father   . Breast cancer Maternal Aunt 50  . Diabetes Maternal Grandfather   . Hypertension Maternal Grandfather     Social History   Socioeconomic History  . Marital status: Legally Separated    Spouse name: Not on file  . Number of children: 4  . Years of education: 60  . Highest education level: Not on file  Occupational History  . 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 Financial resource strain: Not on file  . Food insecurity    Worry: Not on file    Inability: Not on file  . Transportation needs    Medical: Not on file    Non-medical: Not on file  Tobacco Use  . Smoking status: Former 14  . Smokeless tobacco: Never Used  Substance and Sexual Activity  . Alcohol use: Yes    Alcohol/week: 7.0 standard drinks    Types: 7 Glasses of wine per week    Comment: occasionally  . Drug use: Never  . Sexual activity: Yes    Birth control/protection: I.U.D.  Lifestyle  . Physical activity    Days per week: 4 days    Minutes per session: 30 min  . Stress: Not at all  Relationships  . Social Musicianconnections    Talks on phone: Not on file    Gets together: Not on file    Attends religious service: Not on file    Active member of club or organization: Not on file    Attends  meetings of clubs or organizations: Not on file    Relationship status: Not on file  . Intimate partner violence    Fear of current or ex partner: Not on file    Emotionally abused: Not on file    Physically abused: Not on file    Forced sexual activity: Not on file  Other Topics Concern  . Not on file  Social History Narrative  . Not on file      Review of Systems  Constitutional: Negative for chills, fatigue and unexpected weight change.  HENT: Positive for congestion, postnasal drip and rhinorrhea. Negative for sneezing and sore throat.   Respiratory: Negative for cough, chest tightness, shortness of breath and wheezing.   Cardiovascular: Negative for chest pain and palpitations.  Gastrointestinal: Negative for abdominal pain, constipation, diarrhea, nausea and vomiting.  Endocrine: Negative for cold intolerance, heat intolerance, polydipsia and polyuria.  Musculoskeletal: Negative for arthralgias, back pain, joint swelling and neck pain.  Skin: Negative for rash.  Allergic/Immunologic: Positive for environmental allergies.  Neurological: Negative for dizziness, tremors, numbness and headaches.  Hematological: Negative for adenopathy. Does not bruise/bleed easily.  Psychiatric/Behavioral: Positive for decreased concentration. Negative for behavioral problems and sleep disturbance. The patient is not nervous/anxious.    Today's Vitals   03/23/19 0828  BP: 106/69  Pulse: 82  Resp: 16  Temp: 98.4 F (36.9 C)  SpO2: 100%  Weight: 180 lb 3.2 oz (81.7 kg)  Height: 5\' 4"  (1.626 m)   Body mass index is 30.93 kg/m.  Physical Exam Vitals signs and nursing note reviewed.  Constitutional:      General: She is not in acute distress.    Appearance: Normal appearance. She is well-developed. She is not diaphoretic.  HENT:     Head: Normocephalic and atraumatic.     Nose: Nose normal.     Mouth/Throat:     Pharynx: No oropharyngeal exudate.  Eyes:     Conjunctiva/sclera:  Conjunctivae normal.     Pupils: Pupils are equal, round, and reactive to light.  Neck:     Musculoskeletal: Normal range of motion and neck supple.     Thyroid: No thyromegaly.     Vascular: No JVD.     Trachea: No tracheal deviation.  Cardiovascular:     Rate and Rhythm: Normal rate and regular rhythm.     Heart sounds: Normal heart sounds. No murmur. No friction rub. No gallop.   Pulmonary:     Effort: Pulmonary effort is normal. No respiratory distress.     Breath sounds: Normal breath sounds. No wheezing or rales.  Chest:     Chest wall: No tenderness.  Abdominal:     Tenderness: There is no abdominal tenderness.  Musculoskeletal: Normal range of motion.  Lymphadenopathy:  Cervical: No cervical adenopathy.  Skin:    General: Skin is warm and dry.  Neurological:     Mental Status: She is alert and oriented to person, place, and time.     Cranial Nerves: No cranial nerve deficit.  Psychiatric:        Mood and Affect: Mood normal.        Behavior: Behavior normal.        Thought Content: Thought content normal.        Judgment: Judgment normal.    Assessment/Plan: 1. Seasonal allergic rhinitis due to pollen contineu allergy medication and treatment as prescribed   2. Attention and concentration deficit Increase adderall to 30mg  twice daily. Three 30 day prescriptions were sent to her pharmacy. Dates are 03/23/2019, 04/20/2019, and 05/18/2019 - amphetamine-dextroamphetamine (ADDERALL) 30 MG tablet; Take 1 tablet by mouth daily.  Dispense: 60 tablet; Refill: 0  3. Encounter for long-term (current) use of medications - POCT Urine Drug Screen negative for all controlled medications. Appropriate as she does not take adderall on the weekends and has not taken it yet today   General Counseling: Kathia verbalizes understanding of the findings of todays visit and agrees with plan of treatment. I have discussed any further diagnostic evaluation that may be needed or ordered  today. We also reviewed her medications today. she has been encouraged to call the office with any questions or concerns that should arise related to todays visit.  Refilled Controlled medications today. Reviewed risks and possible side effects associated with taking Stimulants. Combination of these drugs with other psychotropic medications could cause dizziness and drowsiness. Pt needs to Monitor symptoms and exercise caution in driving and operating heavy machinery to avoid damages to oneself, to others and to the surroundings. Patient verbalized understanding in this matter. Dependence and abuse for these drugs will be monitored closely. A Controlled substance policy and procedure is on file which allows [facility] to order a urine drug screen test at any visit. Patient understands and agrees with the plan..  This patient was seen by Leretha Pol FNP Collaboration with Dr Lavera Guise as a part of collaborative care agreement  Orders Placed This Encounter  Procedures  . POCT Urine Drug Screen    Meds ordered this encounter  Medications  . DISCONTD: amphetamine-dextroamphetamine (ADDERALL) 30 MG tablet    Sig: Take 1 tablet by mouth daily.    Dispense:  60 tablet    Refill:  0    Order Specific Question:   Supervising Provider    Answer:   Lavera Guise [[id number]]  . DISCONTD: amphetamine-dextroamphetamine (ADDERALL) 30 MG tablet    Sig: Take 1 tablet by mouth daily.    Dispense:  60 tablet    Refill:  0    Fill after 04/20/2019    Order Specific Question:   Supervising Provider    Answer:   Lavera Guise [[id number]]  . amphetamine-dextroamphetamine (ADDERALL) 30 MG tablet    Sig: Take 1 tablet by mouth daily.    Dispense:  60 tablet    Refill:  0    Fill after 05/18/2019    Order Specific Question:   Supervising Provider    Answer:   Lavera Guise [[id number]]    Time spent: 25 Minutes      Dr Lavera Guise Internal medicine

## 2019-03-31 ENCOUNTER — Other Ambulatory Visit: Payer: Self-pay

## 2019-03-31 DIAGNOSIS — Z20822 Contact with and (suspected) exposure to covid-19: Secondary | ICD-10-CM

## 2019-04-02 LAB — SPECIMEN STATUS REPORT

## 2019-04-02 LAB — NOVEL CORONAVIRUS, NAA: SARS-CoV-2, NAA: NOT DETECTED

## 2019-04-02 NOTE — Progress Notes (Signed)
Please let the patient know her COVID 19 test was negative. Thanks.

## 2019-05-04 ENCOUNTER — Other Ambulatory Visit: Payer: Self-pay | Admitting: Nurse Practitioner

## 2019-05-04 ENCOUNTER — Telehealth: Payer: Self-pay

## 2019-05-04 ENCOUNTER — Other Ambulatory Visit: Payer: Self-pay

## 2019-05-04 ENCOUNTER — Encounter: Payer: Self-pay | Admitting: Obstetrics & Gynecology

## 2019-05-04 ENCOUNTER — Ambulatory Visit (INDEPENDENT_AMBULATORY_CARE_PROVIDER_SITE_OTHER): Payer: BLUE CROSS/BLUE SHIELD | Admitting: Obstetrics & Gynecology

## 2019-05-04 ENCOUNTER — Other Ambulatory Visit (HOSPITAL_COMMUNITY)
Admission: RE | Admit: 2019-05-04 | Payer: BLUE CROSS/BLUE SHIELD | Source: Ambulatory Visit | Admitting: Obstetrics & Gynecology

## 2019-05-04 VITALS — BP 120/80 | Ht 64.0 in | Wt 178.0 lb

## 2019-05-04 DIAGNOSIS — Z87410 Personal history of cervical dysplasia: Secondary | ICD-10-CM

## 2019-05-04 DIAGNOSIS — Z1231 Encounter for screening mammogram for malignant neoplasm of breast: Secondary | ICD-10-CM | POA: Diagnosis not present

## 2019-05-04 DIAGNOSIS — R41840 Attention and concentration deficit: Secondary | ICD-10-CM

## 2019-05-04 MED ORDER — AMPHETAMINE-DEXTROAMPHETAMINE 30 MG PO TABS: 30.0000 mg | ORAL_TABLET | Freq: Every day | ORAL | 0 refills | Status: DC

## 2019-05-04 MED ORDER — AMPHETAMINE-DEXTROAMPHETAMINE 30 MG PO TABS: 30.0000 mg | ORAL_TABLET | Freq: Two times a day (BID) | ORAL | 0 refills | Status: DC

## 2019-05-04 NOTE — Telephone Encounter (Signed)
PLEASE CHANGE DIRECTIONS AND RESEND RX. THANK YOU.

## 2019-05-04 NOTE — Progress Notes (Signed)
Corrected instructions on adderall to BID when needed. Sent new prescription to be filled now. Sent second, post dated for 06/02/2019. She has follow up 06/23/2019.

## 2019-05-04 NOTE — Progress Notes (Signed)
HPI:  Patient is a 43 y.o. [id number] presenting for follow up evaluation of abnormal PAP smear in the past.  Her last PAP was 18  months ago and was abnormal: ASCUS w POS HPV on 08/2017. She has had a prior colposcopy. Prior biopsies (if done) were Normal.  She has not yet had follow up PAP.  Also had LGSIL 2017.  PMHx: She  has a past medical history of Allergic rhinitis, Fracture of clavicle with routine healing (right), History of abnormal cervical Pap smear (2017), Sinusitis, and Wrist fracture, closed. Also,  has a past surgical history that includes Abdominoplasty (2017); Wisdom tooth extraction; and Colposcopy (09/21/2015)., family history includes Allergies in her father; Breast cancer (age of onset: 54) in her maternal aunt; Cancer in her sister; Diabetes in her maternal grandfather and mother; Hypertension in her maternal grandfather; Kidney disease in her mother.,  reports that she has quit smoking. She has never used smokeless tobacco. She reports current alcohol use of about 7.0 standard drinks of alcohol per week. She reports that she does not use drugs.  She has a current medication list which includes the following prescription(s): amphetamine-dextroamphetamine, fluticasone, hydrocortisone, levonorgestrel, loratadine, multivitamin, and valacyclovir. Also, has No Known Allergies.  Review of Systems  All other systems reviewed and are negative.   Objective: BP 120/80   Ht 5\' 4"  (1.626 m)   Wt 178 lb (80.7 kg)   BMI 30.55 kg/m  Filed Weights   05/04/19 1445  Weight: 178 lb (80.7 kg)   Body mass index is 30.55 kg/m.  Physical examination Physical Exam Constitutional:      General: She is not in acute distress.    Appearance: She is well-developed.  Genitourinary:     Pelvic exam was performed with patient supine.     Vagina and uterus normal.     No vaginal erythema or bleeding.     No cervical motion tenderness, discharge, polyp or nabothian cyst.     IUD strings  visualized.     Uterus is mobile.     Uterus is not enlarged.     No uterine mass detected.    Uterus is midaxial.     No right or left adnexal mass present.     Right adnexa not tender.     Left adnexa not tender.     Genitourinary Comments: Cervix normal appearance  HENT:     Head: Normocephalic and atraumatic.     Nose: Nose normal.  Abdominal:     General: There is no distension.     Palpations: Abdomen is soft.     Tenderness: There is no abdominal tenderness.  Musculoskeletal:        General: Normal range of motion.  Neurological:     Mental Status: She is alert and oriented to person, place, and time.     Cranial Nerves: No cranial nerve deficit.  Skin:    General: Skin is warm and dry.  Psychiatric:        Attention and Perception: Attention normal.        Mood and Affect: Mood and affect normal.        Speech: Speech normal.        Behavior: Behavior normal.        Thought Content: Thought content normal.        Judgment: Judgment normal.     ASSESSMENT:  History of Cervical Dysplasia  Plan:  1.  I discussed the grading system of pap smears  and HPV high risk viral types.   2. Follow up PAP 6 months, vs intervention if high grade dysplasia identified. 3. Treatment of persistantly abnormal PAP smears and cervical dysplasia, even mild, is discussed w pt today in detail, as well as the pros and cons of Cryo and LEEP procedures. Will consider and discuss after results. 4. Cont IUD for contraception, controls periods well also  Annamarie Major, MD, [facility], [facility] 05/04/2019  3:56 PM

## 2019-05-04 NOTE — Patient Instructions (Signed)
PAP every 6 months Mammogram every year    Call [phone] to schedule at [place]  

## 2019-05-04 NOTE — Telephone Encounter (Signed)
Corrected instructions on adderall to BID when needed. Sent new prescription to be filled now. Sent second, post dated for 06/02/2019. She has follow up 06/23/2019.

## 2019-05-04 NOTE — Telephone Encounter (Signed)
It absolutely should be twice daily as needed. Did they fill it with sixty tablets?

## 2019-05-06 LAB — CYTOLOGY - PAP: Diagnosis: NEGATIVE

## 2019-06-19 ENCOUNTER — Telehealth: Payer: Self-pay

## 2019-06-19 NOTE — Telephone Encounter (Signed)
lmom to confirm 06-23-19 ov as virtual.

## 2019-06-23 ENCOUNTER — Encounter: Payer: Self-pay | Admitting: Nurse Practitioner

## 2019-06-23 ENCOUNTER — Other Ambulatory Visit: Payer: Self-pay

## 2019-06-23 ENCOUNTER — Ambulatory Visit (INDEPENDENT_AMBULATORY_CARE_PROVIDER_SITE_OTHER): Payer: BLUE CROSS/BLUE SHIELD | Admitting: Nurse Practitioner

## 2019-06-23 VITALS — BP 124/79 | HR 83 | Temp 97.3°F | Resp 16 | Ht 64.0 in | Wt 180.4 lb

## 2019-06-23 DIAGNOSIS — J309 Allergic rhinitis, unspecified: Secondary | ICD-10-CM | POA: Diagnosis not present

## 2019-06-23 DIAGNOSIS — R41840 Attention and concentration deficit: Secondary | ICD-10-CM | POA: Diagnosis not present

## 2019-06-23 MED ORDER — AMPHETAMINE-DEXTROAMPHETAMINE 30 MG PO TABS: 30.0000 mg | ORAL_TABLET | Freq: Two times a day (BID) | ORAL | 0 refills | Status: DC

## 2019-06-23 NOTE — Progress Notes (Signed)
[facility] [address] [place], [place] [address]  Internal MEDICINE  Office Visit Note  Patient Name: Jamie Meadows  [id number]  [id number]  Date of Service: 06/23/2019  Chief Complaint  Patient presents with  . ADD  . Medication Refill    adderall    The patient is here for follow up visit. She states that she is having trouble with losing weight due to pandemic and col weather. Not able to get to the gym like she wants and unable to really get outside much. Weight has been stable for last several months.   She is currently taking 30mg  of adderall BID. Doing better since dosing was increased to 30mg  twice daily. Taking only when needed. She denies negative side effects associated with the medication.        Current Medication: Outpatient Encounter Medications as of 06/23/2019  Medication Sig  . fluticasone (FLONASE) 50 MCG/ACT nasal spray Place 2 sprays into both nostrils daily.  . hydrocortisone 1 % ointment Apply 1 application topically 2 (two) times daily.  levonorgestrel (MIRENA) 20 MCG/24HR IUD 1 each by Intrauterine route once.  . loratadine (CLARITIN) 10 MG tablet Take 1 tablet (10 mg total) by mouth daily.  . Multiple Vitamin (MULTIVITAMIN) tablet Take 1 tablet by mouth daily.  . valACYclovir (VALTREX) 1000 MG tablet Take 1 tablet (1,000 mg total) by mouth 2 (two) times daily.  . [DISCONTINUED] amphetamine-dextroamphetamine (ADDERALL) 30 MG tablet Take 1 tablet by mouth 2 (two) times daily.  . [DISCONTINUED] amphetamine-dextroamphetamine (ADDERALL) 30 MG tablet Take 1 tablet by mouth 2 (two) times daily.  . [DISCONTINUED] amphetamine-dextroamphetamine (ADDERALL) 30 MG tablet Take 1 tablet by mouth 2 (two) times daily.  08/21/2019 amphetamine-dextroamphetamine (ADDERALL) 30 MG tablet Take 1 tablet by mouth 2 (two) times daily.   No facility-administered encounter medications on file as of 06/23/2019.    Surgical History: Past Surgical History:  Procedure  Laterality Date  . ABDOMINOPLASTY  2017  . COLPOSCOPY  09/21/2015  . WISDOM TOOTH EXTRACTION      Medical History: Past Medical History:  Diagnosis Date  . Allergic rhinitis   . Fracture of clavicle with routine healing right  . History of abnormal cervical Pap smear 2017   LGSIL with HRHPV  . Sinusitis   . Wrist fracture, closed    left    Family History: Family History  Problem Relation Age of Onset  . Diabetes Mother   . Kidney disease Mother   . Allergies Father   . Breast cancer Maternal Aunt 50  . Diabetes Maternal Grandfather   . Hypertension Maternal Grandfather   . Cancer Sister     Social History   Socioeconomic History  . Marital status: Legally Separated    Spouse name: Not on file  . Number of children: 4  . Years of education: 73  . Highest education level: Not on file  Occupational History  . 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 Smoking status: Former 2018  . Smokeless tobacco: Never Used  Substance and Sexual Activity  . Alcohol use: Yes    Alcohol/week: 7.0 standard drinks    Types: 7 Glasses of wine per week    Comment: occasionally  . Drug use: Never  . Sexual activity: Yes    Birth control/protection: I.U.D.  Other Topics Concern  . Not on file  Social History Narrative  . Not on file   Social Determinants of Health   Financial Resource Strain:   .  Difficulty of Paying Living Expenses: Not on file  Food Insecurity:   . Worried About Charity fundraiser in the Last Year: Not on file  . Ran Out of Food in the Last Year: Not on file  Transportation Needs:   . Lack of Transportation (Medical): Not on file  . Lack of Transportation (Non-Medical): Not on file  Physical Activity:   . Days of Exercise per Week: Not on file  . Minutes of Exercise per Session: Not on file  Stress:   . Feeling of Stress : Not on file  Social Connections:   . Frequency of Communication with Friends and Family: Not on file  . Frequency of  Social Gatherings with Friends and Family: Not on file  . Attends Religious Services: Not on file  . Active Member of Clubs or Organizations: Not on file  . Attends Archivist Meetings: Not on file  . Marital Status: Not on file  Intimate Partner Violence:   . Fear of Current or Ex-Partner: Not on file  . Emotionally Abused: Not on file  . Physically Abused: Not on file  . Sexually Abused: Not on file      Review of Systems  Constitutional: Negative for activity change, chills, fatigue and unexpected weight change.  HENT: Negative for congestion, postnasal drip, rhinorrhea, sneezing and sore throat.   Respiratory: Negative for cough, chest tightness, shortness of breath and wheezing.   Cardiovascular: Negative for chest pain and palpitations.  Gastrointestinal: Negative for abdominal pain, constipation, diarrhea, nausea and vomiting.  Endocrine: Negative for cold intolerance, heat intolerance and polydipsia.  Musculoskeletal: Negative for arthralgias, back pain, joint swelling and neck pain.  Skin: Negative for rash.  Allergic/Immunologic: Positive for environmental allergies.  Neurological: Negative for dizziness, tremors, numbness and headaches.  Hematological: Negative for adenopathy. Does not bruise/bleed easily.  Psychiatric/Behavioral: Positive for decreased concentration. Negative for behavioral problems (Depression), sleep disturbance and suicidal ideas. The patient is not nervous/anxious.     Today's Vitals   06/23/19 0911  BP: 124/79  Pulse: 83  Resp: 16  Temp: (!) 97.3 F (36.3 C)  SpO2: 99%  Weight: 180 lb 6.4 oz (81.8 kg)  Height: 5\' 4"  (1.626 m)   Body mass index is 30.97 kg/m.  Physical Exam Vitals and nursing note reviewed.  Constitutional:      General: She is not in acute distress.    Appearance: Normal appearance. She is well-developed. She is not diaphoretic.  HENT:     Head: Normocephalic and atraumatic.     Mouth/Throat:     Pharynx:  No oropharyngeal exudate.  Eyes:     Pupils: Pupils are equal, round, and reactive to light.  Neck:     Thyroid: No thyromegaly.     Vascular: No JVD.     Trachea: No tracheal deviation.  Cardiovascular:     Rate and Rhythm: Normal rate and regular rhythm.     Heart sounds: Normal heart sounds. No murmur. No friction rub. No gallop.   Pulmonary:     Effort: Pulmonary effort is normal. No respiratory distress.     Breath sounds: Normal breath sounds. No wheezing or rales.  Chest:     Chest wall: No tenderness.  Abdominal:     General: Bowel sounds are normal.     Palpations: Abdomen is soft.  Musculoskeletal:        General: Normal range of motion.     Cervical back: Normal range of motion and neck supple.  Lymphadenopathy:     Cervical: No cervical adenopathy.  Skin:    General: Skin is warm and dry.  Neurological:     Mental Status: She is alert and oriented to person, place, and time.     Cranial Nerves: No cranial nerve deficit.  Psychiatric:        Mood and Affect: Mood normal.        Behavior: Behavior normal.        Thought Content: Thought content normal.        Judgment: Judgment normal.     Assessment/Plan: 1. Chronic allergic rhinitis Patient has been off allergy injections for few months. Currently using OTC medication. Symptoms stable.   2. Attention and concentration deficit May continue adderall 30mg  BID when needed. Three 30 day prescriptions sent to her pharmacy. Dates are 06/23/2019, 07/21/2019, and 08/18/2019 - amphetamine-dextroamphetamine (ADDERALL) 30 MG tablet; Take 1 tablet by mouth 2 (two) times daily.  Dispense: 60 tablet; Refill: 0  General Counseling: Syla verbalizes understanding of the findings of todays visit and agrees with plan of treatment. I have discussed any further diagnostic evaluation that may be needed or ordered today. We also reviewed her medications today. she has been encouraged to call the office with any questions or concerns  that should arise related to todays visit.   Refilled Controlled medications today. Reviewed risks and possible side effects associated with taking Stimulants. Combination of these drugs with other psychotropic medications could cause dizziness and drowsiness. Pt needs to Monitor symptoms and exercise caution in driving and operating heavy machinery to avoid damages to oneself, to others and to the surroundings. Patient verbalized understanding in this matter. Dependence and abuse for these drugs will be monitored closely. A Controlled substance policy and procedure is on file which allows [facility] to order a urine drug screen test at any visit. Patient understands and agrees with the plan..  This patient was seen by [place] FNP Collaboration with Dr Vincent Gros as a part of collaborative care agreement  Meds ordered this encounter  Medications  . DISCONTD: amphetamine-dextroamphetamine (ADDERALL) 30 MG tablet    Sig: Take 1 tablet by mouth 2 (two) times daily.    Dispense:  60 tablet    Refill:  0    Order Specific Question:   Supervising Provider    Answer:   Lyndon Code [[id number]]  . DISCONTD: amphetamine-dextroamphetamine (ADDERALL) 30 MG tablet    Sig: Take 1 tablet by mouth 2 (two) times daily.    Dispense:  60 tablet    Refill:  0    Fill after 07/21/2019    Order Specific Question:   Supervising Provider    Answer:   09/20/2019 [1408]  . amphetamine-dextroamphetamine (ADDERALL) 30 MG tablet    Sig: Take 1 tablet by mouth 2 (two) times daily.    Dispense:  60 tablet    Refill:  0    Fill after 08/18/2019    Order Specific Question:   Supervising Provider    Answer:   08/20/2019 [1408]    Total time spent: 20 Minutes  Time spent includes review of chart, medications, test results, and follow up plan with the patient.      Dr Lyndon Code Internal medicine

## 2019-07-06 ENCOUNTER — Other Ambulatory Visit: Payer: Self-pay

## 2019-07-06 ENCOUNTER — Encounter: Payer: Self-pay | Admitting: Adult Health

## 2019-07-06 ENCOUNTER — Ambulatory Visit (INDEPENDENT_AMBULATORY_CARE_PROVIDER_SITE_OTHER): Payer: BLUE CROSS/BLUE SHIELD | Admitting: Adult Health

## 2019-07-06 VITALS — Resp 16 | Ht 64.0 in | Wt 170.0 lb

## 2019-07-06 DIAGNOSIS — J309 Allergic rhinitis, unspecified: Secondary | ICD-10-CM

## 2019-07-06 DIAGNOSIS — J0100 Acute maxillary sinusitis, unspecified: Secondary | ICD-10-CM

## 2019-07-06 MED ORDER — AMOXICILLIN-POT CLAVULANATE 875-125 MG PO TABS: 1.0000 | ORAL_TABLET | Freq: Two times a day (BID) | ORAL | 0 refills | Status: DC

## 2019-07-06 NOTE — Progress Notes (Signed)
[facility] [place], [place] [address]  Internal MEDICINE  Telephone Visit  Patient Name: Jamie Meadows  [id number]  [id number]  Date of Service: 07/06/2019  I connected with the patient at 934 by telephone and verified the patients identity using two identifiers.   I discussed the limitations, risks, security and privacy concerns of performing an evaluation and management service by telephone and the availability of in person appointments. I also discussed with the patient that there may be a patient responsible charge related to the service.  The patient expressed understanding and agrees to proceed.    Chief Complaint  Patient presents with  . Telephone Assessment  . Telephone Screen  . Sinusitis    sinus infection     HPI  Pt seen via video.  She reports sinus symptoms that began yesterday.  She has sinus pressure and drainage.  She has had some headache as well.  She denies any fever at this time.She does have a history of allergies.  She should be starting back her allergy shots soon, as the drops were not working for her.      Current Medication: Outpatient Encounter Medications as of 07/06/2019  Medication Sig  . amphetamine-dextroamphetamine (ADDERALL) 30 MG tablet Take 1 tablet by mouth 2 (two) times daily.  . fluticasone (FLONASE) 50 MCG/ACT nasal spray Place 2 sprays into both nostrils daily.  . hydrocortisone 1 % ointment Apply 1 application topically 2 (two) times daily.  Marland Kitchen levonorgestrel (MIRENA) 20 MCG/24HR IUD 1 each by Intrauterine route once.  . loratadine (CLARITIN) 10 MG tablet Take 1 tablet (10 mg total) by mouth daily.  . Multiple Vitamin (MULTIVITAMIN) tablet Take 1 tablet by mouth daily.  . valACYclovir (VALTREX) 1000 MG tablet Take 1 tablet (1,000 mg total) by mouth 2 (two) times daily.  Marland Kitchen amoxicillin-clavulanate (AUGMENTIN) 875-125 MG tablet Take 1 tablet by mouth 2 (two) times daily.   No facility-administered encounter  medications on file as of 07/06/2019.    Surgical History: Past Surgical History:  Procedure Laterality Date  . ABDOMINOPLASTY  2017  . COLPOSCOPY  09/21/2015  . WISDOM TOOTH EXTRACTION      Medical History: Past Medical History:  Diagnosis Date  . Allergic rhinitis   . Fracture of clavicle with routine healing right  . History of abnormal cervical Pap smear 2017   LGSIL with HRHPV  . Sinusitis   . Wrist fracture, closed    left    Family History: Family History  Problem Relation Age of Onset  . Diabetes Mother   . Kidney disease Mother   . Allergies Father   . Breast cancer Maternal Aunt 68  . Diabetes Maternal Grandfather   . Hypertension Maternal Grandfather   . Cancer Sister     Social History   Socioeconomic History  . Marital status: Legally Separated    Spouse name: Not on file  . Number of children: 4  . Years of education: 39  . Highest education level: Not on file  Occupational History  . 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 Smoking status: Former Research scientist (life sciences)  . Smokeless tobacco: Never Used  Substance and Sexual Activity  . Alcohol use: Yes    Alcohol/week: 7.0 standard drinks    Types: 7 Glasses of wine per week    Comment: occasionally  . Drug use: Never  . Sexual activity: Yes    Birth control/protection: I.U.D.  Other Topics Concern  . Not on file  Social History Narrative  . Not on file   Social Determinants of Health   Financial Resource Strain:   . Difficulty of Paying Living Expenses: Not on file  Food Insecurity:   . Worried About Programme researcher, broadcasting/film/video in the Last Year: Not on file  . Ran Out of Food in the Last Year: Not on file  Transportation Needs:   . Lack of Transportation (Medical): Not on file  . Lack of Transportation (Non-Medical): Not on file  Physical Activity:   . Days of Exercise per Week: Not on file  . Minutes of Exercise per Session: Not on file  Stress:   . Feeling of Stress : Not on file  Social  Connections:   . Frequency of Communication with Friends and Family: Not on file  . Frequency of Social Gatherings with Friends and Family: Not on file  . Attends Religious Services: Not on file  . Active Member of Clubs or Organizations: Not on file  . Attends Banker Meetings: Not on file  . Marital Status: Not on file  Intimate Partner Violence:   . Fear of Current or Ex-Partner: Not on file  . Emotionally Abused: Not on file  . Physically Abused: Not on file  . Sexually Abused: Not on file      Review of Systems  Constitutional: Negative for chills, fatigue and unexpected weight change.  HENT: Negative for congestion, rhinorrhea, sneezing and sore throat.   Eyes: Negative for photophobia, pain and redness.  Respiratory: Negative for cough, chest tightness and shortness of breath.   Cardiovascular: Negative for chest pain and palpitations.  Gastrointestinal: Negative for abdominal pain, constipation, diarrhea, nausea and vomiting.  Endocrine: Negative.   Genitourinary: Negative for dysuria and frequency.  Musculoskeletal: Negative for arthralgias, back pain, joint swelling and neck pain.  Skin: Negative for rash.  Allergic/Immunologic: Negative.   Neurological: Negative for tremors and numbness.  Hematological: Negative for adenopathy. Does not bruise/bleed easily.  Psychiatric/Behavioral: Negative for behavioral problems and sleep disturbance. The patient is not nervous/anxious.     Vital Signs: Resp 16   Ht 5\' 4"  (1.626 m)   Wt 170 lb (77.1 kg)   BMI 29.18 kg/m    Observation/Objective: Well appearing, NAD noted.    Assessment/Plan: 1. Subacute maxillary sinusitis Advised patient to take entire course of antibiotics as prescribed with food. Pt should return to clinic in 7-10 days if symptoms fail to improve or new symptoms develop.  - amoxicillin-clavulanate (AUGMENTIN) 875-125 MG tablet; Take 1 tablet by mouth 2 (two) times daily.  Dispense: 14  tablet; Refill: 0  2. Chronic allergic rhinitis Continue with allergy medications.   General Counseling: allia wiltsey understanding of the findings of today's phone visit and agrees with plan of treatment. I have discussed any further diagnostic evaluation that may be needed or ordered today. We also reviewed her medications today. she has been encouraged to call the office with any questions or concerns that should arise related to todays visit.    No orders of the defined types were placed in this encounter.   Meds ordered this encounter  Medications  . amoxicillin-clavulanate (AUGMENTIN) 875-125 MG tablet    Sig: Take 1 tablet by mouth 2 (two) times daily.    Dispense:  14 tablet    Refill:  0    Time spent: 20 Minutes    Berneta Levins AGNP-C Internal medicine

## 2019-07-28 ENCOUNTER — Other Ambulatory Visit: Payer: Self-pay | Admitting: Obstetrics & Gynecology

## 2019-07-29 ENCOUNTER — Telehealth: Payer: Self-pay

## 2019-07-29 NOTE — Telephone Encounter (Signed)
Pt aware to schedule her mammogram 

## 2019-07-29 NOTE — Telephone Encounter (Signed)
-----   Message from Nadara Mustard, MD sent at 07/28/2019  3:21 PM EST ----- Regarding: MMG Received notice she has not received MMG yet as ordered at her Annual. Please check and encourage her to do this, and document conversation.

## 2019-08-07 ENCOUNTER — Other Ambulatory Visit: Payer: Self-pay | Admitting: Adult Health

## 2019-08-07 DIAGNOSIS — B379 Candidiasis, unspecified: Secondary | ICD-10-CM

## 2019-09-17 ENCOUNTER — Telehealth: Payer: Self-pay

## 2019-09-17 NOTE — Telephone Encounter (Signed)
Confirmed and screened for 09-21-19 ov. 

## 2019-09-21 ENCOUNTER — Ambulatory Visit (INDEPENDENT_AMBULATORY_CARE_PROVIDER_SITE_OTHER): Payer: BLUE CROSS/BLUE SHIELD | Admitting: Nurse Practitioner

## 2019-09-21 ENCOUNTER — Other Ambulatory Visit: Payer: Self-pay

## 2019-09-21 ENCOUNTER — Encounter: Payer: Self-pay | Admitting: Nurse Practitioner

## 2019-09-21 VITALS — BP 123/78 | HR 96 | Temp 97.4°F | Resp 16 | Ht 64.0 in | Wt 185.4 lb

## 2019-09-21 DIAGNOSIS — J309 Allergic rhinitis, unspecified: Secondary | ICD-10-CM

## 2019-09-21 DIAGNOSIS — R41840 Attention and concentration deficit: Secondary | ICD-10-CM

## 2019-09-21 DIAGNOSIS — Z79899 Other long term (current) drug therapy: Secondary | ICD-10-CM | POA: Diagnosis not present

## 2019-09-21 LAB — POCT URINE DRUG SCREEN
POC Amphetamine UR: POSITIVE — AB
POC BENZODIAZEPINES UR: NOT DETECTED
POC Barbiturate UR: NOT DETECTED
POC Cocaine UR: NOT DETECTED
POC Ecstasy UR: NOT DETECTED
POC Marijuana UR: NOT DETECTED
POC Methadone UR: NOT DETECTED
POC Methamphetamine UR: NOT DETECTED
POC Opiate Ur: NOT DETECTED
POC Oxycodone UR: NOT DETECTED
POC PHENCYCLIDINE UR: NOT DETECTED
POC TRICYCLICS UR: NOT DETECTED

## 2019-09-21 MED ORDER — AMPHETAMINE-DEXTROAMPHETAMINE 30 MG PO TABS: 30.0000 mg | ORAL_TABLET | Freq: Two times a day (BID) | ORAL | 0 refills | Status: DC

## 2019-09-21 MED ORDER — CETIRIZINE HCL 10 MG PO TABS: 10.0000 mg | ORAL_TABLET | Freq: Every day | ORAL | 3 refills | Status: DC

## 2019-09-21 NOTE — Progress Notes (Signed)
[facility] [address] [place], [place] [address]  Internal MEDICINE  Office Visit Note  Patient Name: Jamie Meadows  [id number]  [id number]  Date of Service: 09/21/2019  Chief Complaint  Patient presents with  . ADD  . Allergic Rhinitis   . Medication Management    rx for allergy medication different than what she has been taking , would like zyrtec, took off brand claritin     The patient is here for follow up visit. She is concerned about weight gain. Has gained five pounds since her last, in-office visit. She states that she has good eating habits but does not exercise as much as she should. She is also c/o severe allergies. claritin not doing so well in helping control symptoms. Would like to try zyrtec. She is thinking about restarting allergy injections.  First mammogram is scheduled for June or July.   She is currently taking 30mg  of adderall BID. Doing better since dosing was increased to 30mg  twice daily. Taking only when needed. She denies negative side effects associated with the medication.         Current Medication: Outpatient Encounter Medications as of 09/21/2019  Medication Sig  . fluconazole (DIFLUCAN) 150 MG tablet TAKE 1 TABLET(150 MG) BY MOUTH EVERY 3 DAYS AS NEEDED  . fluticasone (FLONASE) 50 MCG/ACT nasal spray Place 2 sprays into both nostrils daily.  . hydrocortisone 1 % ointment Apply 1 application topically 2 (two) times daily.  levonorgestrel (MIRENA) 20 MCG/24HR IUD 1 each by Intrauterine route once.  . loratadine (CLARITIN) 10 MG tablet Take 1 tablet (10 mg total) by mouth daily.  . Multiple Vitamin (MULTIVITAMIN) tablet Take 1 tablet by mouth daily.  . valACYclovir (VALTREX) 1000 MG tablet Take 1 tablet (1,000 mg total) by mouth 2 (two) times daily.  . [DISCONTINUED] amphetamine-dextroamphetamine (ADDERALL) 30 MG tablet Take 1 tablet by mouth 2 (two) times daily.  . [DISCONTINUED] amphetamine-dextroamphetamine (ADDERALL) 30 MG  tablet Take 1 tablet by mouth 2 (two) times daily.  . [DISCONTINUED] amphetamine-dextroamphetamine (ADDERALL) 30 MG tablet Take 1 tablet by mouth 2 (two) times daily.  11/21/2019 amphetamine-dextroamphetamine (ADDERALL) 30 MG tablet Take 1 tablet by mouth 2 (two) times daily.  . cetirizine (ZYRTEC) 10 MG tablet Take 1 tablet (10 mg total) by mouth daily.  . [DISCONTINUED] amoxicillin-clavulanate (AUGMENTIN) 875-125 MG tablet Take 1 tablet by mouth 2 (two) times daily. (Patient not taking: Reported on 09/21/2019)   No facility-administered encounter medications on file as of 09/21/2019.    Surgical History: Past Surgical History:  Procedure Laterality Date  . ABDOMINOPLASTY  2017  . COLPOSCOPY  09/21/2015  . WISDOM TOOTH EXTRACTION      Medical History: Past Medical History:  Diagnosis Date  . Allergic rhinitis   . Fracture of clavicle with routine healing right  . History of abnormal cervical Pap smear 2017   LGSIL with HRHPV  . Sinusitis   . Wrist fracture, closed    left    Family History: Family History  Problem Relation Age of Onset  . Diabetes Mother   . Kidney disease Mother   . Allergies Father   . Breast cancer Maternal Aunt 50  . Diabetes Maternal Grandfather   . Hypertension Maternal Grandfather   . Cancer Sister     Social History   Socioeconomic History  . Marital status: Legally Separated    Spouse name: Not on file  . Number of children: 4  . Years of education: 62  .  Highest education level: Not on file  Occupational History  . 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 Smoking status: Former Games developer  . Smokeless tobacco: Never Used  Substance and Sexual Activity  . Alcohol use: Yes    Alcohol/week: 7.0 standard drinks    Types: 7 Glasses of wine per week    Comment: occasionally  . Drug use: Never  . Sexual activity: Yes    Birth control/protection: I.U.D.  Other Topics Concern  . Not on file  Social History Narrative  . Not on file    Social Determinants of Health   Financial Resource Strain:   . Difficulty of Paying Living Expenses:   Food Insecurity:   . Worried About Programme researcher, broadcasting/film/video in the Last Year:   . Barista in the Last Year:   Transportation Needs:   . Freight forwarder (Medical):   Marland Kitchen Lack of Transportation (Non-Medical):   Physical Activity:   . Days of Exercise per Week:   . Minutes of Exercise per Session:   Stress:   . Feeling of Stress :   Social Connections:   . Frequency of Communication with Friends and Family:   . Frequency of Social Gatherings with Friends and Family:   . Attends Religious Services:   . Active Member of Clubs or Organizations:   . Attends Banker Meetings:   Marland Kitchen Marital Status:   Intimate Partner Violence:   . Fear of Current or Ex-Partner:   . Emotionally Abused:   Marland Kitchen Physically Abused:   . Sexually Abused:       Review of Systems  Constitutional: Negative for activity change, chills, fatigue and unexpected weight change.       Five pound weight gain since her last visit   HENT: Positive for congestion and postnasal drip. Negative for rhinorrhea, sneezing and sore throat.   Respiratory: Negative for cough, chest tightness, shortness of breath and wheezing.   Cardiovascular: Negative for chest pain and palpitations.  Gastrointestinal: Negative for abdominal pain, constipation, diarrhea, nausea and vomiting.  Endocrine: Negative for cold intolerance, heat intolerance and polydipsia.  Musculoskeletal: Negative for arthralgias, back pain, joint swelling and neck pain.  Skin: Negative for rash.  Allergic/Immunologic: Positive for environmental allergies.  Neurological: Negative for dizziness, tremors, numbness and headaches.  Hematological: Negative for adenopathy. Does not bruise/bleed easily.  Psychiatric/Behavioral: Positive for decreased concentration. Negative for behavioral problems (Depression), sleep disturbance and suicidal ideas.  The patient is not nervous/anxious.     Today's Vitals   09/21/19 0923  BP: 123/78  Pulse: 96  Resp: 16  Temp: (!) 97.4 F (36.3 C)  SpO2: 100%  Weight: 185 lb 6.4 oz (84.1 kg)  Height: 5\' 4"  (1.626 m)   Body mass index is 31.82 kg/m.  Physical Exam Vitals and nursing note reviewed.  Constitutional:      General: She is not in acute distress.    Appearance: Normal appearance. She is well-developed. She is not diaphoretic.  HENT:     Head: Normocephalic and atraumatic.     Mouth/Throat:     Pharynx: No oropharyngeal exudate.  Eyes:     Pupils: Pupils are equal, round, and reactive to light.  Neck:     Thyroid: No thyromegaly.     Vascular: No JVD.     Trachea: No tracheal deviation.  Cardiovascular:     Rate and Rhythm: Normal rate and regular rhythm.     Heart sounds: Normal heart  sounds. No murmur. No friction rub. No gallop.   Pulmonary:     Effort: Pulmonary effort is normal. No respiratory distress.     Breath sounds: Normal breath sounds. No wheezing or rales.  Chest:     Chest wall: No tenderness.  Abdominal:     Palpations: Abdomen is soft.  Musculoskeletal:        General: Normal range of motion.     Cervical back: Normal range of motion and neck supple.  Lymphadenopathy:     Cervical: No cervical adenopathy.  Skin:    General: Skin is warm and dry.  Neurological:     Mental Status: She is alert and oriented to person, place, and time.     Cranial Nerves: No cranial nerve deficit.  Psychiatric:        Mood and Affect: Mood normal.        Behavior: Behavior normal.        Thought Content: Thought content normal.        Judgment: Judgment normal.    Assessment/Plan: 1. Chronic allergic rhinitis Change loratidine to cetirizine 10mg  daily. Consider restarting allergy injections. Patient to let me know when/if she wants to get new referral for allergy shots.  - cetirizine (ZYRTEC) 10 MG tablet; Take 1 tablet (10 mg total) by mouth daily.  Dispense: 90  tablet; Refill: 3  2. Attention and concentration deficit May continue to take adderall 30mg  twice daily as needed for focus/concentration. Three 30 day prescriptions sent to her pharmacy. Dates are 09/21/2019, 10/20/2019, and 11/18/2019 - amphetamine-dextroamphetamine (ADDERALL) 30 MG tablet; Take 1 tablet by mouth 2 (two) times daily.  Dispense: 60 tablet; Refill: 0  3. Encounter for long-term (current) use of medications - POCT Urine Drug Screen appropriately positive for AMP only.   General Counseling: rane dumm understanding of the findings of todays visit and agrees with plan of treatment. I have discussed any further diagnostic evaluation that may be needed or ordered today. We also reviewed her medications today. she has been encouraged to call the office with any questions or concerns that should arise related to todays visit.  Refilled Controlled medications today. Reviewed risks and possible side effects associated with taking Stimulants. Combination of these drugs with other psychotropic medications could cause dizziness and drowsiness. Pt needs to Monitor symptoms and exercise caution in driving and operating heavy machinery to avoid damages to oneself, to others and to the surroundings. Patient verbalized understanding in this matter. Dependence and abuse for these drugs will be monitored closely. A Controlled substance policy and procedure is on file which allows [facility] to order a urine drug screen test at any visit. Patient understands and agrees with the plan..  This patient was seen by Leretha Pol FNP Collaboration with Dr Lavera Guise as a part of collaborative care agreement  Orders Placed This Encounter  Procedures  . POCT Urine Drug Screen    Meds ordered this encounter  Medications  . DISCONTD: amphetamine-dextroamphetamine (ADDERALL) 30 MG tablet    Sig: Take 1 tablet by mouth 2 (two) times daily.    Dispense:  60 tablet    Refill:  0     Order Specific Question:   Supervising Provider    Answer:   Lavera Guise [[id number]]  . cetirizine (ZYRTEC) 10 MG tablet    Sig: Take 1 tablet (10 mg total) by mouth daily.    Dispense:  90 tablet    Refill:  3  Please d/c loratidine    Order Specific Question:   Supervising Provider    Answer:   Lyndon Code [[id number]]  . DISCONTD: amphetamine-dextroamphetamine (ADDERALL) 30 MG tablet    Sig: Take 1 tablet by mouth 2 (two) times daily.    Dispense:  60 tablet    Refill:  0    Please fill after 10/20/2019    Order Specific Question:   Supervising Provider    Answer:   Lyndon Code [[id number]]  . amphetamine-dextroamphetamine (ADDERALL) 30 MG tablet    Sig: Take 1 tablet by mouth 2 (two) times daily.    Dispense:  60 tablet    Refill:  0    Please fill after 11/18/2019    Order Specific Question:   Supervising Provider    Answer:   Lyndon Code [[id number]]    Total time spent: 30 Minutes   Time spent includes review of chart, medications, test results, and follow up plan with the patient.      Dr Lyndon Code Internal medicine

## 2019-10-03 LAB — HM PAP SMEAR: HM Pap smear: NEGATIVE

## 2019-10-26 ENCOUNTER — Ambulatory Visit
Admission: RE | Admit: 2019-10-26 | Discharge: 2019-10-26 | Disposition: A | Discharge status: Home / Self Care | Payer: BLUE CROSS/BLUE SHIELD | Source: Ambulatory Visit | Attending: Obstetrics & Gynecology | Admitting: Obstetrics & Gynecology

## 2019-10-26 DIAGNOSIS — Z1231 Encounter for screening mammogram for malignant neoplasm of breast: Secondary | ICD-10-CM | POA: Diagnosis present

## 2019-11-03 ENCOUNTER — Other Ambulatory Visit (HOSPITAL_COMMUNITY)
Admission: RE | Admit: 2019-11-03 | Discharge: 2019-11-03 | Disposition: A | Discharge status: Home / Self Care | Payer: BLUE CROSS/BLUE SHIELD | Source: Ambulatory Visit | Attending: Obstetrics & Gynecology | Admitting: Obstetrics & Gynecology

## 2019-11-03 ENCOUNTER — Ambulatory Visit (INDEPENDENT_AMBULATORY_CARE_PROVIDER_SITE_OTHER): Payer: BLUE CROSS/BLUE SHIELD | Admitting: Obstetrics & Gynecology

## 2019-11-03 ENCOUNTER — Encounter: Payer: Self-pay | Admitting: Obstetrics & Gynecology

## 2019-11-03 ENCOUNTER — Other Ambulatory Visit: Payer: Self-pay

## 2019-11-03 VITALS — BP 120/80 | Ht 65.0 in | Wt 186.0 lb

## 2019-11-03 DIAGNOSIS — Z87410 Personal history of cervical dysplasia: Secondary | ICD-10-CM

## 2019-11-03 NOTE — Progress Notes (Signed)
HPI:  Patient is a 44 y.o. [id number] presenting for follow up evaluation of abnormal PAP smear in the past.  Her last PAP was 6 months ago and was normal and prior PAP was ASCUS. She has had a prior colposcopy. Prior biopsies (if done) were Normal.  IUD doing well. Since 08/2017.  PMHx: She  has a past medical history of Allergic rhinitis, Fracture of clavicle with routine healing (right), History of abnormal cervical Pap smear (2017), Sinusitis, and Wrist fracture, closed. Also,  has a past surgical history that includes Abdominoplasty (2017); Wisdom tooth extraction; and Colposcopy (09/21/2015)., family history includes Allergies in her father; Breast cancer (age of onset: 65) in her maternal aunt; Cancer in her sister; Diabetes in her maternal grandfather and mother; Hypertension in her maternal grandfather; Kidney disease in her mother.,  reports that she has quit smoking. She has never used smokeless tobacco. She reports current alcohol use of about 7.0 standard drinks of alcohol per week. She reports that she does not use drugs.  She has a current medication list which includes the following prescription(s): amphetamine-dextroamphetamine, cetirizine, fluconazole, fluticasone, hydrocortisone, levonorgestrel, loratadine, multivitamin, and valacyclovir. Also, has No Known Allergies.  Review of Systems  All other systems reviewed and are negative.   Objective: BP 120/80   Ht 5\' 5"  (1.651 m)   Wt 186 lb (84.4 kg)   BMI 30.95 kg/m  Filed Weights   11/03/19 0954  Weight: 186 lb (84.4 kg)   Body mass index is 30.95 kg/m.  Physical examination Physical Exam Constitutional:      General: She is not in acute distress.    Appearance: She is well-developed.  Genitourinary:     Pelvic exam was performed with patient supine.     Vagina and uterus normal.     No vaginal erythema or bleeding.     No cervical motion tenderness, discharge, polyp or nabothian cyst.     IUD strings visualized.      Uterus is mobile.     Uterus is not enlarged.     No uterine mass detected.    Uterus is midaxial.     No right or left adnexal mass present.     Right adnexa not tender.     Left adnexa not tender.     Genitourinary Comments: Strings 1/2 cm at os  HENT:     Head: Normocephalic and atraumatic.     Nose: Nose normal.  Abdominal:     General: There is no distension.     Palpations: Abdomen is soft.     Tenderness: There is no abdominal tenderness.  Musculoskeletal:        General: Normal range of motion.  Neurological:     Mental Status: She is alert and oriented to person, place, and time.     Cranial Nerves: No cranial nerve deficit.  Skin:    General: Skin is warm and dry.  Psychiatric:        Attention and Perception: Attention normal.        Mood and Affect: Mood and affect normal.        Speech: Speech normal.        Behavior: Behavior normal.        Thought Content: Thought content normal.        Judgment: Judgment normal.     ASSESSMENT:  History of Cervical Dysplasia  Plan:  1.  I discussed the grading system of pap smears and HPV high risk viral types.  2. Follow up PAP 12 months if normal, vs intervention if high grade dysplasia identified.  PAP 6 mos if LGSIL or ASCUS. 3. Treatment of persistantly abnormal PAP smears and cervical dysplasia, even mild, is discussed w pt today in detail, as well as the pros and cons of Cryo and LEEP procedures. Will consider and discuss after results.  A total of 20 minutes were spent face-to-face with the patient as well as preparation, review, communication, and documentation during this encounter.    Barnett Applebaum, MD, [facility], [facility] 11/03/2019  10:09 AM

## 2019-11-03 NOTE — Patient Instructions (Signed)
Thank you for choosing [facility]. As part of our ongoing efforts to improve patient experience, we would appreciate your feedback. Please fill out the short survey that you will receive by mail or MyChart. Your opinion is important to us!  

## 2019-11-04 LAB — CYTOLOGY - PAP: Diagnosis: NEGATIVE

## 2019-12-18 ENCOUNTER — Telehealth: Payer: Self-pay

## 2019-12-18 NOTE — Telephone Encounter (Signed)
Confirmed and screened for 12-22-19 ov. 

## 2019-12-22 ENCOUNTER — Ambulatory Visit (INDEPENDENT_AMBULATORY_CARE_PROVIDER_SITE_OTHER): Payer: 59 | Admitting: Hospice and Palliative Medicine

## 2019-12-22 ENCOUNTER — Encounter: Payer: Self-pay | Admitting: Nurse Practitioner

## 2019-12-22 ENCOUNTER — Other Ambulatory Visit: Payer: Self-pay

## 2019-12-22 DIAGNOSIS — R41840 Attention and concentration deficit: Secondary | ICD-10-CM

## 2019-12-22 DIAGNOSIS — J301 Allergic rhinitis due to pollen: Secondary | ICD-10-CM

## 2019-12-22 DIAGNOSIS — Z79899 Other long term (current) drug therapy: Secondary | ICD-10-CM | POA: Diagnosis not present

## 2019-12-22 LAB — POCT URINE DRUG SCREEN
Methylenedioxyamphetamine: NOT DETECTED
POC Amphetamine UR: POSITIVE — AB
POC BENZODIAZEPINES UR: NOT DETECTED
POC Barbiturate UR: NOT DETECTED
POC Cocaine UR: NOT DETECTED
POC Ecstasy UR: NOT DETECTED
POC Marijuana UR: NOT DETECTED
POC Methadone UR: NOT DETECTED
POC Methamphetamine UR: NOT DETECTED
POC Opiate Ur: NOT DETECTED
POC Oxycodone UR: NOT DETECTED
POC PHENCYCLIDINE UR: NOT DETECTED
POC TRICYCLICS UR: NOT DETECTED

## 2019-12-22 MED ORDER — AMPHETAMINE-DEXTROAMPHETAMINE 30 MG PO TABS: 30.0000 mg | ORAL_TABLET | Freq: Two times a day (BID) | ORAL | 0 refills | Status: DC

## 2019-12-22 NOTE — Progress Notes (Signed)
[facility] [address] [place], [place] [address]  Internal MEDICINE  Office Visit Note  Patient Name: Jamie Meadows  [id number]  [id number]  Date of Service: 12/26/2019  Chief Complaint  Patient presents with  . Follow-up    Adderall refills (did drug screen)  . Quality Metric Gaps    TDAP    HPI  Here today for routine follow-up on chronic medical conditions. Requesting refill on her Adderall for attention and concentration during the day. Takes Adderall as needed. Not reporting any side effects from medications. Feels as though allergies are better controlled at this time, still thinking about restarting allergy injections. BP stable today.  Current Medication: Outpatient Encounter Medications as of 12/22/2019  Medication Sig  . amphetamine-dextroamphetamine (ADDERALL) 30 MG tablet Take 1 tablet by mouth 2 (two) times daily.  . cetirizine (ZYRTEC) 10 MG tablet Take 1 tablet (10 mg total) by mouth daily.  . fluconazole (DIFLUCAN) 150 MG tablet TAKE 1 TABLET(150 MG) BY MOUTH EVERY 3 DAYS AS NEEDED  . fluticasone (FLONASE) 50 MCG/ACT nasal spray Place 2 sprays into both nostrils daily.  Marland Kitchen levonorgestrel (MIRENA) 20 MCG/24HR IUD 1 each by Intrauterine route once.  . loratadine (CLARITIN) 10 MG tablet Take 1 tablet (10 mg total) by mouth daily.  . Multiple Vitamin (MULTIVITAMIN) tablet Take 1 tablet by mouth daily.  . [DISCONTINUED] amphetamine-dextroamphetamine (ADDERALL) 30 MG tablet Take 1 tablet by mouth 2 (two) times daily.  . [DISCONTINUED] hydrocortisone 1 % ointment Apply 1 application topically 2 (two) times daily.  . [DISCONTINUED] valACYclovir (VALTREX) 1000 MG tablet Take 1 tablet (1,000 mg total) by mouth 2 (two) times daily.  Marland Kitchen amphetamine-dextroamphetamine (ADDERALL) 30 MG tablet Take 1 tablet by mouth 2 (two) times daily.  Marland Kitchen amphetamine-dextroamphetamine (ADDERALL) 30 MG tablet Take 1 tablet by mouth 2 (two) times daily.   No facility-administered  encounter medications on file as of 12/22/2019.    Surgical History: Past Surgical History:  Procedure Laterality Date  . ABDOMINOPLASTY  2017  . COLPOSCOPY  09/21/2015  . WISDOM TOOTH EXTRACTION      Medical History: Past Medical History:  Diagnosis Date  . Allergic rhinitis   . Fracture of clavicle with routine healing right  . History of abnormal cervical Pap smear 2017   LGSIL with HRHPV  . Sinusitis   . Wrist fracture, closed    left    Family History: Family History  Problem Relation Age of Onset  . Diabetes Mother   . Kidney disease Mother   . Allergies Father   . Breast cancer Maternal Aunt 50  . Diabetes Maternal Grandfather   . Hypertension Maternal Grandfather   . Cancer Sister     Social History   Socioeconomic History  . Marital status: Divorced    Spouse name: Not on file  . Number of children: 4  . Years of education: 31  . Highest education level: Not on file  Occupational History  . 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 Smoking status: Former Games developer  . Smokeless tobacco: Never Used  Vaping Use  . Vaping Use: Never used  Substance and Sexual Activity  . Alcohol use: Yes    Alcohol/week: 7.0 standard drinks    Types: 7 Glasses of wine per week    Comment: occasionally  . Drug use: Never  . Sexual activity: Yes    Birth control/protection: I.U.D.  Other Topics Concern  . Not on file  Social History Narrative  .  Not on file   Social Determinants of Health   Financial Resource Strain:   . Difficulty of Paying Living Expenses:   Food Insecurity:   . Worried About Programme researcher, broadcasting/film/video in the Last Year:   . Barista in the Last Year:   Transportation Needs:   . Freight forwarder (Medical):   Marland Kitchen Lack of Transportation (Non-Medical):   Physical Activity:   . Days of Exercise per Week:   . Minutes of Exercise per Session:   Stress:   . Feeling of Stress :   Social Connections:   . Frequency of Communication with  Friends and Family:   . Frequency of Social Gatherings with Friends and Family:   . Attends Religious Services:   . Active Member of Clubs or Organizations:   . Attends Banker Meetings:   Marland Kitchen Marital Status:   Intimate Partner Violence:   . Fear of Current or Ex-Partner:   . Emotionally Abused:   Marland Kitchen Physically Abused:   . Sexually Abused:       Review of Systems  Constitutional: Negative.        For chills, fever, fatigue.  HENT: Negative.        For sinus pain, sinus pressure, sore throat, trouble swallowing.  Eyes: Negative.        For changes in vision or visual disturbances.  Respiratory: Negative.        For chest tightness, cough, shortness of breath, wheezing.  Cardiovascular: Negative.        For chest pain, ankle swelling, palpitations.  Gastrointestinal: Negative.        For abdominal pain, constipation, nausea, vomiting, diarrhea.  Endocrine: Negative.        For polydipsia, polyphagia, polyuria.  Genitourinary: Negative.        For dysuria, flank pain, hematuria, increased frequency, urgency.  Musculoskeletal: Negative.        For arthralgias, myalgias, back pain, neck pain, gait disturbances.  Skin:       For rash, wound.  Allergic/Immunologic: Negative.   Neurological: Negative.        For dizziness, headaches, tremors, weakness.  Hematological: Negative.   Psychiatric/Behavioral: Negative.        For confusion, depression, anxiety, sleep disturbances.   Vital Signs: BP 129/87   Pulse 98   Temp (!) 97.4 F (36.3 C)   Resp 16   Ht 5\' 5"  (1.651 m)   Wt 180 lb 12.8 oz (82 kg)   SpO2 99%   BMI 30.09 kg/m   Physical Exam Constitutional:      Appearance: Normal appearance. She is normal weight.  HENT:     Nose: Nose normal.     Mouth/Throat:     Mouth: Mucous membranes are moist.     Pharynx: Oropharynx is clear.  Cardiovascular:     Rate and Rhythm: Normal rate and regular rhythm.     Pulses: Normal pulses.     Heart sounds: Normal  heart sounds.  Pulmonary:     Effort: Pulmonary effort is normal.     Breath sounds: Normal breath sounds.  Abdominal:     General: Abdomen is flat. Bowel sounds are normal.     Palpations: Abdomen is soft.  Musculoskeletal:        General: Normal range of motion.     Cervical back: Normal range of motion.  Skin:    General: Skin is warm.  Neurological:     General: No  focal deficit present.     Mental Status: She is alert and oriented to person, place, and time. Mental status is at baseline.  Psychiatric:        Mood and Affect: Mood normal.        Behavior: Behavior normal.        Thought Content: Thought content normal.    Assessment/Plan: 1. Encounter for long-term (current) use of medications - POCT Urine Drug Screen   2. Attention and concentration deficit Continue taking Adderall as needed for attention and concentration deficit. Three month prescriptions sent to pharmacy, only able to fill each 30 days apart. Continue to monitor for effectiveness. - amphetamine-dextroamphetamine (ADDERALL) 30 MG tablet; Take 1 tablet by mouth 2 (two) times daily.  Dispense: 60 tablet; Refill: 0 - amphetamine-dextroamphetamine (ADDERALL) 30 MG tablet; Take 1 tablet by mouth 2 (two) times daily.  Dispense: 60 tablet; Refill: 0 - amphetamine-dextroamphetamine (ADDERALL) 30 MG tablet; Take 1 tablet by mouth 2 (two) times daily.  Dispense: 60 tablet; Refill: 0  3. Seasonal allergic rhinitis due to pollen Stable at this time, will continue to think about restarting allergy injections. Continue to monitor.  General Counseling: karilynn carranza understanding of the findings of todays visit and agrees with plan of treatment. I have discussed any further diagnostic evaluation that may be needed or ordered today. We also reviewed her medications today. she has been encouraged to call the office with any questions or concerns that should arise related to todays visit.  Refilled controlled  medications today. Reviewed risks and possible side effects associated with taking stimulants. Combination of these drugs with other psychotropic medications could cause dizziness and drowsiness. Patient needs to monitor symptoms and exercise caution in driving and operating heavy machinery to avoid damages to oneself, to others and to her surroundings. Patient verbalized understanding in this matter. Dependence and abuse for these drugs will be monitored closely. A controlled substance policy and procedure is on file which allows [facility] to order a urine drug screen test at any visit. Patient understands and agrees with the plan.  Orders Placed This Encounter  Procedures  . POCT Urine Drug Screen    Meds ordered this encounter  Medications  . amphetamine-dextroamphetamine (ADDERALL) 30 MG tablet    Sig: Take 1 tablet by mouth 2 (two) times daily.    Dispense:  60 tablet    Refill:  0  . amphetamine-dextroamphetamine (ADDERALL) 30 MG tablet    Sig: Take 1 tablet by mouth 2 (two) times daily.    Dispense:  60 tablet    Refill:  0    To be refilled after 10/2  . amphetamine-dextroamphetamine (ADDERALL) 30 MG tablet    Sig: Take 1 tablet by mouth 2 (two) times daily.    Dispense:  60 tablet    Refill:  0    Refill after 01/21/2020    Total time spent: 20 Minutes  This patient was seen by Leeanne Deed, AGNP-C in collaboration with Dr. Lyndon Code as part of collaborative care agreement.  Time spent includes review of chart, medications, test results, and follow up plan with the patient.   Leeanne Deed, AGNP-C  Dr Lyndon Code Internal medicine

## 2020-01-20 ENCOUNTER — Ambulatory Visit: Payer: 59 | Admitting: Hospice and Palliative Medicine

## 2020-01-20 DIAGNOSIS — B9689 Other specified bacterial agents as the cause of diseases classified elsewhere: Secondary | ICD-10-CM

## 2020-01-20 DIAGNOSIS — J309 Allergic rhinitis, unspecified: Secondary | ICD-10-CM

## 2020-01-20 DIAGNOSIS — J028 Acute pharyngitis due to other specified organisms: Secondary | ICD-10-CM | POA: Diagnosis not present

## 2020-01-20 MED ORDER — AMOXICILLIN 500 MG PO CAPS: 500.0000 mg | ORAL_CAPSULE | Freq: Two times a day (BID) | ORAL | 0 refills | Status: DC

## 2020-01-20 NOTE — Progress Notes (Signed)
[facility] [address] [place], [place] [address]  Internal MEDICINE  Telephone Visit  Patient Name: Jamie Meadows  [id number]  [id number]  Date of Service: 01/21/2020  I connected with the patient at 1145  by telephone and verified the patients identity using two identifiers.   I discussed the limitations, risks, security and privacy concerns of performing an evaluation and management service by telephone and the availability of in person appointments. I also discussed with the patient that there may be a patient responsible charge related to the service.  The patient expressed understanding and agrees to proceed.    Chief Complaint  Patient presents with  . Follow-up    daughter tested positive for strep yesterday, pts throat hurts and is red  . Telephone Assessment    [phone]   . Telephone Screen    pt is fine with a video call or phone call    HPI Patient is being seen today for a sick visit Her daughter was tested came back positive for strep throat earlier in the week Starting yesterday she began having a sore throat, loss of voice and body aches as well as headaches Denies fevers or congestion or shortness of breath.   Current Medication: Outpatient Encounter Medications as of 01/20/2020  Medication Sig  . amphetamine-dextroamphetamine (ADDERALL) 30 MG tablet Take 1 tablet by mouth 2 (two) times daily.  Marland Kitchen amphetamine-dextroamphetamine (ADDERALL) 30 MG tablet Take 1 tablet by mouth 2 (two) times daily.  Marland Kitchen amphetamine-dextroamphetamine (ADDERALL) 30 MG tablet Take 1 tablet by mouth 2 (two) times daily.  . cetirizine (ZYRTEC) 10 MG tablet Take 1 tablet (10 mg total) by mouth daily.  . fluticasone (FLONASE) 50 MCG/ACT nasal spray Place 2 sprays into both nostrils daily.  Marland Kitchen levonorgestrel (MIRENA) 20 MCG/24HR IUD 1 each by Intrauterine route once.  . loratadine (CLARITIN) 10 MG tablet Take 1 tablet (10 mg total) by mouth daily.  . Multiple Vitamin  (MULTIVITAMIN) tablet Take 1 tablet by mouth daily.  Marland Kitchen amoxicillin (AMOXIL) 500 MG capsule Take 1 capsule (500 mg total) by mouth 2 (two) times daily.  . [DISCONTINUED] fluconazole (DIFLUCAN) 150 MG tablet TAKE 1 TABLET(150 MG) BY MOUTH EVERY 3 DAYS AS NEEDED (Patient not taking: Reported on 01/20/2020)   No facility-administered encounter medications on file as of 01/20/2020.    Surgical History: Past Surgical History:  Procedure Laterality Date  . ABDOMINOPLASTY  2017  . COLPOSCOPY  09/21/2015  . WISDOM TOOTH EXTRACTION      Medical History: Past Medical History:  Diagnosis Date  . Allergic rhinitis   . Fracture of clavicle with routine healing right  . History of abnormal cervical Pap smear 2017   LGSIL with HRHPV  . Sinusitis   . Wrist fracture, closed    left    Family History: Family History  Problem Relation Age of Onset  . Diabetes Mother   . Kidney disease Mother   . Allergies Father   . Breast cancer Maternal Aunt 50  . Diabetes Maternal Grandfather   . Hypertension Maternal Grandfather   . Cancer Sister     Social History   Socioeconomic History  . Marital status: Divorced    Spouse name: Not on file  . Number of children: 4  . Years of education: 76  . Highest education level: Not on file  Occupational History  . 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 Smoking status: Former Games developer  . Smokeless tobacco: Never Used  Vaping Use  . Vaping Use: Never used  Substance and Sexual Activity  . Alcohol use: Yes    Alcohol/week: 7.0 standard drinks    Types: 7 Glasses of wine per week    Comment: occasionally  . Drug use: Never  . Sexual activity: Yes    Birth control/protection: I.U.D.  Other Topics Concern  . Not on file  Social History Narrative  . Not on file   Social Determinants of Health   Financial Resource Strain:   . Difficulty of Paying Living Expenses: Not on file  Food Insecurity:   . Worried About Programme researcher, broadcasting/film/video in the  Last Year: Not on file  . Ran Out of Food in the Last Year: Not on file  Transportation Needs:   . Lack of Transportation (Medical): Not on file  . Lack of Transportation (Non-Medical): Not on file  Physical Activity:   . Days of Exercise per Week: Not on file  . Minutes of Exercise per Session: Not on file  Stress:   . Feeling of Stress : Not on file  Social Connections:   . Frequency of Communication with Friends and Family: Not on file  . Frequency of Social Gatherings with Friends and Family: Not on file  . Attends Religious Services: Not on file  . Active Member of Clubs or Organizations: Not on file  . Attends Banker Meetings: Not on file  . Marital Status: Not on file  Intimate Partner Violence:   . Fear of Current or Ex-Partner: Not on file  . Emotionally Abused: Not on file  . Physically Abused: Not on file  . Sexually Abused: Not on file      Review of Systems  Constitutional: Positive for fatigue. Negative for chills and diaphoresis.  HENT: Positive for sore throat and voice change. Negative for congestion, ear pain, postnasal drip, sinus pressure and sinus pain.   Eyes: Negative for photophobia, discharge, redness, itching and visual disturbance.  Respiratory: Negative for cough, shortness of breath and wheezing.   Cardiovascular: Negative for chest pain, palpitations and leg swelling.  Gastrointestinal: Negative for abdominal pain, constipation, diarrhea, nausea and vomiting.  Genitourinary: Negative for dysuria and flank pain.  Musculoskeletal: Negative for arthralgias, back pain, gait problem and neck pain.  Skin: Negative for color change.  Allergic/Immunologic: Negative for environmental allergies and food allergies.  Neurological: Negative for dizziness and headaches.  Hematological: Does not bruise/bleed easily.  Psychiatric/Behavioral: Negative for agitation, behavioral problems (depression) and hallucinations.    Vital Signs: Temp 97.7 F  (36.5 C)   Resp 16   Ht 5\' 5"  (1.651 m)   Wt 171 lb (77.6 kg)   BMI 28.46 kg/m    Observation/Objective: No acute distress noted.  Assessment/Plan: 1. Acute bacterial pharyngitis Complete course of amoxicillin. Advised to contact office is symptoms worsen or have not improved within 10 days. - amoxicillin (AMOXIL) 500 MG capsule; Take 1 capsule (500 mg total) by mouth 2 (two) times daily.  Dispense: 20 capsule; Refill: 0  2. Chronic allergic rhinitis Symptoms are stable at this time, continue with current therapy.  General Counseling: derika eckles understanding of the findings of today's phone visit and agrees with plan of treatment. I have discussed any further diagnostic evaluation that may be needed or ordered today. We also reviewed her medications today. she has been encouraged to call the office with any questions or concerns that should arise related to todays visit.  Meds ordered this encounter  Medications  .  amoxicillin (AMOXIL) 500 MG capsule    Sig: Take 1 capsule (500 mg total) by mouth 2 (two) times daily.    Dispense:  20 capsule    Refill:  0     Time spent: 20 Minutes   Lubertha Basque. Caryl Manas AGNP-C Internal medicine

## 2020-01-21 ENCOUNTER — Encounter: Payer: Self-pay | Admitting: Hospice and Palliative Medicine

## 2020-01-26 ENCOUNTER — Telehealth: Payer: Self-pay

## 2020-01-26 NOTE — Telephone Encounter (Signed)
Patient advised health screen form up front for pick up. Jamie Meadows

## 2020-02-25 ENCOUNTER — Ambulatory Visit (INDEPENDENT_AMBULATORY_CARE_PROVIDER_SITE_OTHER): Payer: 59 | Admitting: Hospice and Palliative Medicine

## 2020-02-25 ENCOUNTER — Encounter: Payer: Self-pay | Admitting: Hospice and Palliative Medicine

## 2020-02-25 DIAGNOSIS — Z0001 Encounter for general adult medical examination with abnormal findings: Secondary | ICD-10-CM | POA: Diagnosis not present

## 2020-02-25 DIAGNOSIS — R41840 Attention and concentration deficit: Secondary | ICD-10-CM

## 2020-02-25 DIAGNOSIS — Z23 Encounter for immunization: Secondary | ICD-10-CM | POA: Diagnosis not present

## 2020-02-25 DIAGNOSIS — R300 Dysuria: Secondary | ICD-10-CM

## 2020-02-25 DIAGNOSIS — J301 Allergic rhinitis due to pollen: Secondary | ICD-10-CM

## 2020-02-25 DIAGNOSIS — E538 Deficiency of other specified B group vitamins: Secondary | ICD-10-CM | POA: Diagnosis not present

## 2020-02-25 MED ORDER — AMPHETAMINE-DEXTROAMPHETAMINE 20 MG PO TABS: 20.0000 mg | ORAL_TABLET | Freq: Two times a day (BID) | ORAL | 0 refills | Status: DC

## 2020-02-25 MED ORDER — CYANOCOBALAMIN 1000 MCG/ML IJ SOLN
1000.0000 ug | Freq: Once | INTRAMUSCULAR | Status: AC
  Administered 2020-02-25: 1000 ug via INTRAMUSCULAR

## 2020-02-25 NOTE — Progress Notes (Signed)
[facility] [address] [place], [place] [address]  Internal MEDICINE  Office Visit Note  Patient Name: Jamie Meadows  [id number]  [id number]  Date of Service: 02/25/2020  Chief Complaint  Patient presents with  . Annual Exam    pt wants b12  . Quality Metric Gaps    HepC, TDAP     HPI Pt is here for routine health maintenance examination She says overall she has been doing well Her allergies have been well controlled so far this season She talks about her children, constantly busy and feels tired or fatigued most days, she does sleep well, just feels she does not get enough sleep due to her busy schedule She would like to discuss B12 injections to see if this will help with her energy level  -We discussed in depth her Adderall prescription, we reviewed [facility] updated controlled substance policy We also discussed in depth the risks associated with long term use of stimulants--risk of LVH, HTN, sleep disturbance She says she has been wanting to wean herself off of Adderall but worries about her fatigue level--we will start working on weaning down  Up to date on mammogram--normal Has vaginal exams/PAP at GYN annually  Current Medication: Outpatient Encounter Medications as of 02/25/2020  Medication Sig  . cetirizine (ZYRTEC) 10 MG tablet Take 1 tablet (10 mg total) by mouth daily.  . fluticasone (FLONASE) 50 MCG/ACT nasal spray Place 2 sprays into both nostrils daily.  Marland Kitchen levonorgestrel (MIRENA) 20 MCG/24HR IUD 1 each by Intrauterine route once.  . loratadine (CLARITIN) 10 MG tablet Take 1 tablet (10 mg total) by mouth daily.  . Multiple Vitamin (MULTIVITAMIN) tablet Take 1 tablet by mouth daily.  . [DISCONTINUED] amphetamine-dextroamphetamine (ADDERALL) 30 MG tablet Take 1 tablet by mouth 2 (two) times daily.  . [DISCONTINUED] amphetamine-dextroamphetamine (ADDERALL) 30 MG tablet Take 1 tablet by mouth 2 (two) times daily.  . [DISCONTINUED]  amphetamine-dextroamphetamine (ADDERALL) 30 MG tablet Take 1 tablet by mouth 2 (two) times daily.  Marland Kitchen amphetamine-dextroamphetamine (ADDERALL) 20 MG tablet Take 1 tablet (20 mg total) by mouth 2 (two) times daily.  . [DISCONTINUED] amoxicillin (AMOXIL) 500 MG capsule Take 1 capsule (500 mg total) by mouth 2 (two) times daily. (Patient not taking: Reported on 02/25/2020)   No facility-administered encounter medications on file as of 02/25/2020.    Surgical History: Past Surgical History:  Procedure Laterality Date  . ABDOMINOPLASTY  2017  . COLPOSCOPY  09/21/2015  . WISDOM TOOTH EXTRACTION      Medical History: Past Medical History:  Diagnosis Date  . Allergic rhinitis   . Fracture of clavicle with routine healing right  . History of abnormal cervical Pap smear 2017   LGSIL with HRHPV  . Sinusitis   . Wrist fracture, closed    left    Family History: Family History  Problem Relation Age of Onset  . Diabetes Mother   . Kidney disease Mother   . Allergies Father   . Breast cancer Maternal Aunt 50  . Diabetes Maternal Grandfather   . Hypertension Maternal Grandfather   . Cancer Sister    Review of Systems  Constitutional: Positive for fatigue. Negative for chills and diaphoresis.  HENT: Negative for ear pain, postnasal drip and sinus pressure.   Eyes: Negative for photophobia, discharge, redness, itching and visual disturbance.  Respiratory: Negative for cough, shortness of breath and wheezing.   Cardiovascular: Negative for chest pain, palpitations and leg swelling.  Gastrointestinal: Negative for abdominal pain,  constipation, diarrhea, nausea and vomiting.  Genitourinary: Negative for dysuria and flank pain.  Musculoskeletal: Negative for arthralgias, back pain, gait problem and neck pain.  Skin: Negative for color change.  Allergic/Immunologic: Negative for environmental allergies and food allergies.  Neurological: Negative for dizziness and headaches.  Hematological:  Does not bruise/bleed easily.  Psychiatric/Behavioral: Negative for agitation, behavioral problems (depression) and hallucinations.     Vital Signs: BP 122/74   Pulse 80   Temp 97.6 F (36.4 C)   Resp 16   Ht 5\' 4"  (1.626 m)   Wt 169 lb (76.7 kg)   SpO2 98%   BMI 29.01 kg/m    Physical Exam Constitutional:      Appearance: Normal appearance. She is normal weight.  Cardiovascular:     Rate and Rhythm: Normal rate and regular rhythm.     Pulses: Normal pulses.     Heart sounds: Normal heart sounds.  Pulmonary:     Effort: Pulmonary effort is normal.     Breath sounds: Normal breath sounds.  Chest:     Breasts:        Right: Normal.        Left: Normal.  Abdominal:     General: Abdomen is flat.     Palpations: Abdomen is soft.  Musculoskeletal:        General: Normal range of motion.     Cervical back: Normal range of motion.  Skin:    General: Skin is warm.  Neurological:     General: No focal deficit present.     Mental Status: She is alert and oriented to person, place, and time. Mental status is at baseline.  Psychiatric:        Mood and Affect: Mood normal.        Behavior: Behavior normal.        Thought Content: Thought content normal.     LABS: Recent Results (from the past 2160 hour(s))  POCT Urine Drug Screen     Status: Abnormal   Collection Time: 12/22/19  9:46 AM  Result Value Ref Range   POC Methamphetamine UR None Detected None Detected   POC Opiate Ur None Detected None Detected   POC Barbiturate UR None Detected None Detected   POC Amphetamine UR Positive (A) None Detected   POC Oxycodone UR None Detected None Detected   POC Cocaine UR None Detected None Detected   POC Ecstasy UR None Detected None Detected   POC TRICYCLICS UR None Detected None Detected   POC PHENCYCLIDINE UR None Detected None Detected   POC Marijuana UR None Detected None Detected   POC Methadone UR None Detected None Detected   POC BENZODIAZEPINES UR None Detected  None Detected   URINE TEMPERATURE     POC DRUG SCREEN OXIDANTS URINE     POC SPECIFIC GRAVITY URINE     POC PH URINE     Methylenedioxyamphetamine None Detected None Detected   PDMP reviewed, Narcotic 0, Sedative 0, Stimulant 301, Overdose risk 0  Assessment/Plan: 1. Encounter for routine adult health examination with abnormal findings Well appearing 44 year old female, up to date on PHM Will review annual labs and adjust plan of care as indicated - CBC w/Diff/Platelet - Comprehensive Metabolic Panel (CMET) - Lipid Panel w/o Chol/HDL Ratio - TSH + free T4 - Vitamin D (25 hydroxy) - Vitamin B12 - Folate - Iron and TIBC - Ferritin  2. Attention and concentration deficit Will decrease dose to 20 mg BID, 30  day supply given, at follow-up will plan to further decrease her dose as tolerated - amphetamine-dextroamphetamine (ADDERALL) 20 MG tablet; Take 1 tablet (20 mg total) by mouth 2 (two) times daily.  Dispense: 60 tablet; Refill: 0  3. Seasonal allergic rhinitis due to pollen Symptoms remain stable at this time, if worsen may need to consider allergy testing  4. B12 deficiency Injection given today, will obtain updated labs and supplement as indicated - cyanocobalamin ((VITAMIN B-12)) injection 1,000 mcg  5. Flu vaccine need - Flu Vaccine MDCK QUAD PF  6. Dysuria - UA/M w/rflx Culture, Routine - Microscopic Examination  General Counseling: Danyla verbalizes understanding of the findings of todays visit and agrees with plan of treatment. I have discussed any further diagnostic evaluation that may be needed or ordered today. We also reviewed her medications today. she has been encouraged to call the office with any questions or concerns that should arise related to todays visit.    Counseling:    Orders Placed This Encounter  Procedures  . Flu Vaccine MDCK QUAD PF  . CBC w/Diff/Platelet  . Comprehensive Metabolic Panel (CMET)  . Lipid Panel w/o Chol/HDL Ratio  .  TSH + free T4  . Vitamin D (25 hydroxy)  . Vitamin B12  . Folate  . Iron and TIBC  . Ferritin    Meds ordered this encounter  Medications  . amphetamine-dextroamphetamine (ADDERALL) 20 MG tablet    Sig: Take 1 tablet (20 mg total) by mouth 2 (two) times daily.    Dispense:  60 tablet    Refill:  0    Total time spent: 35 Minutes  Time spent includes review of chart, medications, test results, and follow up plan with the patient.   This patient was seen by Brent General AGNP-C Collaboration with Dr Lyndon Code as a part of collaborative care agreement   Lubertha Basque. [facility]

## 2020-02-26 LAB — UA/M W/RFLX CULTURE, ROUTINE
Bilirubin, UA: NEGATIVE
Glucose, UA: NEGATIVE
Ketones, UA: NEGATIVE
Leukocytes,UA: NEGATIVE
Nitrite, UA: NEGATIVE
Protein,UA: NEGATIVE
RBC, UA: NEGATIVE
Specific Gravity, UA: 1.017 (ref 1.005–1.030)
Urobilinogen, Ur: 1 mg/dL (ref 0.2–1.0)
pH, UA: 8 — ABNORMAL HIGH (ref 5.0–7.5)

## 2020-02-26 LAB — MICROSCOPIC EXAMINATION
Casts: NONE SEEN /lpf
RBC, Urine: NONE SEEN /hpf (ref 0–2)
WBC, UA: NONE SEEN /hpf (ref 0–5)

## 2020-03-01 ENCOUNTER — Encounter: Payer: Self-pay | Admitting: Hospice and Palliative Medicine

## 2020-03-25 ENCOUNTER — Ambulatory Visit: Payer: 59 | Admitting: Nurse Practitioner

## 2020-04-04 ENCOUNTER — Ambulatory Visit (INDEPENDENT_AMBULATORY_CARE_PROVIDER_SITE_OTHER): Payer: 59 | Admitting: Internal Medicine

## 2020-04-04 ENCOUNTER — Other Ambulatory Visit: Payer: Self-pay

## 2020-04-04 ENCOUNTER — Encounter: Payer: Self-pay | Admitting: Hospice and Palliative Medicine

## 2020-04-04 DIAGNOSIS — J301 Allergic rhinitis due to pollen: Secondary | ICD-10-CM | POA: Diagnosis not present

## 2020-04-04 DIAGNOSIS — Z79899 Other long term (current) drug therapy: Secondary | ICD-10-CM

## 2020-04-04 DIAGNOSIS — R41840 Attention and concentration deficit: Secondary | ICD-10-CM

## 2020-04-04 LAB — POCT URINE DRUG SCREEN
Methylenedioxyamphetamine: NOT DETECTED
POC Amphetamine UR: POSITIVE — AB
POC BENZODIAZEPINES UR: NOT DETECTED
POC Barbiturate UR: NOT DETECTED
POC Cocaine UR: NOT DETECTED
POC Ecstasy UR: NOT DETECTED
POC Marijuana UR: NOT DETECTED
POC Methadone UR: NOT DETECTED
POC Methamphetamine UR: NOT DETECTED
POC Opiate Ur: NOT DETECTED
POC Oxycodone UR: NOT DETECTED
POC PHENCYCLIDINE UR: NOT DETECTED
POC TRICYCLICS UR: NOT DETECTED

## 2020-04-04 MED ORDER — BUPROPION HCL ER (XL) 150 MG PO TB24: 150.0000 mg | ORAL_TABLET | Freq: Every day | ORAL | 2 refills | Status: DC

## 2020-04-04 MED ORDER — AMPHETAMINE-DEXTROAMPHETAMINE 10 MG PO TABS: 10.0000 mg | ORAL_TABLET | Freq: Two times a day (BID) | ORAL | 0 refills | Status: DC

## 2020-04-04 MED ORDER — AMPHETAMINE-DEXTROAMPHET ER 20 MG PO CP24: 20.0000 mg | ORAL_CAPSULE | Freq: Every day | ORAL | 0 refills | Status: DC

## 2020-04-04 MED ORDER — AMPHETAMINE-DEXTROAMPHETAMINE 10 MG PO TABS: ORAL_TABLET | ORAL | 0 refills | Status: DC

## 2020-04-04 MED ORDER — AMPHETAMINE-DEXTROAMPHET ER 20 MG PO CP24: 20.0000 mg | ORAL_CAPSULE | ORAL | 0 refills | Status: DC

## 2020-04-04 NOTE — Progress Notes (Signed)
[facility] [address] [place], [place] [address]  Internal MEDICINE  Office Visit Note  Patient Name: Jamie Meadows  [id number]  [id number]  Date of Service: 04/04/2020  Chief Complaint  Patient presents with  . Follow-up    refill request   . policy update form    received    HPI     Current Medication: Outpatient Encounter Medications as of 04/04/2020  Medication Sig  . amphetamine-dextroamphetamine (ADDERALL XR) 20 MG 24 hr capsule Take 1 capsule (20 mg total) by mouth daily.  Marland Kitchen amphetamine-dextroamphetamine (ADDERALL XR) 20 MG 24 hr capsule Take 1 capsule (20 mg total) by mouth every morning.  Marland Kitchen amphetamine-dextroamphetamine (ADDERALL) 10 MG tablet Take one tab po bid  . amphetamine-dextroamphetamine (ADDERALL) 10 MG tablet Take 1 tablet (10 mg total) by mouth 2 (two) times daily.  Marland Kitchen buPROPion (WELLBUTRIN XL) 150 MG 24 hr tablet Take 1 tablet (150 mg total) by mouth daily.  . cetirizine (ZYRTEC) 10 MG tablet Take 1 tablet (10 mg total) by mouth daily.  . fluticasone (FLONASE) 50 MCG/ACT nasal spray Place 2 sprays into both nostrils daily.  Marland Kitchen levonorgestrel (MIRENA) 20 MCG/24HR IUD 1 each by Intrauterine route once.  . loratadine (CLARITIN) 10 MG tablet Take 1 tablet (10 mg total) by mouth daily.  . Multiple Vitamin (MULTIVITAMIN) tablet Take 1 tablet by mouth daily.  . [DISCONTINUED] amphetamine-dextroamphetamine (ADDERALL) 20 MG tablet Take 1 tablet (20 mg total) by mouth 2 (two) times daily.   No facility-administered encounter medications on file as of 04/04/2020.    Surgical History: Past Surgical History:  Procedure Laterality Date  . ABDOMINOPLASTY  2017  . COLPOSCOPY  09/21/2015  . WISDOM TOOTH EXTRACTION      Medical History: Past Medical History:  Diagnosis Date  . Allergic rhinitis   . Fracture of clavicle with routine healing right  . History of abnormal cervical Pap smear 2017   LGSIL with HRHPV  . Sinusitis   . Wrist  fracture, closed    left    Family History: Family History  Problem Relation Age of Onset  . Diabetes Mother   . Kidney disease Mother   . Allergies Father   . Breast cancer Maternal Aunt 50  . Diabetes Maternal Grandfather   . Hypertension Maternal Grandfather   . Cancer Sister     Social History   Socioeconomic History  . Marital status: Divorced    Spouse name: Not on file  . Number of children: 4  . Years of education: 35  . Highest education level: Not on file  Occupational History  . 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 Smoking status: Former Games developer  . Smokeless tobacco: Never Used  Vaping Use  . Vaping Use: Never used  Substance and Sexual Activity  . Alcohol use: Yes    Alcohol/week: 7.0 standard drinks    Types: 7 Glasses of wine per week    Comment: occasionally  . Drug use: Never  . Sexual activity: Yes    Birth control/protection: I.U.D.  Other Topics Concern  . Not on file  Social History Narrative  . Not on file   Social Determinants of Health   Financial Resource Strain:   . Difficulty of Paying Living Expenses: Not on file  Food Insecurity:   . Worried About Programme researcher, broadcasting/film/video in the Last Year: Not on file  . Ran Out of Food in the Last Year: Not on file  Transportation  Needs:   . Lack of Transportation (Medical): Not on file  . Lack of Transportation (Non-Medical): Not on file  Physical Activity:   . Days of Exercise per Week: Not on file  . Minutes of Exercise per Session: Not on file  Stress:   . Feeling of Stress : Not on file  Social Connections:   . Frequency of Communication with Friends and Family: Not on file  . Frequency of Social Gatherings with Friends and Family: Not on file  . Attends Religious Services: Not on file  . Active Member of Clubs or Organizations: Not on file  . Attends Banker Meetings: Not on file  . Marital Status: Not on file  Intimate Partner Violence:   . Fear of Current or  Ex-Partner: Not on file  . Emotionally Abused: Not on file  . Physically Abused: Not on file  . Sexually Abused: Not on file      Review of Systems  Vital Signs: BP 106/70   Pulse 82   Temp (!) 97.4 F (36.3 C)   Resp 16   Ht 5\' 4"  (1.626 m)   Wt 172 lb 3.2 oz (78.1 kg)   SpO2 99%   BMI 29.56 kg/m    Physical Exam     Assessment/Plan: 1. Seasonal allergic rhinitis due to pollen Allergies are well controlled with Claritin.  2. Encounter for long-term (current) use of medications Appropriately positive for controlled prescribed medicine  - POCT Urine Drug Screen  3. Attention and concentration deficit Pt is willing to taper Adderall and start alternative therapy with Wellbutrin. A taper down plan was given to her. She will try to be on Wellbutrin until next visit.  - POCT Urine Drug Screen - amphetamine-dextroamphetamine (ADDERALL) 10 MG tablet; Take one tab po bid  Dispense: 60 tablet; Refill: 0 - amphetamine-dextroamphetamine (ADDERALL XR) 20 MG 24 hr capsule; Take 1 capsule (20 mg total) by mouth daily.  Dispense: 30 capsule; Refill: 0 - buPROPion (WELLBUTRIN XL) 150 MG 24 hr tablet; Take 1 tablet (150 mg total) by mouth daily.  Dispense: 30 tablet; Refill: 2 - amphetamine-dextroamphetamine (ADDERALL) 10 MG tablet; Take 1 tablet (10 mg total) by mouth 2 (two) times daily.  Dispense: 60 tablet; Refill: 0 - amphetamine-dextroamphetamine (ADDERALL XR) 20 MG 24 hr capsule; Take 1 capsule (20 mg total) by mouth every morning.  Dispense: 30 capsule; Refill: 0  General Counseling: Kendra verbalizes understanding of the findings of todays visit and agrees with plan of treatment. I have discussed any further diagnostic evaluation that may be needed or ordered today. We also reviewed her medications today. she has been encouraged to call the office with any questions or concerns that should arise related to todays visit.    Orders Placed This Encounter  Procedures  . POCT  Urine Drug Screen    Meds ordered this encounter  Medications  . amphetamine-dextroamphetamine (ADDERALL) 10 MG tablet    Sig: Take one tab po bid    Dispense:  60 tablet    Refill:  0  . amphetamine-dextroamphetamine (ADDERALL XR) 20 MG 24 hr capsule    Sig: Take 1 capsule (20 mg total) by mouth daily.    Dispense:  30 capsule    Refill:  0  . buPROPion (WELLBUTRIN XL) 150 MG 24 hr tablet    Sig: Take 1 tablet (150 mg total) by mouth daily.    Dispense:  30 tablet    Refill:  2  . amphetamine-dextroamphetamine (ADDERALL)  10 MG tablet    Sig: Take 1 tablet (10 mg total) by mouth 2 (two) times daily.    Dispense:  60 tablet    Refill:  0    To be picked up after 05/04/2020  . amphetamine-dextroamphetamine (ADDERALL XR) 20 MG 24 hr capsule    Sig: Take 1 capsule (20 mg total) by mouth every morning.    Dispense:  30 capsule    Refill:  0    To be picked up after 05/04/2020    Total time spent:35 Minutes Time spent includes review of chart, medications, test results, and follow up plan with the patient.      Dr Lyndon Code Internal medicine

## 2020-04-27 ENCOUNTER — Ambulatory Visit (INDEPENDENT_AMBULATORY_CARE_PROVIDER_SITE_OTHER): Payer: Self-pay | Admitting: Dermatology

## 2020-04-27 ENCOUNTER — Other Ambulatory Visit: Payer: Self-pay

## 2020-04-27 DIAGNOSIS — L988 Other specified disorders of the skin and subcutaneous tissue: Secondary | ICD-10-CM

## 2020-04-27 NOTE — Progress Notes (Unsigned)
   Follow-Up Visit   Subjective  Jamie Meadows is a 44 y.o. female who presents for the following: Procedure (Patient here today for Botox.).   The following portions of the chart were reviewed this encounter and updated as appropriate:   Tobacco  Allergies  Meds  Problems  Med Hx  Surg Hx  Fam Hx      Review of Systems:  No other skin or systemic complaints except as noted in HPI or Assessment and Plan.  Objective  Well appearing patient in no apparent distress; mood and affect are within normal limits.  A focused examination was performed including face. Relevant physical exam findings are noted in the Assessment and Plan.  Objective  Head - Anterior (Face): Rhytides and volume loss.   Images           Assessment & Plan  Elastosis of skin Head - Anterior (Face)  Botox Injection - Head - Anterior (Face) Location: See attached image  Informed consent: Discussed risks (infection, pain, bleeding, bruising, swelling, allergic reaction, paralysis of nearby muscles, eyelid droop, double vision, neck weakness, difficulty breathing, headache, undesirable cosmetic result, and need for additional treatment) and benefits of the procedure, as well as the alternatives.  Informed consent was obtained.  Preparation: The area was cleansed with alcohol.  Procedure Details:  Botox was injected into the dermis with a 30-gauge needle. Pressure applied to any bleeding. Ice packs offered for swelling.  Lot Number:  [id number] Expiration:  07/2022  Total Units Injected:  30  Plan: Patient was instructed to remain upright for 4 hours. Patient was instructed to avoid massaging the face and avoid vigorous exercise for the rest of the day. Tylenol may be used for headache.  Allow 2 weeks before returning to clinic for additional dosing as needed. Patient will call for any problems.   Return if symptoms worsen or fail to improve.  Anise Salvo, RMA, am acting as scribe  for Darden Dates, MD .  Documentation: I have reviewed the above documentation for accuracy and completeness, and I agree with the above.  Darden Dates, MD

## 2020-04-28 ENCOUNTER — Encounter: Payer: Self-pay | Admitting: Dermatology

## 2020-05-26 ENCOUNTER — Ambulatory Visit (INDEPENDENT_AMBULATORY_CARE_PROVIDER_SITE_OTHER): Payer: 59 | Admitting: Nurse Practitioner

## 2020-05-26 ENCOUNTER — Encounter: Payer: Self-pay | Admitting: Nurse Practitioner

## 2020-05-26 VITALS — BP 108/77 | HR 94 | Temp 97.6°F | Resp 16 | Ht 64.0 in | Wt 176.0 lb

## 2020-05-26 DIAGNOSIS — J0110 Acute frontal sinusitis, unspecified: Secondary | ICD-10-CM | POA: Diagnosis not present

## 2020-05-26 DIAGNOSIS — Z8616 Personal history of COVID-19: Secondary | ICD-10-CM | POA: Diagnosis not present

## 2020-05-26 MED ORDER — AZITHROMYCIN 250 MG PO TABS: ORAL_TABLET | ORAL | 1 refills | Status: DC

## 2020-05-26 NOTE — Progress Notes (Signed)
[facility] [address] [place], [place] [address]  Internal MEDICINE  Office Visit Note  Patient Name: Jamie Meadows  [id number]  [id number]  Date of Service: 05/26/2020    Pt is here for a sick visit.   Chief Complaint  Patient presents with  . Acute Visit  . Headache  . Sinusitis     The patient is here for acute visit.  -sinus type symptoms for about a week. Can't get rid of it.  -has done sinus lavage, taken tylenol, other OTC medication. Has not helped.  - sinus headache, nasal and sinus congestion, no fever.  -had COVID over christmas holiday. She and entire family had COVID. They are all fully vaccinated. Had symptoms of body aches and fatigue. This has improved.       Current Medication:  Outpatient Encounter Medications as of 05/26/2020  Medication Sig  . amphetamine-dextroamphetamine (ADDERALL XR) 20 MG 24 hr capsule Take 1 capsule (20 mg total) by mouth daily.  Marland Kitchen amphetamine-dextroamphetamine (ADDERALL XR) 20 MG 24 hr capsule Take 1 capsule (20 mg total) by mouth every morning.  Marland Kitchen amphetamine-dextroamphetamine (ADDERALL) 10 MG tablet Take one tab po bid  . amphetamine-dextroamphetamine (ADDERALL) 10 MG tablet Take 1 tablet (10 mg total) by mouth 2 (two) times daily.  Marland Kitchen azithromycin (ZITHROMAX) 250 MG tablet z-pack - take as directed for 5 days  . buPROPion (WELLBUTRIN XL) 150 MG 24 hr tablet Take 1 tablet (150 mg total) by mouth daily.  . cetirizine (ZYRTEC) 10 MG tablet Take 1 tablet (10 mg total) by mouth daily.  . fluticasone (FLONASE) 50 MCG/ACT nasal spray Place 2 sprays into both nostrils daily.  Marland Kitchen levonorgestrel (MIRENA) 20 MCG/24HR IUD 1 each by Intrauterine route once.  . loratadine (CLARITIN) 10 MG tablet Take 1 tablet (10 mg total) by mouth daily.  . Multiple Vitamin (MULTIVITAMIN) tablet Take 1 tablet by mouth daily.   No facility-administered encounter medications on file as of 05/26/2020.      Medical History: Past Medical  History:  Diagnosis Date  . Allergic rhinitis   . Fracture of clavicle with routine healing right  . History of abnormal cervical Pap smear 2017   LGSIL with HRHPV  . Sinusitis   . Wrist fracture, closed    left     Today's Vitals   05/26/20 0913  BP: 108/77  Pulse: 94  Resp: 16  Temp: 97.6 F (36.4 C)  SpO2: 99%  Weight: 176 lb (79.8 kg)  Height: 5\' 4"  (1.626 m)   Body mass index is 30.21 kg/m.  Review of Systems  Constitutional: Positive for chills and fatigue. Negative for activity change and fever.  HENT: Positive for congestion, postnasal drip, rhinorrhea, sinus pressure and sinus pain. Negative for ear pain, sore throat and voice change.   Respiratory: Negative for cough and wheezing.   Cardiovascular: Negative for chest pain and palpitations.  Gastrointestinal: Negative for nausea and vomiting.  Endocrine: Negative for cold intolerance, heat intolerance, polydipsia and polyuria.  Musculoskeletal: Positive for arthralgias and myalgias.  Skin: Negative.   Allergic/Immunologic: Positive for environmental allergies.  Neurological: Positive for headaches.    Physical Exam Vitals and nursing note reviewed.  Constitutional:      General: She is not in acute distress.    Appearance: Normal appearance. She is well-developed and well-nourished. She is not diaphoretic.  HENT:     Head: Normocephalic and atraumatic.     Right Ear: Tympanic membrane is erythematous and bulging.  Left Ear: Tympanic membrane is erythematous and bulging.     Nose: Congestion present.     Right Sinus: Maxillary sinus tenderness and frontal sinus tenderness present.     Left Sinus: Maxillary sinus tenderness and frontal sinus tenderness present.     Mouth/Throat:     Pharynx: Posterior oropharyngeal erythema present. No oropharyngeal exudate.  Eyes:     Extraocular Movements: EOM normal.     Pupils: Pupils are equal, round, and reactive to light.  Neck:     Thyroid: No thyromegaly.      Vascular: No JVD.     Trachea: No tracheal deviation.  Cardiovascular:     Rate and Rhythm: Normal rate and regular rhythm.     Heart sounds: Normal heart sounds. No murmur heard. No friction rub. No gallop.   Pulmonary:     Effort: Pulmonary effort is normal. No respiratory distress.     Breath sounds: Normal breath sounds. No wheezing or rales.  Chest:     Chest wall: No tenderness.  Abdominal:     Palpations: Abdomen is soft.  Musculoskeletal:        General: Normal range of motion.     Cervical back: Normal range of motion and neck supple.  Lymphadenopathy:     Cervical: Cervical adenopathy present.  Skin:    General: Skin is warm and dry.  Neurological:     Mental Status: She is alert and oriented to person, place, and time.     Cranial Nerves: No cranial nerve deficit.  Psychiatric:        Mood and Affect: Mood and affect and mood normal.        Behavior: Behavior normal.        Thought Content: Thought content normal.        Judgment: Judgment normal.   Assessment/Plan: 1. Acute non-recurrent frontal sinusitis Start z-pack. Take as directed for 5 days. Rest and increase fluids. Continue to take OTC medication as needed and as indicated for acute symptoms.  - azithromycin (ZITHROMAX) 250 MG tablet; z-pack - take as directed for 5 days  Dispense: 6 tablet; Refill: 1  2. Personal history of COVID-19 Diagnosed with COVID 19 05/14/2020 and has completed isolation period. Generally feeling better.   General Counseling: Jamie Meadows understanding of the findings of todays visit and agrees with plan of treatment. I have discussed any further diagnostic evaluation that may be needed or ordered today. We also reviewed her medications today. she has been encouraged to call the office with any questions or concerns that should arise related to todays visit.    Counseling:   Rest and increase fluids. Continue using OTC medication to control symptoms.   This patient  was seen by Vincent Gros FNP Collaboration with Dr Lyndon Code as a part of collaborative care agreement  Meds ordered this encounter  Medications  . azithromycin (ZITHROMAX) 250 MG tablet    Sig: z-pack - take as directed for 5 days    Dispense:  6 tablet    Refill:  1    Order Specific Question:   Supervising Provider    Answer:   Lyndon Code [[id number]]    Time spent: 25 Minutes

## 2020-06-02 ENCOUNTER — Other Ambulatory Visit: Payer: Self-pay

## 2020-06-02 ENCOUNTER — Encounter: Payer: Self-pay | Admitting: Hospice and Palliative Medicine

## 2020-06-02 ENCOUNTER — Ambulatory Visit: Payer: 59 | Admitting: Hospice and Palliative Medicine

## 2020-06-02 VITALS — BP 108/79 | HR 89 | Temp 98.0°F | Resp 16 | Ht 64.0 in | Wt 175.0 lb

## 2020-06-02 DIAGNOSIS — R41840 Attention and concentration deficit: Secondary | ICD-10-CM

## 2020-06-02 DIAGNOSIS — R5383 Other fatigue: Secondary | ICD-10-CM

## 2020-06-02 MED ORDER — AMPHETAMINE-DEXTROAMPHETAMINE 10 MG PO TABS: 10.0000 mg | ORAL_TABLET | Freq: Two times a day (BID) | ORAL | 0 refills | Status: DC

## 2020-06-02 MED ORDER — AMPHETAMINE-DEXTROAMPHET ER 20 MG PO CP24: 20.0000 mg | ORAL_CAPSULE | Freq: Every day | ORAL | 0 refills | Status: DC

## 2020-06-02 MED ORDER — AMPHETAMINE-DEXTROAMPHET ER 20 MG PO CP24: 20.0000 mg | ORAL_CAPSULE | ORAL | 0 refills | Status: DC

## 2020-06-02 NOTE — Progress Notes (Signed)
[facility] [address] [place], [place] [address]  Internal MEDICINE  Office Visit Note  Patient Name: Jamie Meadows  [id number]  [id number]  Date of Service: 06/05/2020  Chief Complaint  Patient presents with  . Follow-up  . ADHD    HPI Patient is here for routine follow-up We have been working on decreasing her dose of Adderall Currently, she is taking 20 mg XR daily and 10 mg as needed in the mornings--her biggest struggle is with the XR not giving her the boost of energy and motivation which is why she has been taking the 10 mg to help She has been weaning herself off of the 10 mg daily, only takes on days she is going to have a busy day Her goal is to be off of Adderall--requesting at this time to continue on current dose as her and her family are taking a trip to [facility] next week Her goal is to try and cut the 20 mg XR in half to 10 mg and continue to wean off of 10 mg regular release Adderall Has not yet started Wellbutrin--she would like to discontinue Adderall and not become reliant on a different medication to help with energy and concentration  She continues to sleep well at night, no negative effects of the medication, denies heart palpitations, chest pain or headaches  Continues to complain of fatigue during the day--has not yet had a chance to have her labs drawn--she has started taking daily vitamin supplements, B12 and vitamin D  Current Medication: Outpatient Encounter Medications as of 06/02/2020  Medication Sig  . amphetamine-dextroamphetamine (ADDERALL XR) 20 MG 24 hr capsule Take 1 capsule (20 mg total) by mouth every morning.  Marland Kitchen amphetamine-dextroamphetamine (ADDERALL) 10 MG tablet Take 1 tablet (10 mg total) by mouth 2 (two) times daily.  Marland Kitchen amphetamine-dextroamphetamine (ADDERALL) 10 MG tablet Take 1 tablet (10 mg total) by mouth 2 (two) times daily.  Marland Kitchen buPROPion (WELLBUTRIN XL) 150 MG 24 hr tablet Take 1 tablet (150 mg total) by mouth  daily.  . cetirizine (ZYRTEC) 10 MG tablet Take 1 tablet (10 mg total) by mouth daily.  . fluticasone (FLONASE) 50 MCG/ACT nasal spray Place 2 sprays into both nostrils daily.  Marland Kitchen levonorgestrel (MIRENA) 20 MCG/24HR IUD 1 each by Intrauterine route once.  . loratadine (CLARITIN) 10 MG tablet Take 1 tablet (10 mg total) by mouth daily.  . Multiple Vitamin (MULTIVITAMIN) tablet Take 1 tablet by mouth daily.  . [DISCONTINUED] amphetamine-dextroamphetamine (ADDERALL XR) 20 MG 24 hr capsule Take 1 capsule (20 mg total) by mouth daily.  . [DISCONTINUED] amphetamine-dextroamphetamine (ADDERALL XR) 20 MG 24 hr capsule Take 1 capsule (20 mg total) by mouth every morning.  . [DISCONTINUED] amphetamine-dextroamphetamine (ADDERALL) 10 MG tablet Take one tab po bid  . [DISCONTINUED] amphetamine-dextroamphetamine (ADDERALL) 10 MG tablet Take 1 tablet (10 mg total) by mouth 2 (two) times daily.  . [DISCONTINUED] azithromycin (ZITHROMAX) 250 MG tablet z-pack - take as directed for 5 days  . amphetamine-dextroamphetamine (ADDERALL XR) 20 MG 24 hr capsule Take 1 capsule (20 mg total) by mouth daily.   No facility-administered encounter medications on file as of 06/02/2020.    Surgical History: Past Surgical History:  Procedure Laterality Date  . ABDOMINOPLASTY  2017  . COLPOSCOPY  09/21/2015  . WISDOM TOOTH EXTRACTION      Medical History: Past Medical History:  Diagnosis Date  . ADHD   . Allergic rhinitis   . Fracture of clavicle with routine healing  right  . History of abnormal cervical Pap smear 2017   LGSIL with HRHPV  . Sinusitis   . Wrist fracture, closed    left    Family History: Family History  Problem Relation Age of Onset  . Diabetes Mother   . Kidney disease Mother   . Allergies Father   . Breast cancer Maternal Aunt 50  . Diabetes Maternal Grandfather   . Hypertension Maternal Grandfather   . Cancer Sister     Social History   Socioeconomic History  . Marital status:  Divorced    Spouse name: Not on file  . Number of children: 4  . Years of education: 5  . Highest education level: Not on file  Occupational History  . 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 Smoking status: Former Games developer  . Smokeless tobacco: Never Used  Vaping Use  . Vaping Use: Never used  Substance and Sexual Activity  . Alcohol use: Yes    Alcohol/week: 7.0 standard drinks    Types: 7 Glasses of wine per week    Comment: occasionally  . Drug use: Never  . Sexual activity: Yes    Birth control/protection: I.U.D.  Other Topics Concern  . Not on file  Social History Narrative  . Not on file   Social Determinants of Health   Financial Resource Strain: Not on file  Food Insecurity: Not on file  Transportation Needs: Not on file  Physical Activity: Not on file  Stress: Not on file  Social Connections: Not on file  Intimate Partner Violence: Not on file      Review of Systems  Constitutional: Positive for fatigue. Negative for chills and diaphoresis.  HENT: Negative for ear pain, postnasal drip and sinus pressure.   Eyes: Negative for photophobia, discharge, redness, itching and visual disturbance.  Respiratory: Negative for cough, shortness of breath and wheezing.   Cardiovascular: Negative for chest pain, palpitations and leg swelling.  Gastrointestinal: Negative for abdominal pain, constipation, diarrhea, nausea and vomiting.  Genitourinary: Negative for dysuria and flank pain.  Musculoskeletal: Negative for arthralgias, back pain, gait problem and neck pain.  Skin: Negative for color change.  Allergic/Immunologic: Negative for environmental allergies and food allergies.  Neurological: Negative for dizziness and headaches.  Hematological: Does not bruise/bleed easily.  Psychiatric/Behavioral: Negative for agitation, behavioral problems (depression) and hallucinations.    Vital Signs: BP 108/79   Pulse 89   Temp 98 F (36.7 C)   Resp 16   Ht 5'  4" (1.626 m)   Wt 175 lb (79.4 kg)   SpO2 99%   BMI 30.04 kg/m    Physical Exam Constitutional:      Appearance: Normal appearance. She is normal weight.  Cardiovascular:     Rate and Rhythm: Normal rate and regular rhythm.     Pulses: Normal pulses.     Heart sounds: Normal heart sounds.  Pulmonary:     Effort: Pulmonary effort is normal.     Breath sounds: Normal breath sounds.  Abdominal:     General: Abdomen is flat.     Palpations: Abdomen is soft.  Musculoskeletal:        General: Normal range of motion.     Cervical back: Normal range of motion.  Skin:    General: Skin is warm.  Neurological:     General: No focal deficit present.     Mental Status: She is alert and oriented to person, place, and time. Mental status is at  baseline.  Psychiatric:        Mood and Affect: Mood normal.        Behavior: Behavior normal.        Thought Content: Thought content normal.        Judgment: Judgment normal.    Assessment/Plan: 1. Other fatigue Encouraged to have labs drawn Continue with vitamin supplements No evidence of sleep disorder--continue to monitor  2. Attention and concentration deficit Refills of current dosage continued to today Advised to start Wellbutrin to also help with weaning herself from Adderall--discussed Wellbutrin can be temporary Wean from taking 10 mg as often, only use as needed--XR to not be cut in half--as she weans down from regular release will consider titrating down XR dosage - amphetamine-dextroamphetamine (ADDERALL XR) 20 MG 24 hr capsule; Take 1 capsule (20 mg total) by mouth daily.  Dispense: 30 capsule; Refill: 0 - amphetamine-dextroamphetamine (ADDERALL XR) 20 MG 24 hr capsule; Take 1 capsule (20 mg total) by mouth every morning.  Dispense: 30 capsule; Refill: 0 - amphetamine-dextroamphetamine (ADDERALL) 10 MG tablet; Take 1 tablet (10 mg total) by mouth 2 (two) times daily.  Dispense: 60 tablet; Refill: 0 - amphetamine-dextroamphetamine  (ADDERALL) 10 MG tablet; Take 1 tablet (10 mg total) by mouth 2 (two) times daily.  Dispense: 60 tablet; Refill: 0  General Counseling: Zaylynn verbalizes understanding of the findings of todays visit and agrees with plan of treatment. I have discussed any further diagnostic evaluation that may be needed or ordered today. We also reviewed her medications today. she has been encouraged to call the office with any questions or concerns that should arise related to todays visit.      Meds ordered this encounter  Medications  . amphetamine-dextroamphetamine (ADDERALL XR) 20 MG 24 hr capsule    Sig: Take 1 capsule (20 mg total) by mouth daily.    Dispense:  30 capsule    Refill:  0  . amphetamine-dextroamphetamine (ADDERALL XR) 20 MG 24 hr capsule    Sig: Take 1 capsule (20 mg total) by mouth every morning.    Dispense:  30 capsule    Refill:  0    Do not fill prior to 2/11.  Marland Kitchen amphetamine-dextroamphetamine (ADDERALL) 10 MG tablet    Sig: Take 1 tablet (10 mg total) by mouth 2 (two) times daily.    Dispense:  60 tablet    Refill:  0  . amphetamine-dextroamphetamine (ADDERALL) 10 MG tablet    Sig: Take 1 tablet (10 mg total) by mouth 2 (two) times daily.    Dispense:  60 tablet    Refill:  0    Do not fill before 2/11    Time spent: 30 Minutes Time spent includes review of chart, medications, test results and follow-up plan with the patient.  This patient was seen by Leeanne Deed AGNP-C in Collaboration with Dr Lyndon Code as a part of collaborative care agreement     Lubertha Basque. Kaila Devries AGNP-C Internal medicine

## 2020-06-03 LAB — BASIC METABOLIC PANEL: Glucose: 92

## 2020-06-04 LAB — TSH+FREE T4
Free T4: 1.45 ng/dL (ref 0.82–1.77)
TSH: 2.82 u[IU]/mL (ref 0.450–4.500)

## 2020-06-04 LAB — COMPREHENSIVE METABOLIC PANEL
ALT: 21 IU/L (ref 0–32)
AST: 21 IU/L (ref 0–40)
Albumin/Globulin Ratio: 1.7 (ref 1.2–2.2)
Albumin: 4.5 g/dL (ref 3.8–4.8)
Alkaline Phosphatase: 58 IU/L (ref 44–121)
BUN/Creatinine Ratio: 23 (ref 9–23)
BUN: 18 mg/dL (ref 6–24)
Bilirubin Total: 0.6 mg/dL (ref 0.0–1.2)
CO2: 22 mmol/L (ref 20–29)
Calcium: 9.8 mg/dL (ref 8.7–10.2)
Chloride: 101 mmol/L (ref 96–106)
Creatinine, Ser: 0.79 mg/dL (ref 0.57–1.00)
GFR calc Af Amer: 107 mL/min/{1.73_m2} (ref >59)
GFR calc non Af Amer: 93 mL/min/{1.73_m2} (ref >59)
Globulin, Total: 2.6 g/dL (ref 1.5–4.5)
Glucose: 92 mg/dL (ref 65–99)
Potassium: 4.5 mmol/L (ref 3.5–5.2)
Sodium: 141 mmol/L (ref 134–144)
Total Protein: 7.1 g/dL (ref 6.0–8.5)

## 2020-06-04 LAB — CBC WITH DIFFERENTIAL/PLATELET
Basophils Absolute: 0.1 10*3/uL (ref 0.0–0.2)
Basos: 1 %
EOS (ABSOLUTE): 0.1 10*3/uL (ref 0.0–0.4)
Eos: 1 %
Hematocrit: 41.7 % (ref 34.0–46.6)
Hemoglobin: 14.2 g/dL (ref 11.1–15.9)
Immature Grans (Abs): 0 10*3/uL (ref 0.0–0.1)
Immature Granulocytes: 0 %
Lymphocytes Absolute: 2.5 10*3/uL (ref 0.7–3.1)
Lymphs: 33 %
MCH: 31.9 pg (ref 26.6–33.0)
MCHC: 34.1 g/dL (ref 31.5–35.7)
MCV: 94 fL (ref 79–97)
Monocytes Absolute: 0.7 10*3/uL (ref 0.1–0.9)
Monocytes: 9 %
Neutrophils Absolute: 4.3 10*3/uL (ref 1.4–7.0)
Neutrophils: 56 %
Platelets: 338 10*3/uL (ref 150–450)
RBC: 4.45 x10E6/uL (ref 3.77–5.28)
RDW: 11.8 % (ref 11.7–15.4)
WBC: 7.6 10*3/uL (ref 3.4–10.8)

## 2020-06-04 LAB — VITAMIN D 25 HYDROXY (VIT D DEFICIENCY, FRACTURES): Vit D, 25-Hydroxy: 38.5 ng/mL (ref 30.0–100.0)

## 2020-06-04 LAB — IRON AND TIBC
Iron Saturation: 34 % (ref 15–55)
Iron: 129 ug/dL (ref 27–159)
Total Iron Binding Capacity: 375 ug/dL (ref 250–450)
UIBC: 246 ug/dL (ref 131–425)

## 2020-06-04 LAB — VITAMIN B12: Vitamin B-12: 936 pg/mL (ref 232–1245)

## 2020-06-04 LAB — FERRITIN: Ferritin: 64 ng/mL (ref 15–150)

## 2020-06-04 LAB — FOLATE: Folate: 11.3 ng/mL (ref >3.0)

## 2020-06-04 LAB — LIPID PANEL W/O CHOL/HDL RATIO
Cholesterol, Total: 168 mg/dL (ref 100–199)
HDL: 74 mg/dL (ref >39)
LDL Chol Calc (NIH): 80 mg/dL (ref 0–99)
Triglycerides: 71 mg/dL (ref 0–149)
VLDL Cholesterol Cal: 14 mg/dL (ref 5–40)

## 2020-06-05 ENCOUNTER — Encounter: Payer: Self-pay | Admitting: Hospice and Palliative Medicine

## 2020-07-29 ENCOUNTER — Encounter: Payer: Self-pay | Admitting: Hospice and Palliative Medicine

## 2020-07-29 ENCOUNTER — Other Ambulatory Visit: Payer: Self-pay

## 2020-07-29 ENCOUNTER — Ambulatory Visit: Payer: 59 | Admitting: Hospice and Palliative Medicine

## 2020-07-29 VITALS — BP 94/74 | HR 97 | Temp 97.3°F | Resp 16 | Ht 64.0 in | Wt 179.0 lb

## 2020-07-29 DIAGNOSIS — F411 Generalized anxiety disorder: Secondary | ICD-10-CM | POA: Diagnosis not present

## 2020-07-29 DIAGNOSIS — J301 Allergic rhinitis due to pollen: Secondary | ICD-10-CM | POA: Diagnosis not present

## 2020-07-29 DIAGNOSIS — R41840 Attention and concentration deficit: Secondary | ICD-10-CM | POA: Diagnosis not present

## 2020-07-29 MED ORDER — AMPHETAMINE-DEXTROAMPHET ER 20 MG PO CP24: 20.0000 mg | ORAL_CAPSULE | Freq: Every day | ORAL | 0 refills | Status: DC

## 2020-07-29 MED ORDER — AMPHETAMINE-DEXTROAMPHETAMINE 10 MG PO TABS: 10.0000 mg | ORAL_TABLET | Freq: Two times a day (BID) | ORAL | 0 refills | Status: DC

## 2020-07-29 MED ORDER — BUPROPION HCL ER (XL) 150 MG PO TB24: 150.0000 mg | ORAL_TABLET | Freq: Every day | ORAL | 2 refills | Status: DC

## 2020-07-29 NOTE — Progress Notes (Signed)
[facility] [address] [place], [place] [address]  Internal MEDICINE  Office Visit Note  Patient Name: Jamie Meadows  [id number]  [id number]  Date of Service: 08/01/2020  Chief Complaint  Patient presents with  . Follow-up    Discuss meds  . ADHD    HPI Patient is here for routine follow-up Follow-up for ADHD--have been working on decreasing frequency and lowering dose of Adderall, currently taking 20 mg XR and 10 mg IR as needed, does continue to feel she is adjusting to this current dose, has been working on not taking 10 mg IR as frequently Again, has not yet tried Wellbutrin as her goal was to not be reliant upon any medications  Denies negative side effects from mediation, denies chest pain, palpitations or insomnia  Reviewed recent labs--stable Up to date on mammogram screenings  Current Medication: Outpatient Encounter Medications as of 07/29/2020  Medication Sig  . [DISCONTINUED] amphetamine-dextroamphetamine (ADDERALL) 10 MG tablet Take 1 tablet (10 mg total) by mouth 2 (two) times daily.  Marland Kitchen amphetamine-dextroamphetamine (ADDERALL XR) 20 MG 24 hr capsule Take 1 capsule (20 mg total) by mouth daily.  Marland Kitchen amphetamine-dextroamphetamine (ADDERALL) 10 MG tablet Take 1 tablet (10 mg total) by mouth 2 (two) times daily.  Marland Kitchen buPROPion (WELLBUTRIN XL) 150 MG 24 hr tablet Take 1 tablet (150 mg total) by mouth daily.  . cetirizine (ZYRTEC) 10 MG tablet Take 1 tablet (10 mg total) by mouth daily.  . fluticasone (FLONASE) 50 MCG/ACT nasal spray Place 2 sprays into both nostrils daily.  Marland Kitchen levonorgestrel (MIRENA) 20 MCG/24HR IUD 1 each by Intrauterine route once.  . loratadine (CLARITIN) 10 MG tablet Take 1 tablet (10 mg total) by mouth daily.  . Multiple Vitamin (MULTIVITAMIN) tablet Take 1 tablet by mouth daily.  . [DISCONTINUED] amphetamine-dextroamphetamine (ADDERALL XR) 20 MG 24 hr capsule Take 1 capsule (20 mg total) by mouth daily.  . [DISCONTINUED]  amphetamine-dextroamphetamine (ADDERALL XR) 20 MG 24 hr capsule Take 1 capsule (20 mg total) by mouth every morning.  . [DISCONTINUED] amphetamine-dextroamphetamine (ADDERALL XR) 20 MG 24 hr capsule Take 1 capsule (20 mg total) by mouth daily.  . [DISCONTINUED] amphetamine-dextroamphetamine (ADDERALL) 10 MG tablet Take 1 tablet (10 mg total) by mouth 2 (two) times daily.  . [DISCONTINUED] amphetamine-dextroamphetamine (ADDERALL) 10 MG tablet Take 1 tablet (10 mg total) by mouth 2 (two) times daily.  . [DISCONTINUED] buPROPion (WELLBUTRIN XL) 150 MG 24 hr tablet Take 1 tablet (150 mg total) by mouth daily.   No facility-administered encounter medications on file as of 07/29/2020.    Surgical History: Past Surgical History:  Procedure Laterality Date  . ABDOMINOPLASTY  2017  . COLPOSCOPY  09/21/2015  . WISDOM TOOTH EXTRACTION      Medical History: Past Medical History:  Diagnosis Date  . ADHD   . Allergic rhinitis   . Fracture of clavicle with routine healing right  . History of abnormal cervical Pap smear 2017   LGSIL with HRHPV  . Sinusitis   . Wrist fracture, closed    left    Family History: Family History  Problem Relation Age of Onset  . Diabetes Mother   . Kidney disease Mother   . Allergies Father   . Breast cancer Maternal Aunt 50  . Diabetes Maternal Grandfather   . Hypertension Maternal Grandfather   . Cancer Sister     Social History   Socioeconomic History  . Marital status: Divorced    Spouse name: Not on file  .  Number of children: 4  . Years of education: 10  . Highest education level: Not on file  Occupational History  . 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 Smoking status: Former Games developer  . Smokeless tobacco: Never Used  Vaping Use  . Vaping Use: Never used  Substance and Sexual Activity  . Alcohol use: Yes    Alcohol/week: 7.0 standard drinks    Types: 7 Glasses of wine per week    Comment: occasionally  . Drug use: Never  .  Sexual activity: Yes    Birth control/protection: I.U.D.  Other Topics Concern  . Not on file  Social History Narrative  . Not on file   Social Determinants of Health   Financial Resource Strain: Not on file  Food Insecurity: Not on file  Transportation Needs: Not on file  Physical Activity: Not on file  Stress: Not on file  Social Connections: Not on file  Intimate Partner Violence: Not on file      Review of Systems  Constitutional: Negative for chills, diaphoresis and fatigue.  HENT: Negative for ear pain, postnasal drip and sinus pressure.   Eyes: Negative for photophobia, discharge, redness, itching and visual disturbance.  Respiratory: Negative for cough, shortness of breath and wheezing.   Cardiovascular: Negative for chest pain, palpitations and leg swelling.  Gastrointestinal: Negative for abdominal pain, constipation, diarrhea, nausea and vomiting.  Genitourinary: Negative for dysuria and flank pain.  Musculoskeletal: Negative for arthralgias, back pain, gait problem and neck pain.  Skin: Negative for color change.  Allergic/Immunologic: Negative for environmental allergies and food allergies.  Neurological: Negative for dizziness and headaches.  Hematological: Does not bruise/bleed easily.  Psychiatric/Behavioral: Negative for agitation, behavioral problems (depression) and hallucinations.    Vital Signs: BP 94/74   Pulse 97   Temp (!) 97.3 F (36.3 C)   Resp 16   Ht 5\' 4"  (1.626 m)   Wt 179 lb (81.2 kg)   SpO2 99%   BMI 30.73 kg/m    Physical Exam Vitals reviewed.  Constitutional:      Appearance: Normal appearance. She is normal weight.  Cardiovascular:     Rate and Rhythm: Normal rate and regular rhythm.     Pulses: Normal pulses.     Heart sounds: Normal heart sounds.  Pulmonary:     Effort: Pulmonary effort is normal.     Breath sounds: Normal breath sounds.  Abdominal:     General: Abdomen is flat.     Palpations: Abdomen is soft.   Musculoskeletal:        General: Normal range of motion.     Cervical back: Normal range of motion.  Skin:    General: Skin is warm.  Neurological:     General: No focal deficit present.     Mental Status: She is alert and oriented to person, place, and time. Mental status is at baseline.  Psychiatric:        Mood and Affect: Mood normal.        Behavior: Behavior normal.        Thought Content: Thought content normal.        Judgment: Judgment normal.    Assessment/Plan: 1. Seasonal allergic rhinitis due to pollen Stable at this time, continue with current regimen  2. GAD (generalized anxiety disorder) Advised to start Wellbutrin to help with managing anxiety symptoms likely contributing to symptoms of ADHD - buPROPion (WELLBUTRIN XL) 150 MG 24 hr tablet; Take 1 tablet (150 mg total)  by mouth daily.  Dispense: 30 tablet; Refill: 2  3. Attention and concentration deficit Continue with current dose of Adderall--continue to slowly wean dosing and frequency [place] Controlled Substance Database was reviewed by me for overdose risk score (ORS) - amphetamine-dextroamphetamine (ADDERALL XR) 20 MG 24 hr capsule; Take 1 capsule (20 mg total) by mouth daily.  Dispense: 30 capsule; Refill: 0 - amphetamine-dextroamphetamine (ADDERALL) 10 MG tablet; Take 1 tablet (10 mg total) by mouth 2 (two) times daily.  Dispense: 60 tablet; Refill: 0  General Counseling: Jeraldin verbalizes understanding of the findings of todays visit and agrees with plan of treatment. I have discussed any further diagnostic evaluation that may be needed or ordered today. We also reviewed her medications today. she has been encouraged to call the office with any questions or concerns that should arise related to todays visit.   Meds ordered this encounter  Medications  . DISCONTD: amphetamine-dextroamphetamine (ADDERALL XR) 20 MG 24 hr capsule    Sig: Take 1 capsule (20 mg total) by mouth daily.    Dispense:  30 capsule     Refill:  0  . DISCONTD: amphetamine-dextroamphetamine (ADDERALL) 10 MG tablet    Sig: Take 1 tablet (10 mg total) by mouth 2 (two) times daily.    Dispense:  60 tablet    Refill:  0  . buPROPion (WELLBUTRIN XL) 150 MG 24 hr tablet    Sig: Take 1 tablet (150 mg total) by mouth daily.    Dispense:  30 tablet    Refill:  2  . amphetamine-dextroamphetamine (ADDERALL XR) 20 MG 24 hr capsule    Sig: Take 1 capsule (20 mg total) by mouth daily.    Dispense:  30 capsule    Refill:  0    Do not fill prior to April 8th  . amphetamine-dextroamphetamine (ADDERALL) 10 MG tablet    Sig: Take 1 tablet (10 mg total) by mouth 2 (two) times daily.    Dispense:  60 tablet    Refill:  0    Do not fill prior to April 8th    Time spent: 30 Minutes Time spent includes review of chart, medications, test results and follow-up plan with the patient.  This patient was seen by Leeanne Deed AGNP-C in Collaboration with Dr Lyndon Code as a part of collaborative care agreement     Lubertha Basque. Jarold Macomber AGNP-C Internal medicine

## 2020-08-01 ENCOUNTER — Encounter: Payer: Self-pay | Admitting: Hospice and Palliative Medicine

## 2020-09-28 ENCOUNTER — Encounter: Payer: Self-pay | Admitting: Nurse Practitioner

## 2020-09-28 ENCOUNTER — Other Ambulatory Visit: Payer: Self-pay

## 2020-09-28 ENCOUNTER — Ambulatory Visit: Payer: 59 | Admitting: Nurse Practitioner

## 2020-09-28 DIAGNOSIS — R41840 Attention and concentration deficit: Secondary | ICD-10-CM

## 2020-09-28 DIAGNOSIS — R5383 Other fatigue: Secondary | ICD-10-CM

## 2020-09-28 DIAGNOSIS — Z79899 Other long term (current) drug therapy: Secondary | ICD-10-CM | POA: Diagnosis not present

## 2020-09-28 DIAGNOSIS — J301 Allergic rhinitis due to pollen: Secondary | ICD-10-CM | POA: Diagnosis not present

## 2020-09-28 LAB — POCT URINE DRUG SCREEN
Methylenedioxyamphetamine: NOT DETECTED
POC Amphetamine UR: POSITIVE — AB
POC BENZODIAZEPINES UR: NOT DETECTED
POC Barbiturate UR: NOT DETECTED
POC Cocaine UR: NOT DETECTED
POC Ecstasy UR: NOT DETECTED
POC Marijuana UR: NOT DETECTED
POC Methadone UR: NOT DETECTED
POC Methamphetamine UR: NOT DETECTED
POC Opiate Ur: NOT DETECTED
POC Oxycodone UR: NOT DETECTED
POC PHENCYCLIDINE UR: NOT DETECTED
POC TRICYCLICS UR: NOT DETECTED

## 2020-09-28 MED ORDER — AMPHETAMINE-DEXTROAMPHET ER 20 MG PO CP24: 20.0000 mg | ORAL_CAPSULE | Freq: Every day | ORAL | 0 refills | Status: DC

## 2020-09-28 MED ORDER — AMPHETAMINE-DEXTROAMPHETAMINE 10 MG PO TABS: 10.0000 mg | ORAL_TABLET | Freq: Two times a day (BID) | ORAL | 0 refills | Status: DC

## 2020-09-28 MED ORDER — FLUTICASONE PROPIONATE 50 MCG/ACT NA SUSP
2.0000 | Freq: Every day | NASAL | 2 refills | Status: DC
Start: 2020-09-28 — End: 2020-12-22

## 2020-09-28 NOTE — Progress Notes (Signed)
[facility] [address] [place], [place] [address]  Internal MEDICINE  Office Visit Note  Patient Name: Jamie Meadows  [id number]  [id number]  Date of Service: 09/28/2020  Chief Complaint  Patient presents with  . Follow-up    Allergy shots, refills, blood work, drowsiness all the time, last few months has been more noticeable   . ADHD    HPI Pt is here for routine follow up  -wants to get back on allergy shots -feel fatigued, napping during day, not able to lose weight even with dieting, feeling bloated. Iron levels nl as of January. Thyroid panel nl.  -adhd controlled, needs refills.    Current Medication: Outpatient Encounter Medications as of 09/28/2020  Medication Sig  . cetirizine (ZYRTEC) 10 MG tablet Take 1 tablet (10 mg total) by mouth daily.  Marland Kitchen levonorgestrel (MIRENA) 20 MCG/24HR IUD 1 each by Intrauterine route once.  . loratadine (CLARITIN) 10 MG tablet Take 1 tablet (10 mg total) by mouth daily.  . Multiple Vitamin (MULTIVITAMIN) tablet Take 1 tablet by mouth daily.  . [DISCONTINUED] amphetamine-dextroamphetamine (ADDERALL XR) 20 MG 24 hr capsule Take 1 capsule (20 mg total) by mouth daily.  . [DISCONTINUED] amphetamine-dextroamphetamine (ADDERALL) 10 MG tablet Take 1 tablet (10 mg total) by mouth 2 (two) times daily.  . [DISCONTINUED] buPROPion (WELLBUTRIN XL) 150 MG 24 hr tablet Take 1 tablet (150 mg total) by mouth daily.  . [DISCONTINUED] fluticasone (FLONASE) 50 MCG/ACT nasal spray Place 2 sprays into both nostrils daily.  Marland Kitchen amphetamine-dextroamphetamine (ADDERALL XR) 20 MG 24 hr capsule Take 1 capsule (20 mg total) by mouth daily.  Marland Kitchen amphetamine-dextroamphetamine (ADDERALL) 10 MG tablet Take 1 tablet (10 mg total) by mouth 2 (two) times daily.  . fluticasone (FLONASE) 50 MCG/ACT nasal spray Place 2 sprays into both nostrils daily.   No facility-administered encounter medications on file as of 09/28/2020.    Surgical History: Past Surgical  History:  Procedure Laterality Date  . ABDOMINOPLASTY  2017  . COLPOSCOPY  09/21/2015  . WISDOM TOOTH EXTRACTION      Medical History: Past Medical History:  Diagnosis Date  . ADHD   . Allergic rhinitis   . Fracture of clavicle with routine healing right  . History of abnormal cervical Pap smear 2017   LGSIL with HRHPV  . Sinusitis   . Wrist fracture, closed    left    Family History: Family History  Problem Relation Age of Onset  . Diabetes Mother   . Kidney disease Mother   . Allergies Father   . Breast cancer Maternal Aunt 50  . Diabetes Maternal Grandfather   . Hypertension Maternal Grandfather   . Cancer Sister     Social History   Socioeconomic History  . Marital status: Divorced    Spouse name: Not on file  . Number of children: 4  . Years of education: 43  . Highest education level: Not on file  Occupational History  . 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 Smoking status: Former Games developer  . Smokeless tobacco: Never Used  Vaping Use  . Vaping Use: Never used  Substance and Sexual Activity  . Alcohol use: Yes    Alcohol/week: 7.0 standard drinks    Types: 7 Glasses of wine per week    Comment: occasionally  . Drug use: Never  . Sexual activity: Yes    Birth control/protection: I.U.D.  Other Topics Concern  . Not on file  Social History Narrative  .  Not on file   Social Determinants of Health   Financial Resource Strain: Not on file  Food Insecurity: Not on file  Transportation Needs: Not on file  Physical Activity: Not on file  Stress: Not on file  Social Connections: Not on file  Intimate Partner Violence: Not on file      Review of Systems  Constitutional: Positive for fatigue.  HENT: Positive for congestion, postnasal drip, rhinorrhea, sinus pain and sneezing.   Eyes: Positive for redness and itching.  Respiratory: Negative.   Cardiovascular: Negative.   Allergic/Immunologic: Positive for environmental allergies.   Neurological: Positive for headaches.    Vital Signs: BP 90/74   Pulse 87   Temp (!) 97.4 F (36.3 C)   Resp 16   Ht 5\' 4"  (1.626 m)   Wt 182 lb 12.8 oz (82.9 kg)   SpO2 99%   BMI 31.38 kg/m    Physical Exam Constitutional:      Appearance: Normal appearance. She is normal weight.  HENT:     Head: Normocephalic and atraumatic.  Cardiovascular:     Pulses: Normal pulses.     Heart sounds: Normal heart sounds.  Pulmonary:     Effort: Pulmonary effort is normal.     Breath sounds: Normal breath sounds.  Skin:    General: Skin is warm and dry.     Capillary Refill: Capillary refill takes less than 2 seconds.  Neurological:     Mental Status: She is alert and oriented to person, place, and time.        Assessment/Plan: 1. Fatigue, unspecified type Fatigue has been an ongoing problem, she is having additional symptoms associated with low progesterone levels and perimenopause. Additional labs have been ordered to rule out other causes of fatigue. - Iron, TIBC and Ferritin Panel - Progesterone - B12 and Folate Panel - Vitamin D 1,25 dihydroxy; Future - CBC with Differential/Platelet - TSH + free T4  2. Seasonal allergic rhinitis due to pollen Allergic rhinitis symptoms not well managed with medications. [name] was previously taking allergy shots and would like to be retested to restart allergy shots again. Pt has done allergy shots and allergy drops previously. -allergy testing ordered to be done at [facility], office will call pt to schedule.   - fluticasone (FLONASE) 50 MCG/ACT nasal spray; Place 2 sprays into both nostrils daily.  Dispense: 16 g; Refill: 2  3. Attention and concentration deficit Current medication regimen is managing ADHD symptoms well per pt. Pt denies any medication side effects at this time. Refill of adderall XR and adderall IR ordered. F/u in 1 month.  4. Encounter for long-term (current) use of medications Jamie Meadows takes adderall for ADHD, UDS  specimen obtained to monitor medication compliance.  - POCT Urine Drug Screen    General Counseling: Jamie Meadows verbalizes understanding of the findings of todays visit and agrees with plan of treatment. I have discussed any further diagnostic evaluation that may be needed or ordered today. We also reviewed her medications today. she has been encouraged to call the office with any questions or concerns that should arise related to todays visit.    Orders Placed This Encounter  Procedures  . Iron, TIBC and Ferritin Panel  . Progesterone  . B12 and Folate Panel  . Vitamin D 1,25 dihydroxy  . CBC with Differential/Platelet  . TSH + free T4  . POCT Urine Drug Screen    Meds ordered this encounter  Medications  . fluticasone (FLONASE) 50 MCG/ACT nasal spray  Sig: Place 2 sprays into both nostrils daily.    Dispense:  16 g    Refill:  2  . amphetamine-dextroamphetamine (ADDERALL) 10 MG tablet    Sig: Take 1 tablet (10 mg total) by mouth 2 (two) times daily.    Dispense:  60 tablet    Refill:  0    For may  . amphetamine-dextroamphetamine (ADDERALL XR) 20 MG 24 hr capsule    Sig: Take 1 capsule (20 mg total) by mouth daily.    Dispense:  30 capsule    Refill:  0    For MAY 2022   Return in about 4 weeks (around 10/26/2020) for F/U, ADHD med check, Khayri Kargbo PCP.   Total time spent: 30 Minutes Time spent includes review of chart, medications, test results, and follow up plan with the patient.   [place] Controlled Substance Database was reviewed by me.   Dr Lyndon Code Internal medicine

## 2020-09-30 LAB — CBC WITH DIFFERENTIAL/PLATELET
Basophils Absolute: 0.1 10*3/uL (ref 0.0–0.2)
Basos: 1 %
EOS (ABSOLUTE): 0.1 10*3/uL (ref 0.0–0.4)
Eos: 2 %
Hematocrit: 39.6 % (ref 34.0–46.6)
Hemoglobin: 13.1 g/dL (ref 11.1–15.9)
Immature Grans (Abs): 0 10*3/uL (ref 0.0–0.1)
Immature Granulocytes: 0 %
Lymphocytes Absolute: 2.3 10*3/uL (ref 0.7–3.1)
Lymphs: 33 %
MCH: 31.5 pg (ref 26.6–33.0)
MCHC: 33.1 g/dL (ref 31.5–35.7)
MCV: 95 fL (ref 79–97)
Monocytes Absolute: 0.7 10*3/uL (ref 0.1–0.9)
Monocytes: 9 %
Neutrophils Absolute: 3.8 10*3/uL (ref 1.4–7.0)
Neutrophils: 55 %
Platelets: 309 10*3/uL (ref 150–450)
RBC: 4.16 x10E6/uL (ref 3.77–5.28)
RDW: 11.7 % (ref 11.7–15.4)
WBC: 7 10*3/uL (ref 3.4–10.8)

## 2020-09-30 LAB — IRON,TIBC AND FERRITIN PANEL
Ferritin: 44 ng/mL (ref 15–150)
Iron Saturation: 24 % (ref 15–55)
Iron: 81 ug/dL (ref 27–159)
Total Iron Binding Capacity: 342 ug/dL (ref 250–450)
UIBC: 261 ug/dL (ref 131–425)

## 2020-09-30 LAB — B12 AND FOLATE PANEL
Folate: 6.3 ng/mL (ref >3.0)
Vitamin B-12: 621 pg/mL (ref 232–1245)

## 2020-09-30 LAB — TSH+FREE T4
Free T4: 1.11 ng/dL (ref 0.82–1.77)
TSH: 2.73 u[IU]/mL (ref 0.450–4.500)

## 2020-09-30 LAB — PROGESTERONE: Progesterone: 0.2 ng/mL

## 2020-10-20 ENCOUNTER — Encounter: Payer: Self-pay | Admitting: Nurse Practitioner

## 2020-10-20 ENCOUNTER — Ambulatory Visit: Payer: 59 | Admitting: Nurse Practitioner

## 2020-10-20 ENCOUNTER — Other Ambulatory Visit: Payer: Self-pay

## 2020-10-20 VITALS — BP 116/82 | HR 81 | Temp 98.5°F | Resp 16 | Ht 64.0 in | Wt 181.0 lb

## 2020-10-20 DIAGNOSIS — R5383 Other fatigue: Secondary | ICD-10-CM

## 2020-10-20 DIAGNOSIS — R41840 Attention and concentration deficit: Secondary | ICD-10-CM

## 2020-10-20 DIAGNOSIS — J301 Allergic rhinitis due to pollen: Secondary | ICD-10-CM | POA: Diagnosis not present

## 2020-10-20 MED ORDER — AMPHETAMINE-DEXTROAMPHET ER 20 MG PO CP24: 20.0000 mg | ORAL_CAPSULE | Freq: Every day | ORAL | 0 refills | Status: DC

## 2020-10-20 MED ORDER — AMPHETAMINE-DEXTROAMPHETAMINE 10 MG PO TABS: 10.0000 mg | ORAL_TABLET | Freq: Two times a day (BID) | ORAL | 0 refills | Status: DC

## 2020-10-20 NOTE — Progress Notes (Signed)
[facility] [address] [place], [place] [address]  Internal MEDICINE  Office Visit Note  Patient Name: Jamie GeorgiaJennifer B Kellis  [id number]  [id number]  Date of Service: 10/22/2020  Chief Complaint  Patient presents with  . Follow-up    Review labs    HPI Victorino DikeJennifer presents for a follow-up visit to review lab results for fatigue workup. CMP was normal, no abnormal electrolyte levels noted. CBC is wnl, no anemia noted. Vitamin B12 level is normal. TSH and free T4 were normal. She reports that she is taking vitamin supplements including B12. She has decreased her caffeine intake, She sleeps well fromapprox 1030-6am. She has 5 children at home and her husband. She is working from home which has alleviated some stress. Currently, she does not have any extraordinary stressors in her life. She has normal everyday stressors that accompany having a family and raising children. She denies any significant stress or strain on her marriage and reports that she has a supportive and healthy relationship with her husband. She reports feeling relieved that her lab results were wnl. She reports that the fatigue has improved some since she has started taking vitamin supplement including vitamin B12. She has eliminated caffeine from her diet. She has a significant history of environmental allergies. She has started taking her OTC allergy medications again as well as adding a dehumidifier in the house. She was previously on immunotherapy injections for her environmental allergies. She is interested in having allergy testing performed again so she can start the injections again. She has also tried allergy drops.  -History of ADHD, taking adderall xr daily plus 1 extra dose of short acting adderall as needed.   Current Medication: Outpatient Encounter Medications as of 10/20/2020  Medication Sig  . [START ON 11/19/2020] amphetamine-dextroamphetamine (ADDERALL XR) 20 MG 24 hr capsule Take 1 capsule (20 mg total)  by mouth every morning.  Melene Muller. [START ON 11/19/2020] amphetamine-dextroamphetamine (ADDERALL) 10 MG tablet Take 1 tablet (10 mg total) by mouth 2 (two) times daily. As needed for ADHD symptoms  . cetirizine (ZYRTEC) 10 MG tablet Take 1 tablet (10 mg total) by mouth daily.  . fluticasone (FLONASE) 50 MCG/ACT nasal spray Place 2 sprays into both nostrils daily.  Marland Kitchen. levonorgestrel (MIRENA) 20 MCG/24HR IUD 1 each by Intrauterine route once.  . loratadine (CLARITIN) 10 MG tablet Take 1 tablet (10 mg total) by mouth daily.  . Multiple Vitamin (MULTIVITAMIN) tablet Take 1 tablet by mouth daily.  . [DISCONTINUED] amphetamine-dextroamphetamine (ADDERALL XR) 20 MG 24 hr capsule Take 1 capsule (20 mg total) by mouth daily.  . [DISCONTINUED] amphetamine-dextroamphetamine (ADDERALL) 10 MG tablet Take 1 tablet (10 mg total) by mouth 2 (two) times daily.  Melene Muller. [START ON 12/19/2020] amphetamine-dextroamphetamine (ADDERALL XR) 20 MG 24 hr capsule Take 1 capsule (20 mg total) by mouth daily.  Melene Muller. [START ON 12/19/2020] amphetamine-dextroamphetamine (ADDERALL) 10 MG tablet Take 1 tablet (10 mg total) by mouth 2 (two) times daily. As needed for symptoms of ADHD  . [DISCONTINUED] amphetamine-dextroamphetamine (ADDERALL XR) 20 MG 24 hr capsule Take 1 capsule (20 mg total) by mouth daily.  . [DISCONTINUED] amphetamine-dextroamphetamine (ADDERALL XR) 20 MG 24 hr capsule Take 1 capsule (20 mg total) by mouth daily.  . [DISCONTINUED] amphetamine-dextroamphetamine (ADDERALL) 10 MG tablet Take 1 tablet (10 mg total) by mouth 2 (two) times daily.  . [DISCONTINUED] amphetamine-dextroamphetamine (ADDERALL) 10 MG tablet Take 1 tablet (10 mg total) by mouth 2 (two) times daily. As needed for symptoms of ADHD  No facility-administered encounter medications on file as of 10/20/2020.    Surgical History: Past Surgical History:  Procedure Laterality Date  . ABDOMINOPLASTY  2017  . COLPOSCOPY  09/21/2015  . WISDOM TOOTH EXTRACTION      Medical  History: Past Medical History:  Diagnosis Date  . ADHD   . Allergic rhinitis   . Fracture of clavicle with routine healing right  . History of abnormal cervical Pap smear 2017   LGSIL with HRHPV  . Sinusitis   . Wrist fracture, closed    left    Family History: Family History  Problem Relation Age of Onset  . Diabetes Mother   . Kidney disease Mother   . Allergies Father   . Breast cancer Maternal Aunt 50  . Diabetes Maternal Grandfather   . Hypertension Maternal Grandfather   . Cancer Sister     Social History   Socioeconomic History  . Marital status: Divorced    Spouse name: Not on file  . Number of children: 4  . Years of education: 73  . Highest education level: Not on file  Occupational History  . 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 Smoking status: Former Games developer  . Smokeless tobacco: Never Used  Vaping Use  . Vaping Use: Never used  Substance and Sexual Activity  . Alcohol use: Yes    Alcohol/week: 7.0 standard drinks    Types: 7 Glasses of wine per week    Comment: occasionally  . Drug use: Never  . Sexual activity: Yes    Birth control/protection: I.U.D.  Other Topics Concern  . Not on file  Social History Narrative  . Not on file   Social Determinants of Health   Financial Resource Strain: Not on file  Food Insecurity: Not on file  Transportation Needs: Not on file  Physical Activity: Not on file  Stress: Not on file  Social Connections: Not on file  Intimate Partner Violence: Not on file      Review of Systems  Constitutional: Negative for chills, fatigue and unexpected weight change.  HENT: Negative for congestion, rhinorrhea, sneezing and sore throat.   Eyes: Negative for redness.  Respiratory: Negative for cough, chest tightness and shortness of breath.   Cardiovascular: Negative for chest pain and palpitations.  Gastrointestinal: Negative for abdominal pain, constipation, diarrhea, nausea and vomiting.   Genitourinary: Negative for dysuria and frequency.  Musculoskeletal: Negative for arthralgias, back pain, joint swelling and neck pain.  Skin: Negative for rash.  Neurological: Negative.  Negative for tremors and numbness.  Hematological: Negative for adenopathy. Does not bruise/bleed easily.  Psychiatric/Behavioral: Negative for behavioral problems (Depression), sleep disturbance and suicidal ideas. The patient is not nervous/anxious.     Vital Signs: BP 116/82   Pulse 81   Temp 98.5 F (36.9 C)   Resp 16   Ht 5\' 4"  (1.626 m)   Wt 181 lb (82.1 kg)   SpO2 99%   BMI 31.07 kg/m    Physical Exam Vitals reviewed.  Constitutional:      General: She is not in acute distress.    Appearance: Normal appearance. She is well-developed. She is obese. She is not ill-appearing.  HENT:     Head: Normocephalic and atraumatic.  Neck:     Thyroid: No thyromegaly.     Vascular: No JVD.     Trachea: No tracheal deviation.  Cardiovascular:     Rate and Rhythm: Normal rate and regular rhythm.     Pulses:  Normal pulses.     Heart sounds: Normal heart sounds. No murmur heard. No friction rub. No gallop.   Pulmonary:     Effort: Pulmonary effort is normal. No respiratory distress.     Breath sounds: Normal breath sounds. No wheezing or rales.  Chest:     Chest wall: No tenderness.  Skin:    General: Skin is warm and dry.     Capillary Refill: Capillary refill takes less than 2 seconds.  Neurological:     Mental Status: She is alert and oriented to person, place, and time.     Cranial Nerves: No cranial nerve deficit.  Psychiatric:        Mood and Affect: Mood normal.        Behavior: Behavior normal.    Assessment/Plan: 1. Seasonal allergic rhinitis due to pollen History of environmental allergies, prior treatment with immunotherapy injections and drops. Patient requesting allergy testing to start immunotherapy injections again.  - Allergy Test; Future  2. Attention and  concentration deficit History of ADHD, current dose has been effective in controlling symptoms, refill for June, July and August ordered, will follow up in 3 months. She denies any side effects of the medication. She reports increased concentration while working from home.  - amphetamine-dextroamphetamine (ADDERALL XR) 20 MG 24 hr capsule; Take 1 capsule (20 mg total) by mouth daily.  Dispense: 30 capsule; Refill: 0 - amphetamine-dextroamphetamine (ADDERALL) 10 MG tablet; Take 1 tablet (10 mg total) by mouth 2 (two) times daily. As needed for symptoms of ADHD  Dispense: 60 tablet; Refill: 0 - amphetamine-dextroamphetamine (ADDERALL XR) 20 MG 24 hr capsule; Take 1 capsule (20 mg total) by mouth every morning.  Dispense: 30 capsule; Refill: 0 - amphetamine-dextroamphetamine (ADDERALL) 10 MG tablet; Take 1 tablet (10 mg total) by mouth 2 (two) times daily. As needed for ADHD symptoms  Dispense: 60 tablet; Refill: 0  3. Other fatigue Lab results were normal as discussed with patient. Fatigue has improved some with the addition of vitamin supplements especially B12. Will continue to monitor.    General Counseling: adison reifsteck understanding of the findings of todays visit and agrees with plan of treatment. I have discussed any further diagnostic evaluation that may be needed or ordered today. We also reviewed her medications today. she has been encouraged to call the office with any questions or concerns that should arise related to todays visit.    Orders Placed This Encounter  Procedures  . Allergy Test    Meds ordered this encounter  Medications  . DISCONTD: amphetamine-dextroamphetamine (ADDERALL XR) 20 MG 24 hr capsule    Sig: Take 1 capsule (20 mg total) by mouth daily.    Dispense:  30 capsule    Refill:  0    For june 2022    Order Specific Question:   Supervising Provider    Answer:   Lyndon Code [[id number]]  . DISCONTD: amphetamine-dextroamphetamine (ADDERALL) 10 MG tablet     Sig: Take 1 tablet (10 mg total) by mouth 2 (two) times daily.    Dispense:  60 tablet    Refill:  0    For June 2022    Order Specific Question:   Supervising Provider    Answer:   Lyndon Code [[id number]]  . DISCONTD: amphetamine-dextroamphetamine (ADDERALL) 10 MG tablet    Sig: Take 1 tablet (10 mg total) by mouth 2 (two) times daily. As needed for symptoms of ADHD    Dispense:  60 tablet  Refill:  0    For July 2022    Order Specific Question:   Supervising Provider    Answer:   Lyndon Code [[id number]]  . DISCONTD: amphetamine-dextroamphetamine (ADDERALL XR) 20 MG 24 hr capsule    Sig: Take 1 capsule (20 mg total) by mouth daily.    Dispense:  30 capsule    Refill:  0    For july 2022    Order Specific Question:   Supervising Provider    Answer:   Lyndon Code [[id number]]  . amphetamine-dextroamphetamine (ADDERALL XR) 20 MG 24 hr capsule    Sig: Take 1 capsule (20 mg total) by mouth daily.    Dispense:  30 capsule    Refill:  0    For August 2022    Order Specific Question:   Supervising Provider    Answer:   Lyndon Code [[id number]]  . amphetamine-dextroamphetamine (ADDERALL) 10 MG tablet    Sig: Take 1 tablet (10 mg total) by mouth 2 (two) times daily. As needed for symptoms of ADHD    Dispense:  60 tablet    Refill:  0    For August 2022    Order Specific Question:   Supervising Provider    Answer:   Lyndon Code [[id number]]  . amphetamine-dextroamphetamine (ADDERALL XR) 20 MG 24 hr capsule    Sig: Take 1 capsule (20 mg total) by mouth every morning.    Dispense:  30 capsule    Refill:  0    For July 2022    Order Specific Question:   Supervising Provider    Answer:   Lyndon Code [[id number]]  . amphetamine-dextroamphetamine (ADDERALL) 10 MG tablet    Sig: Take 1 tablet (10 mg total) by mouth 2 (two) times daily. As needed for ADHD symptoms    Dispense:  60 tablet    Refill:  0    For July 2022    Order Specific Question:   Supervising Provider    Answer:   Lyndon Code [[id number]]     Return in about 3 months (around 01/20/2021) for F/U, ADHD med check, Niesha Bame PCP, patient also needs to be schedule for allergy testing thank you.   Total time spent:30 Minutes Time spent includes review of chart, medications, test results, and follow up plan with the patient.   [place] Controlled Substance Database was reviewed by me.  This patient was seen by Sallyanne Kuster, FNP-C in collaboration with Dr. Beverely Risen as a part of collaborative care agreement.   Joseluis Alessio R. Tedd Sias, MSN, FNP-C Internal medicine

## 2020-10-22 MED ORDER — AMPHETAMINE-DEXTROAMPHET ER 20 MG PO CP24: 20.0000 mg | ORAL_CAPSULE | Freq: Every day | ORAL | 0 refills | Status: DC

## 2020-10-22 MED ORDER — AMPHETAMINE-DEXTROAMPHETAMINE 10 MG PO TABS: 10.0000 mg | ORAL_TABLET | Freq: Two times a day (BID) | ORAL | 0 refills | Status: DC

## 2020-10-22 MED ORDER — AMPHETAMINE-DEXTROAMPHET ER 20 MG PO CP24: 20.0000 mg | ORAL_CAPSULE | ORAL | 0 refills | Status: DC

## 2020-10-27 ENCOUNTER — Encounter: Payer: Self-pay | Admitting: Physician Assistant

## 2020-10-27 ENCOUNTER — Ambulatory Visit: Payer: 59 | Admitting: Physician Assistant

## 2020-10-27 ENCOUNTER — Other Ambulatory Visit: Payer: Self-pay

## 2020-10-27 VITALS — BP 113/73 | HR 100 | Temp 97.4°F | Resp 16 | Ht 64.0 in | Wt 182.2 lb

## 2020-10-27 DIAGNOSIS — J301 Allergic rhinitis due to pollen: Secondary | ICD-10-CM

## 2020-10-27 DIAGNOSIS — R21 Rash and other nonspecific skin eruption: Secondary | ICD-10-CM | POA: Diagnosis not present

## 2020-10-27 MED ORDER — DESONIDE 0.05 % EX CREA: TOPICAL_CREAM | Freq: Two times a day (BID) | CUTANEOUS | 0 refills | Status: DC

## 2020-10-27 MED ORDER — HYDROXYZINE PAMOATE 25 MG PO CAPS: 25.0000 mg | ORAL_CAPSULE | Freq: Three times a day (TID) | ORAL | 0 refills | Status: DC | PRN

## 2020-10-27 NOTE — Progress Notes (Signed)
[facility] [address] [place], [place] [address]  Internal MEDICINE  Office Visit Note  Patient Name: Jamie Meadows  [id number]  [id number]  Date of Service: 10/28/2020  Chief Complaint  Patient presents with   Acute Visit    Growing rash   Rash    Itchy, on top of breasts, across stomach, left upper thigh, behind right ear (painful), neck, and back, started Saturday, pt has tried home remedies and OTC meds and nothing is helping    Quality Metric Gaps    Pneumovax, shingrix     HPI Pt is here for a sick visit. -Rash started Saturday and had son's graduation party. Takes claritin in Am and zyrtec at night last week because she was outside. Passed out taking benedryl in past so has not tried it, but thinks she can take it just at a lower dose. -None of the places of the rash are painful, just very itchy. Places include right breast, behind right ear, back, abdomen, but main place whee it started is on Left thigh. The area on left thigh has a different appearance with some rough bumps. Tried gasoline on it in case it was poison ivy, also tried molluscum treatment and lotions, none of which helped. Tried taping the sites to prevent spread however this may be irritating skin further. -Not aware of any bites or contact with anything she is allergic to however sites appear consistent with hives  Current Medication:  Outpatient Encounter Medications as of 10/27/2020  Medication Sig   desonide (DESOWEN) 0.05 % cream Apply topically 2 (two) times daily.   hydrOXYzine (VISTARIL) 25 MG capsule Take 1 capsule (25 mg total) by mouth every 8 (eight) hours as needed.   [START ON 12/19/2020] amphetamine-dextroamphetamine (ADDERALL XR) 20 MG 24 hr capsule Take 1 capsule (20 mg total) by mouth daily.   [START ON 11/19/2020] amphetamine-dextroamphetamine (ADDERALL XR) 20 MG 24 hr capsule Take 1 capsule (20 mg total) by mouth every morning.   [START ON 12/19/2020]  amphetamine-dextroamphetamine (ADDERALL) 10 MG tablet Take 1 tablet (10 mg total) by mouth 2 (two) times daily. As needed for symptoms of ADHD   [START ON 11/19/2020] amphetamine-dextroamphetamine (ADDERALL) 10 MG tablet Take 1 tablet (10 mg total) by mouth 2 (two) times daily. As needed for ADHD symptoms   cetirizine (ZYRTEC) 10 MG tablet Take 1 tablet (10 mg total) by mouth daily.   fluticasone (FLONASE) 50 MCG/ACT nasal spray Place 2 sprays into both nostrils daily.   levonorgestrel (MIRENA) 20 MCG/24HR IUD 1 each by Intrauterine route once.   loratadine (CLARITIN) 10 MG tablet Take 1 tablet (10 mg total) by mouth daily.   Multiple Vitamin (MULTIVITAMIN) tablet Take 1 tablet by mouth daily.   No facility-administered encounter medications on file as of 10/27/2020.      Medical History: Past Medical History:  Diagnosis Date   ADHD    Allergic rhinitis    Fracture of clavicle with routine healing right   History of abnormal cervical Pap smear 2017   LGSIL with HRHPV   Sinusitis    Wrist fracture, closed    left     Vital Signs: BP 113/73   Pulse 100   Temp (!) 97.4 F (36.3 C)   Resp 16   Ht 5\' 4"  (1.626 m)   Wt 182 lb 3.2 oz (82.6 kg)   SpO2 99%   BMI 31.27 kg/m    Review of Systems  Constitutional:  Negative for fatigue and  fever.  HENT:  Negative for congestion, mouth sores and postnasal drip.   Respiratory:  Negative for cough.   Cardiovascular:  Negative for chest pain and palpitations.  Genitourinary:  Negative for flank pain.  Musculoskeletal:  Negative for myalgias.  Skin:  Positive for rash.       Rash started on Left thigh, then right breast, 1 or 2 spots on abdomen and back, and then behind right ear which is the only place that is a little painful rather than itchy. All other sites itch badly.  Allergic/Immunologic: Positive for environmental allergies.  Psychiatric/Behavioral: Negative.     Physical Exam Vitals and nursing note reviewed.   Constitutional:      General: She is not in acute distress.    Appearance: She is well-developed. She is obese. She is not diaphoretic.  HENT:     Head: Normocephalic and atraumatic.     Mouth/Throat:     Pharynx: No oropharyngeal exudate.  Eyes:     Pupils: Pupils are equal, round, and reactive to light.  Neck:     Thyroid: No thyromegaly.     Vascular: No JVD.     Trachea: No tracheal deviation.  Cardiovascular:     Rate and Rhythm: Normal rate and regular rhythm.     Heart sounds: Normal heart sounds. No murmur heard.   No friction rub. No gallop.  Pulmonary:     Effort: Pulmonary effort is normal. No respiratory distress.     Breath sounds: No wheezing or rales.  Chest:     Chest wall: No tenderness.  Abdominal:     General: Bowel sounds are normal.     Palpations: Abdomen is soft.  Musculoskeletal:        General: Normal range of motion.     Cervical back: Normal range of motion and neck supple.  Lymphadenopathy:     Cervical: No cervical adenopathy.  Skin:    General: Skin is warm and dry.     Findings: Rash present.     Comments: Erythematous rash with small papules on left thigh. Larger macular erythematous rash on right breast up toward shoulder, Small rashes consistent with hives on abdomen and back. Erythematous area behind r eye with minimal swelling of auricle.No bite marks/punctures     Neurological:     Mental Status: She is alert and oriented to person, place, and time.     Cranial Nerves: No cranial nerve deficit.  Psychiatric:        Behavior: Behavior normal.        Thought Content: Thought content normal.        Judgment: Judgment normal.      Assessment/Plan: 1. Rash of body May take hydroxyzine as needed for itching. May use desonide cream on area behind ear and rash along face. May use hydrocortisone cream on other areas. Depo shot given in office due to extent of rash. Pt will call if no improvement orr worsening. - hydrOXYzine (VISTARIL) 25  MG capsule; Take 1 capsule (25 mg total) by mouth every 8 (eight) hours as needed.  Dispense: 30 capsule; Refill: 0 - desonide (DESOWEN) 0.05 % cream; Apply topically 2 (two) times daily.  Dispense: 30 g; Refill: 0  2. Seasonal allergic rhinitis due to pollen Continue allergy medications.   General Counseling: nene aranas understanding of the findings of todays visit and agrees with plan of treatment. I have discussed any further diagnostic evaluation that may be needed or ordered today. We also reviewed  her medications today. she has been encouraged to call the office with any questions or concerns that should arise related to todays visit.    Counseling:    No orders of the defined types were placed in this encounter.   Meds ordered this encounter  Medications   hydrOXYzine (VISTARIL) 25 MG capsule    Sig: Take 1 capsule (25 mg total) by mouth every 8 (eight) hours as needed.    Dispense:  30 capsule    Refill:  0   desonide (DESOWEN) 0.05 % cream    Sig: Apply topically 2 (two) times daily.    Dispense:  30 g    Refill:  0    Time spent:30 Minutes

## 2020-10-31 ENCOUNTER — Other Ambulatory Visit: Payer: Self-pay

## 2020-10-31 DIAGNOSIS — R21 Rash and other nonspecific skin eruption: Secondary | ICD-10-CM | POA: Diagnosis not present

## 2020-10-31 MED ORDER — PREDNISONE 10 MG PO TABS: ORAL_TABLET | ORAL | 0 refills | Status: DC

## 2020-10-31 MED ORDER — METHYLPREDNISOLONE ACETATE 80 MG/ML IJ SUSP
80.0000 mg | Freq: Once | INTRAMUSCULAR | Status: AC
  Administered 2020-10-31: 40 mg via INTRAMUSCULAR

## 2020-11-30 ENCOUNTER — Ambulatory Visit
Admission: EM | Admit: 2020-11-30 | Discharge: 2020-11-30 | Disposition: A | Discharge status: Home / Self Care | Payer: 59 | Attending: Emergency Medicine | Admitting: Emergency Medicine

## 2020-11-30 ENCOUNTER — Encounter: Payer: Self-pay | Admitting: Emergency Medicine

## 2020-11-30 ENCOUNTER — Other Ambulatory Visit: Payer: Self-pay

## 2020-11-30 DIAGNOSIS — J029 Acute pharyngitis, unspecified: Secondary | ICD-10-CM | POA: Diagnosis not present

## 2020-11-30 DIAGNOSIS — H60502 Unspecified acute noninfective otitis externa, left ear: Secondary | ICD-10-CM | POA: Diagnosis not present

## 2020-11-30 LAB — POCT RAPID STREP A (OFFICE): Rapid Strep A Screen: NEGATIVE

## 2020-11-30 MED ORDER — NEOMYCIN-POLYMYXIN-HC 3.5-10000-1 OT SUSP: 4.0000 [drp] | Freq: Three times a day (TID) | OTIC | 0 refills | Status: DC

## 2020-11-30 NOTE — ED Provider Notes (Signed)
Renaldo Fiddler    CSN: [id number] Arrival date & time: 11/30/20  0854      History   Chief Complaint Chief Complaint  Patient presents with   Otalgia   Sore Throat    HPI Jamie Meadows is a 45 y.o. female.  Patient presents with 3-day history of ear pain and sore throat.  She denies ear drainage, change in hearing, fever, chills, rash, cough, shortness of breath, or other symptoms.  Treatment attempted at home with ibuprofen.  Patient reports negative at-home COVID test.  Her medical history includes chronic allergic rhinitis, seasonal allergies, sinusitis.  The history is provided by the patient and medical records.   Past Medical History:  Diagnosis Date   ADHD    Allergic rhinitis    Fracture of clavicle with routine healing right   History of abnormal cervical Pap smear 2017   LGSIL with HRHPV   Sinusitis    Wrist fracture, closed    left    Patient Active Problem List   Diagnosis Date Noted   Acute non-recurrent frontal sinusitis 05/26/2020   Personal history of COVID-19 05/26/2020   Recurrent sinusitis 06/04/2018   Seasonal allergic rhinitis due to pollen 10/28/2017   Encounter for long-term (current) use of medications 09/13/2017   ASCUS with positive high risk HPV cervical 09/13/2017   LGSIL on Pap smear of cervix 09/05/2017   Attention and concentration deficit 07/30/2017   Candidiasis 07/30/2017   Chronic allergic rhinitis 07/30/2017    Past Surgical History:  Procedure Laterality Date   ABDOMINOPLASTY  2017   COLPOSCOPY  09/21/2015   WISDOM TOOTH EXTRACTION      OB History     Gravida  5   Para  4   Term  4   Preterm      AB  1   Living  4      SAB  1   IAB      Ectopic      Multiple      Live Births  4            Home Medications    Prior to Admission medications   Medication Sig Start Date End Date Taking? Authorizing Provider  amphetamine-dextroamphetamine (ADDERALL XR) 20 MG 24 hr capsule Take 1  capsule (20 mg total) by mouth daily. 12/19/20  Yes Abernathy, Arlyss Repress, NP  cetirizine (ZYRTEC) 10 MG tablet Take 1 tablet (10 mg total) by mouth daily. 09/21/19  Yes Boscia, Kathlynn Grate, NP  neomycin-polymyxin-hydrocortisone (CORTISPORIN) 3.5-10000-1 OTIC suspension Place 4 drops into the left ear 3 (three) times daily. 11/30/20  Yes Mickie Bail, NP  amphetamine-dextroamphetamine (ADDERALL XR) 20 MG 24 hr capsule Take 1 capsule (20 mg total) by mouth every morning. 11/19/20   Sallyanne Kuster, NP  amphetamine-dextroamphetamine (ADDERALL) 10 MG tablet Take 1 tablet (10 mg total) by mouth 2 (two) times daily. As needed for symptoms of ADHD 12/19/20   Sallyanne Kuster, NP  amphetamine-dextroamphetamine (ADDERALL) 10 MG tablet Take 1 tablet (10 mg total) by mouth 2 (two) times daily. As needed for ADHD symptoms 11/19/20   Sallyanne Kuster, NP  desonide (DESOWEN) 0.05 % cream Apply topically 2 (two) times daily. 10/27/20   McDonough, Lauren K, PA-C  fluticasone (FLONASE) 50 MCG/ACT nasal spray Place 2 sprays into both nostrils daily. 09/28/20   Lyndon Code, MD  hydrOXYzine (VISTARIL) 25 MG capsule Take 1 capsule (25 mg total) by mouth every 8 (eight) hours as needed. 10/27/20  McDonough, Salomon Fick, PA-C  levonorgestrel (MIRENA) 20 MCG/24HR IUD 1 each by Intrauterine route once.    [provider]  loratadine (CLARITIN) 10 MG tablet Take 1 tablet (10 mg total) by mouth daily. 05/30/18   Carlean Jews, NP  Multiple Vitamin (MULTIVITAMIN) tablet Take 1 tablet by mouth daily.    [provider]  predniSONE (DELTASONE) 10 MG tablet Use as directed for 6 days 10/31/20   Lyndon Code, MD    Family History Family History  Problem Relation Age of Onset   Diabetes Mother    Kidney disease Mother    Allergies Father    Breast cancer Maternal Aunt 25   Diabetes Maternal Grandfather    Hypertension Maternal Grandfather    Cancer Sister     Social History Social History   Tobacco Use   Smoking  status: Former    Pack years: 0.00   Smokeless tobacco: Never  Vaping Use   Vaping Use: Never used  Substance Use Topics   Alcohol use: Yes    Alcohol/week: 7.0 standard drinks    Types: 7 Glasses of wine per week    Comment: occasionally   Drug use: Never     Allergies   Patient has no known allergies.   Review of Systems Review of Systems  Constitutional:  Negative for chills and fever.  HENT:  Positive for ear pain and sore throat. Negative for ear discharge.   Respiratory:  Negative for cough and shortness of breath.   Cardiovascular:  Negative for chest pain and palpitations.  Gastrointestinal:  Negative for abdominal pain and vomiting.  Skin:  Negative for color change and rash.  All other systems reviewed and are negative.   Physical Exam Triage Vital Signs ED Triage Vitals  Enc Vitals Group     BP      Pulse      Resp      Temp      Temp src      SpO2      Weight      Height      Head Circumference      Peak Flow      Pain Score      Pain Loc      Pain Edu?      Excl. in GC?    No data found.  Updated Vital Signs BP 123/88 (BP Location: Left Arm)   Pulse 93   Temp 97.9 F (36.6 C) (Oral)   Resp 18   LMP  (LMP Unknown)   SpO2 98%   Visual Acuity Right Eye Distance:   Left Eye Distance:   Bilateral Distance:    Right Eye Near:   Left Eye Near:    Bilateral Near:     Physical Exam Vitals and nursing note reviewed.  Constitutional:      General: She is not in acute distress.    Appearance: She is well-developed.  HENT:     Head: Normocephalic and atraumatic.     Right Ear: Tympanic membrane and ear canal normal.     Left Ear: Tympanic membrane normal.     Ears:     Comments: Left ear canal inflamed.  No drainage.  Right ear canal normal.    Nose: Nose normal.     Mouth/Throat:     Mouth: Mucous membranes are moist.     Pharynx: Oropharynx is clear.  Eyes:     Conjunctiva/sclera: Conjunctivae normal.  Cardiovascular:  Rate  and Rhythm: Normal rate and regular rhythm.     Heart sounds: Normal heart sounds.  Pulmonary:     Effort: Pulmonary effort is normal. No respiratory distress.     Breath sounds: Normal breath sounds.  Abdominal:     Palpations: Abdomen is soft.     Tenderness: There is no abdominal tenderness.  Musculoskeletal:     Cervical back: Neck supple.  Skin:    General: Skin is warm and dry.  Neurological:     General: No focal deficit present.     Mental Status: She is alert and oriented to person, place, and time.     Gait: Gait normal.  Psychiatric:        Mood and Affect: Mood normal.        Behavior: Behavior normal.     UC Treatments / Results  Labs (all labs ordered are listed, but only abnormal results are displayed) Labs Reviewed  POCT RAPID STREP A (OFFICE)    EKG   Radiology No results found.  Procedures Procedures (including critical care time)  Medications Ordered in UC Medications - No data to display  Initial Impression / Assessment and Plan / UC Course  I have reviewed the triage vital signs and the nursing notes.  Pertinent labs & imaging results that were available during my care of the patient were reviewed by me and considered in my medical decision making (see chart for details).  Otitis externa, sore throat.  Treating with Cortisporin eardrops.  Rapid strep negative.  Instructed patient to take Tylenol or ibuprofen as needed for discomfort.  Instructed her to follow-up with her PCP if her symptoms are not improving.  She agrees to plan of care.   Final Clinical Impressions(s) / UC Diagnoses   Final diagnoses:  Acute otitis externa of left ear, unspecified type  Sore throat     Discharge Instructions      Use the eardrops as directed.    Your rapid strep test is negative.      Follow up with your primary care provider if your symptoms are not improving.        ED Prescriptions     Medication Sig Dispense Auth. Provider    neomycin-polymyxin-hydrocortisone (CORTISPORIN) 3.5-10000-1 OTIC suspension Place 4 drops into the left ear 3 (three) times daily. 10 mL Mickie Bail, NP      PDMP not reviewed this encounter.   Mickie Bail, NP 11/30/20 1010

## 2020-11-30 NOTE — ED Triage Notes (Signed)
Patient c/o LFT sided ear pain and RT sided sore throat x 3 days.   Patient denies fever at home. Patient denies hearing changes.   Patient endorses " outside of ear is sensitive to touch".  Patient endorses " my throat feels irritated when I swallow and I see some redness there".   Patient took an at home COVID test and reports negative test results.   Patient has taken ibuprofen with no relief of symptoms.

## 2020-11-30 NOTE — Discharge Instructions (Addendum)
Use the eardrops as directed.    Your rapid strep test is negative.      Follow up with your primary care provider if your symptoms are not improving.

## 2020-12-01 ENCOUNTER — Ambulatory Visit: Payer: 59 | Admitting: Internal Medicine

## 2020-12-12 ENCOUNTER — Other Ambulatory Visit: Payer: Self-pay | Admitting: Internal Medicine

## 2020-12-12 ENCOUNTER — Other Ambulatory Visit: Payer: Self-pay

## 2020-12-12 ENCOUNTER — Other Ambulatory Visit: Payer: Self-pay | Admitting: Nurse Practitioner

## 2020-12-12 ENCOUNTER — Ambulatory Visit: Payer: 59 | Admitting: Internal Medicine

## 2020-12-12 VITALS — BP 100/78 | HR 88 | Temp 98.6°F | Resp 16 | Ht 64.0 in | Wt 187.8 lb

## 2020-12-12 DIAGNOSIS — J301 Allergic rhinitis due to pollen: Secondary | ICD-10-CM | POA: Diagnosis not present

## 2020-12-12 DIAGNOSIS — Z1231 Encounter for screening mammogram for malignant neoplasm of breast: Secondary | ICD-10-CM

## 2020-12-12 NOTE — Procedures (Signed)
    [facility] Allergy MQT Recording Form  [facility] [address] [place], [place] [address] Phone [phone] Fax [phone]   Patient Name: Jamie Meadows Age: 45 y.o. Sex: female Date of Service: @SERVICEDATE @   Performing Provider: MD [facility]         Battery A Back   Site Antigen [facility] Flare  A1 Positive Control 10 30  A2 Negative Control 4 5  A3 American Elm 00 0  A4 Maple Box Elder 0 0  A5 Grass Mix 10 40  A6 Dock Sorrel Mix 0 0  A7 Russian Thistle 0 0  A8 Ragweed Mix 0 0  A9 English Pantain 0 0  A10 Oak Mix  7 15   Battery B Wheal Flare  B1 Lambs Quarters 0 0  B2 Cottonwood 0 0  B3 Pigweed Mix 0 0  B4 Acacia 0 0  B5 Pine Mix 7 10  B6 Privet 9 20  B7 White/Red Mulberry 0 0  B8 Western Water Hemp 0 0  B9 [facility] Grass 4 10  B10 Melalucea 7 9   Battery C Wheal Flare  C1 Red River Birch 10 15  C2 Eastern Sycamore 9 11  C3 Bahai Grass 8 10  C4 American Beech 0 0  C5 Ash Mix 10 30  C6 Black Willow 0 0  C7 Hickory 7 9  C8 Black Walnut 0 0  C9 Red Cedar 9 11  C10 Sweet Gum  0 0   Battery D Wheal Flare  D1 Cultivated Oat 10 30  D2 Dog Fennel 0 0  D3 Common Mugwort 0 0  D4 Marsh Elder 0 0  D5 Johnson 9 15  D6 Hackberry Tree 9 12  D7 Bayberry Tree 0 0  D8 Cypress, Bald Tree 0 0  D9 Aspergillus Fumigatus 0 0  D10 Alternia  7 10   Battery E Wheal Flare  E1 Dreschlere 7 20  E2 Fusarium Mix 7 14  E3 Cladosporum Sph 5 13  E4 Bipolaris 7 11  E5 Penicillin chrys 5 7  E6 Cladosporum Herb 0 0  E7 Candida 0 0  E8 Aureobasidium 0 0  E9 Rhizopus 0 0  E10 Botrytis  7 13   Battery F Wheal Flare  F1 Aspergillus [place] 0 0  F2 Dust Mite Mix 22 40  F3 Cockroach Mix 0 0  F4 Cat Hair 0 0  F5 Dog Mixed breeds 0 0  F6 Feather Mix 0 0

## 2020-12-20 ENCOUNTER — Ambulatory Visit
Admission: RE | Admit: 2020-12-20 | Discharge: 2020-12-20 | Disposition: A | Discharge status: Home / Self Care | Payer: 59 | Source: Ambulatory Visit | Attending: Internal Medicine | Admitting: Internal Medicine

## 2020-12-20 ENCOUNTER — Other Ambulatory Visit: Payer: Self-pay

## 2020-12-20 DIAGNOSIS — Z1231 Encounter for screening mammogram for malignant neoplasm of breast: Secondary | ICD-10-CM | POA: Insufficient documentation

## 2020-12-21 ENCOUNTER — Other Ambulatory Visit: Payer: Self-pay | Admitting: Nurse Practitioner

## 2020-12-21 DIAGNOSIS — J309 Allergic rhinitis, unspecified: Secondary | ICD-10-CM

## 2020-12-22 ENCOUNTER — Other Ambulatory Visit: Payer: Self-pay | Admitting: Internal Medicine

## 2020-12-22 DIAGNOSIS — J301 Allergic rhinitis due to pollen: Secondary | ICD-10-CM

## 2020-12-29 ENCOUNTER — Ambulatory Visit: Payer: Self-pay | Admitting: Dermatology

## 2021-01-08 ENCOUNTER — Encounter: Payer: Self-pay | Admitting: Internal Medicine

## 2021-01-19 ENCOUNTER — Telehealth: Payer: Self-pay

## 2021-01-19 ENCOUNTER — Ambulatory Visit: Payer: 59 | Admitting: Nurse Practitioner

## 2021-01-19 ENCOUNTER — Encounter: Payer: Self-pay | Admitting: Nurse Practitioner

## 2021-01-19 ENCOUNTER — Other Ambulatory Visit: Payer: Self-pay

## 2021-01-19 VITALS — BP 110/82 | HR 85 | Temp 98.2°F | Resp 16 | Ht 64.0 in | Wt 187.8 lb

## 2021-01-19 DIAGNOSIS — Z79899 Other long term (current) drug therapy: Secondary | ICD-10-CM | POA: Diagnosis not present

## 2021-01-19 DIAGNOSIS — J309 Allergic rhinitis, unspecified: Secondary | ICD-10-CM | POA: Diagnosis not present

## 2021-01-19 DIAGNOSIS — R41840 Attention and concentration deficit: Secondary | ICD-10-CM | POA: Diagnosis not present

## 2021-01-19 LAB — POCT URINE DRUG SCREEN
Methylenedioxyamphetamine: NOT DETECTED
POC Amphetamine UR: POSITIVE — AB
POC BENZODIAZEPINES UR: NOT DETECTED
POC Barbiturate UR: NOT DETECTED
POC Cocaine UR: NOT DETECTED
POC Ecstasy UR: NOT DETECTED
POC Marijuana UR: NOT DETECTED
POC Methadone UR: NOT DETECTED
POC Methamphetamine UR: NOT DETECTED
POC Opiate Ur: NOT DETECTED
POC Oxycodone UR: NOT DETECTED
POC PHENCYCLIDINE UR: NOT DETECTED
POC TRICYCLICS UR: NOT DETECTED

## 2021-01-19 MED ORDER — AMPHETAMINE-DEXTROAMPHET ER 20 MG PO CP24: 20.0000 mg | ORAL_CAPSULE | Freq: Every day | ORAL | 0 refills | Status: DC

## 2021-01-19 MED ORDER — CETIRIZINE HCL 10 MG PO TABS: 10.0000 mg | ORAL_TABLET | Freq: Every day | ORAL | 3 refills | Status: DC

## 2021-01-19 MED ORDER — AMPHETAMINE-DEXTROAMPHETAMINE 10 MG PO TABS: 10.0000 mg | ORAL_TABLET | Freq: Two times a day (BID) | ORAL | 0 refills | Status: DC

## 2021-01-19 NOTE — Telephone Encounter (Signed)
Patient states she will call back if she's interested in proceeding with allergy injections.tat

## 2021-01-19 NOTE — Telephone Encounter (Signed)
Patient advised that she will be responsible $16.00 for each allergy injection until she has meet her $6950 oop max/ $978.54 met.

## 2021-01-19 NOTE — Telephone Encounter (Signed)
Information given to Tat to file insurance for pt to start allergy injections.  Prescription for 2 vials

## 2021-01-19 NOTE — Progress Notes (Signed)
Jamie Meadows [address] [place], [place] [address]  Internal MEDICINE  Office Visit Note  Patient Name: Jamie Meadows  [id number]  [id number]  Date of Service: 01/19/2021  Chief Complaint  Patient presents with   Follow-up    Refills, allergy shots    HPI [name] presents for a follow up visit for ADHD, medication refills and to discuss allergy shots. She had allergy testing done in July and was informed that she would need allergic immunotherapy injections. She is waiting to find out about her insurance coverage for this and would like an update. UDS is due today, patient has ADHD and takes Adderall to help with concentration and attention. She denies any adverse side effects of the medications and reprots that the current medication and dose remains effective. Her heart rate and blood pressure are within normal limits.    Current Medication: Outpatient Encounter Medications as of 01/19/2021  Medication Sig   fluticasone (FLONASE) 50 MCG/ACT nasal spray SHAKE LIQUID AND USE 2 SPRAYS IN EACH NOSTRIL DAILY   levonorgestrel (MIRENA) 20 MCG/24HR IUD 1 each by Intrauterine route once.   Multiple Vitamin (MULTIVITAMIN) tablet Take 1 tablet by mouth daily.   [DISCONTINUED] amphetamine-dextroamphetamine (ADDERALL XR) 20 MG 24 hr capsule Take 1 capsule (20 mg total) by mouth daily.   [DISCONTINUED] amphetamine-dextroamphetamine (ADDERALL) 10 MG tablet Take 1 tablet (10 mg total) by mouth 2 (two) times daily. As needed for symptoms of ADHD   [DISCONTINUED] cetirizine (ZYRTEC) 10 MG tablet Take 1 tablet (10 mg total) by mouth daily.   [DISCONTINUED] loratadine (CLARITIN) 10 MG tablet Take 1 tablet (10 mg total) by mouth daily.   amphetamine-dextroamphetamine (ADDERALL XR) 20 MG 24 hr capsule Take 1 capsule (20 mg total) by mouth daily.   [START ON 02/17/2021] amphetamine-dextroamphetamine (ADDERALL XR) 20 MG 24 hr capsule Take 1 capsule (20 mg total) by mouth daily.   [START ON  03/17/2021] amphetamine-dextroamphetamine (ADDERALL XR) 20 MG 24 hr capsule Take 1 capsule (20 mg total) by mouth daily.   amphetamine-dextroamphetamine (ADDERALL) 10 MG tablet Take 1 tablet (10 mg total) by mouth 2 (two) times daily. As needed for symptoms of ADHD   [START ON 02/17/2021] amphetamine-dextroamphetamine (ADDERALL) 10 MG tablet Take 1 tablet (10 mg total) by mouth 2 (two) times daily. As needed for symptoms of ADHD   [START ON 03/17/2021] amphetamine-dextroamphetamine (ADDERALL) 10 MG tablet Take 1 tablet (10 mg total) by mouth 2 (two) times daily. As needed for symptoms of ADHD   cetirizine (ZYRTEC) 10 MG tablet Take 1 tablet (10 mg total) by mouth daily.   [DISCONTINUED] amphetamine-dextroamphetamine (ADDERALL XR) 20 MG 24 hr capsule Take 1 capsule (20 mg total) by mouth every morning. (Patient not taking: Reported on 01/19/2021)   [DISCONTINUED] amphetamine-dextroamphetamine (ADDERALL) 10 MG tablet Take 1 tablet (10 mg total) by mouth 2 (two) times daily. As needed for ADHD symptoms (Patient not taking: Reported on 01/19/2021)   No facility-administered encounter medications on file as of 01/19/2021.    Surgical History: Past Surgical History:  Procedure Laterality Date   ABDOMINOPLASTY  2017   COLPOSCOPY  09/21/2015   WISDOM TOOTH EXTRACTION      Medical History: Past Medical History:  Diagnosis Date   ADHD    Allergic rhinitis    Fracture of clavicle with routine healing right   History of abnormal cervical Pap smear 2017   LGSIL with HRHPV   Sinusitis    Wrist fracture, closed    left  Family History: Family History  Problem Relation Age of Onset   Diabetes Mother    Kidney disease Mother    Allergies Father    Breast cancer Maternal Aunt [phone]   Diabetes Maternal Grandfather    Hypertension Maternal Grandfather    Cancer Sister     Social History   Socioeconomic History   Marital status: Divorced    Spouse name: Not on file   Number of children: 4    Years of education: 16   Highest education level: Not on file  Occupational History   Occupation: self [id number]-[employer]  Tobacco Use   Smoking status: Former   Smokeless tobacco: Never  Building services engineerVaping Use   Vaping Use: Never used  Substance and Sexual Activity   Alcohol use: Yes    Alcohol/week: 7.0 standard drinks    Types: 7 Glasses of wine per week    Comment: occasionally   Drug use: Never   Sexual activity: Yes    Birth control/protection: I.U.D.  Other Topics Concern   Not on file  Social History Narrative   Not on file   Social Determinants of Health   Financial Resource Strain: Not on file  Food Insecurity: Not on file  Transportation Needs: Not on file  Physical Activity: Not on file  Stress: Not on file  Social Connections: Not on file  Intimate Partner Violence: Not on file      Review of Systems  Constitutional:  Negative for chills, fatigue and unexpected weight change.  HENT:  Positive for congestion and postnasal drip. Negative for rhinorrhea, sneezing and sore throat.        Allergic rhinitis, takes cetirizine  Eyes:  Negative for redness.  Respiratory:  Negative for cough, chest tightness and shortness of breath.   Cardiovascular:  Negative for chest pain and palpitations.  Gastrointestinal:  Negative for abdominal pain, constipation, diarrhea, nausea and vomiting.  Genitourinary:  Negative for dysuria and frequency.  Musculoskeletal:  Negative for arthralgias, back pain, joint swelling and neck pain.  Skin:  Negative for rash.  Neurological: Negative.  Negative for tremors and numbness.  Hematological:  Negative for adenopathy. Does not bruise/bleed easily.  Psychiatric/Behavioral:  Negative for behavioral problems (Depression), sleep disturbance and suicidal ideas. The patient is not nervous/anxious.    Vital Signs: BP 110/82   Pulse 85   Temp 98.2 F (36.8 C)   Resp 16   Ht 5\' 4"  (1.626 m)   Wt 187 lb 12.8 oz (85.2 kg)   SpO2 98%   BMI 32.24  kg/m    Physical Exam Vitals reviewed.  Constitutional:      General: She is not in acute distress.    Appearance: Normal appearance. She is obese. She is not ill-appearing.  HENT:     Head: Normocephalic and atraumatic.  Eyes:     Extraocular Movements: Extraocular movements intact.     Pupils: Pupils are equal, round, and reactive to light.  Cardiovascular:     Rate and Rhythm: Normal rate and regular rhythm.     Heart sounds: Normal heart sounds. No murmur heard.   No friction rub. No gallop.  Pulmonary:     Effort: Pulmonary effort is normal. No respiratory distress.     Breath sounds: Normal breath sounds. No stridor. No wheezing, rhonchi or rales.  Neurological:     Mental Status: She is alert and oriented to person, place, and time.  Psychiatric:        Mood and Affect: Mood  normal.        Behavior: Behavior normal.     Assessment/Plan: 1. Encounter for long-term (current) use of medications UDS done today.  - POCT Urine Drug Screen  2. Chronic allergic rhinitis Cetirizine refilled, patient waiting for update regarding immunotherapy injections, staff notified to call patient with update.  - cetirizine (ZYRTEC) 10 MG tablet; Take 1 tablet (10 mg total) by mouth daily.  Dispense: 90 tablet; Refill: 3  3. Attention and concentration deficit Continue current medication as prescribed, refills sent to pharmacy.  - amphetamine-dextroamphetamine (ADDERALL) 10 MG tablet; Take 1 tablet (10 mg total) by mouth 2 (two) times daily. As needed for symptoms of ADHD  Dispense: 60 tablet; Refill: 0 - amphetamine-dextroamphetamine (ADDERALL XR) 20 MG 24 hr capsule; Take 1 capsule (20 mg total) by mouth daily.  Dispense: 30 capsule; Refill: 0 - amphetamine-dextroamphetamine (ADDERALL) 10 MG tablet; Take 1 tablet (10 mg total) by mouth 2 (two) times daily. As needed for symptoms of ADHD  Dispense: 60 tablet; Refill: 0 - amphetamine-dextroamphetamine (ADDERALL) 10 MG tablet; Take 1 tablet  (10 mg total) by mouth 2 (two) times daily. As needed for symptoms of ADHD  Dispense: 60 tablet; Refill: 0 - amphetamine-dextroamphetamine (ADDERALL XR) 20 MG 24 hr capsule; Take 1 capsule (20 mg total) by mouth daily.  Dispense: 30 capsule; Refill: 0 - amphetamine-dextroamphetamine (ADDERALL XR) 20 MG 24 hr capsule; Take 1 capsule (20 mg total) by mouth daily.  Dispense: 30 capsule; Refill: 0   General Counseling: Krisy verbalizes understanding of the findings of todays visit and agrees with plan of treatment. I have discussed any further diagnostic evaluation that may be needed or ordered today. We also reviewed her medications today. she has been encouraged to call the office with any questions or concerns that should arise related to todays visit.    Orders Placed This Encounter  Procedures   POCT Urine Drug Screen    Meds ordered this encounter  Medications   cetirizine (ZYRTEC) 10 MG tablet    Sig: Take 1 tablet (10 mg total) by mouth daily.    Dispense:  90 tablet    Refill:  3    Please d/c loratidine   amphetamine-dextroamphetamine (ADDERALL) 10 MG tablet    Sig: Take 1 tablet (10 mg total) by mouth 2 (two) times daily. As needed for symptoms of ADHD    Dispense:  60 tablet    Refill:  0    For September 2022   amphetamine-dextroamphetamine (ADDERALL XR) 20 MG 24 hr capsule    Sig: Take 1 capsule (20 mg total) by mouth daily.    Dispense:  30 capsule    Refill:  0    For September 2022   amphetamine-dextroamphetamine (ADDERALL) 10 MG tablet    Sig: Take 1 tablet (10 mg total) by mouth 2 (two) times daily. As needed for symptoms of ADHD    Dispense:  60 tablet    Refill:  0    For 02/17/21   amphetamine-dextroamphetamine (ADDERALL) 10 MG tablet    Sig: Take 1 tablet (10 mg total) by mouth 2 (two) times daily. As needed for symptoms of ADHD    Dispense:  60 tablet    Refill:  0    For 03/17/21   amphetamine-dextroamphetamine (ADDERALL XR) 20 MG 24 hr capsule    Sig:  Take 1 capsule (20 mg total) by mouth daily.    Dispense:  30 capsule    Refill:  0  For 02/17/21   amphetamine-dextroamphetamine (ADDERALL XR) 20 MG 24 hr capsule    Sig: Take 1 capsule (20 mg total) by mouth daily.    Dispense:  30 capsule    Refill:  0    For 03/17/21    Return in about 3 months (around 04/20/2021) for F/U, ADHD med check, Mahad Newstrom PCP.   Total time spent:30 Minutes Time spent includes review of chart, medications, test results, and follow up plan with the patient.   [place] Controlled Substance Database was reviewed by me.  This patient was seen by Sallyanne Kuster, FNP-C in collaboration with Dr. Beverely Risen as a part of collaborative care agreement.   Josanne Boerema R. Tedd Sias, MSN, FNP-C Internal medicine

## 2021-01-31 ENCOUNTER — Ambulatory Visit: Payer: Self-pay | Admitting: Dermatology

## 2021-03-01 ENCOUNTER — Other Ambulatory Visit: Payer: Self-pay

## 2021-03-01 ENCOUNTER — Ambulatory Visit (INDEPENDENT_AMBULATORY_CARE_PROVIDER_SITE_OTHER): Payer: 59 | Admitting: Nurse Practitioner

## 2021-03-01 ENCOUNTER — Encounter: Payer: Self-pay | Admitting: Nurse Practitioner

## 2021-03-01 VITALS — BP 94/80 | HR 95 | Temp 98.5°F | Resp 16 | Ht 64.0 in | Wt 179.6 lb

## 2021-03-01 DIAGNOSIS — Z0001 Encounter for general adult medical examination with abnormal findings: Secondary | ICD-10-CM | POA: Diagnosis not present

## 2021-03-01 DIAGNOSIS — R41840 Attention and concentration deficit: Secondary | ICD-10-CM | POA: Diagnosis not present

## 2021-03-01 DIAGNOSIS — E6609 Other obesity due to excess calories: Secondary | ICD-10-CM

## 2021-03-01 DIAGNOSIS — R300 Dysuria: Secondary | ICD-10-CM | POA: Diagnosis not present

## 2021-03-01 DIAGNOSIS — Z6830 Body mass index (BMI) 30.0-30.9, adult: Secondary | ICD-10-CM

## 2021-03-01 MED ORDER — PHENTERMINE HCL 37.5 MG PO TABS: 37.5000 mg | ORAL_TABLET | Freq: Every day | ORAL | 0 refills | Status: DC

## 2021-03-01 NOTE — Progress Notes (Signed)
[facility] [address] [place], [place] [address]  Internal MEDICINE  Office Visit Note  Patient Name: Jamie Meadows  [id number]  [id number]  Date of Service: 03/01/2021  Chief Complaint  Patient presents with   Annual Exam    HPI Areesha presents for an annual well visit and physical exam. She has ADHD and seasonal allergies. She sees an Web designer for [employer]. She had her mammogram in august and it was normal. Her ADHD symptoms are controlled by medication. She has lost 8 lbs since her last office visit. She has been working on losing weight and is working on diet and lifestyle modifications. She denies any pain. Per her pharmacy, her adderall is on backorder. She would like to try phentermine to help her lose weight and provide a stimulant effect while she is without her adderall.    Current Medication: Outpatient Encounter Medications as of 03/01/2021  Medication Sig   amphetamine-dextroamphetamine (ADDERALL XR) 20 MG 24 hr capsule Take 1 capsule (20 mg total) by mouth daily.   amphetamine-dextroamphetamine (ADDERALL XR) 20 MG 24 hr capsule Take 1 capsule (20 mg total) by mouth daily.   amphetamine-dextroamphetamine (ADDERALL) 10 MG tablet Take 1 tablet (10 mg total) by mouth 2 (two) times daily. As needed for symptoms of ADHD   amphetamine-dextroamphetamine (ADDERALL) 10 MG tablet Take 1 tablet (10 mg total) by mouth 2 (two) times daily. As needed for symptoms of ADHD   fluticasone (FLONASE) 50 MCG/ACT nasal spray Place into both nostrils daily.   levonorgestrel (MIRENA) 20 MCG/24HR IUD 1 each by Intrauterine route once.   Multiple Vitamin (MULTIVITAMIN) tablet Take 1 tablet by mouth daily.   [DISCONTINUED] fluticasone (FLONASE) 50 MCG/ACT nasal spray SHAKE LIQUID AND USE 2 SPRAYS IN EACH NOSTRIL DAILY   [DISCONTINUED] phentermine (ADIPEX-P) 37.5 MG tablet Take 1 tablet (37.5 mg total) by mouth daily before breakfast.   phentermine (ADIPEX-P) 37.5 MG tablet  Take 1 tablet (37.5 mg total) by mouth daily before breakfast.   [DISCONTINUED] amphetamine-dextroamphetamine (ADDERALL XR) 20 MG 24 hr capsule Take 1 capsule (20 mg total) by mouth daily. (Patient not taking: Reported on 03/01/2021)   [DISCONTINUED] amphetamine-dextroamphetamine (ADDERALL) 10 MG tablet Take 1 tablet (10 mg total) by mouth 2 (two) times daily. As needed for symptoms of ADHD (Patient not taking: Reported on 03/01/2021)   [DISCONTINUED] cetirizine (ZYRTEC) 10 MG tablet Take 1 tablet (10 mg total) by mouth daily. (Patient not taking: Reported on 03/01/2021)   [DISCONTINUED] phentermine (ADIPEX-P) 37.5 MG tablet Take 1 tablet (37.5 mg total) by mouth daily before breakfast.   No facility-administered encounter medications on file as of 03/01/2021.    Surgical History: Past Surgical History:  Procedure Laterality Date   ABDOMINOPLASTY  2017   COLPOSCOPY  09/21/2015   WISDOM TOOTH EXTRACTION      Medical History: Past Medical History:  Diagnosis Date   ADHD    Allergic rhinitis    Fracture of clavicle with routine healing right   History of abnormal cervical Pap smear 2017   LGSIL with HRHPV   Sinusitis    Wrist fracture, closed    left    Family History: Family History  Problem Relation Age of Onset   Diabetes Mother    Kidney disease Mother    Allergies Father    Breast cancer Maternal Aunt 36   Diabetes Maternal Grandfather    Hypertension Maternal Grandfather    Cancer Sister     Social History   Socioeconomic  History   Marital status: Divorced    Spouse name: Not on file   Number of children: 4   Years of education: 62   Highest education level: Not on file  Occupational History   Occupation: self emplyeed-[employer]  Tobacco Use   Smoking status: Former   Smokeless tobacco: Never  Building services engineer Use: Never used  Substance and Sexual Activity   Alcohol use: Yes    Alcohol/week: 7.0 standard drinks    Types: 7 Glasses of wine per week     Comment: occasionally   Drug use: Never   Sexual activity: Yes    Birth control/protection: I.U.D.  Other Topics Concern   Not on file  Social History Narrative   Not on file   Social Determinants of Health   Financial Resource Strain: Not on file  Food Insecurity: Not on file  Transportation Needs: Not on file  Physical Activity: Not on file  Stress: Not on file  Social Connections: Not on file  Intimate Partner Violence: Not on file      Review of Systems  Constitutional:  Negative for activity change, appetite change, chills, fatigue, fever and unexpected weight change.  HENT: Negative.  Negative for congestion, ear pain, rhinorrhea, sore throat and trouble swallowing.   Eyes: Negative.   Respiratory: Negative.  Negative for cough, chest tightness, shortness of breath and wheezing.   Cardiovascular: Negative.  Negative for chest pain.  Gastrointestinal: Negative.  Negative for abdominal pain, blood in stool, constipation, diarrhea, nausea and vomiting.  Endocrine: Negative.   Genitourinary: Negative.  Negative for difficulty urinating, dysuria, frequency, hematuria and urgency.  Musculoskeletal: Negative.  Negative for arthralgias, back pain, joint swelling, myalgias and neck pain.  Skin: Negative.  Negative for rash and wound.  Allergic/Immunologic: Negative.  Negative for immunocompromised state.  Neurological: Negative.  Negative for dizziness, seizures, numbness and headaches.  Hematological: Negative.   Psychiatric/Behavioral: Negative.  Negative for behavioral problems, self-injury and suicidal ideas. The patient is not nervous/anxious.    Vital Signs: BP 94/80   Pulse 95   Temp 98.5 F (36.9 C)   Resp 16   Ht 5\' 4"  (1.626 m)   Wt 179 lb 9.6 oz (81.5 kg)   SpO2 98%   BMI 30.83 kg/m    Physical Exam Vitals reviewed.  Constitutional:      General: She is awake. She is not in acute distress.    Appearance: Normal appearance. She is well-developed and  well-groomed. She is obese. She is not ill-appearing or diaphoretic.  HENT:     Head: Normocephalic and atraumatic.     Right Ear: Tympanic membrane, ear canal and external ear normal.     Left Ear: Tympanic membrane, ear canal and external ear normal.     Nose: Nose normal. No congestion or rhinorrhea.     Mouth/Throat:     Lips: Pink.     Mouth: Mucous membranes are moist.     Pharynx: Oropharynx is clear. Uvula midline. No oropharyngeal exudate.  Eyes:     General: Lids are normal. Vision grossly intact. Gaze aligned appropriately. No scleral icterus.       Right eye: No discharge.        Left eye: No discharge.     Conjunctiva/sclera: Conjunctivae normal.     Pupils: Pupils are equal, round, and reactive to light.     Funduscopic exam:    Right eye: Red reflex present.  Left eye: Red reflex present. Neck:     Thyroid: No thyromegaly.     Vascular: No JVD.     Trachea: Trachea and phonation normal. No tracheal deviation.  Cardiovascular:     Rate and Rhythm: Normal rate and regular rhythm.     Pulses: Normal pulses.     Heart sounds: Normal heart sounds, S1 normal and S2 normal. No murmur heard.   No friction rub. No gallop.  Pulmonary:     Effort: Pulmonary effort is normal. No accessory muscle usage or respiratory distress.     Breath sounds: Normal breath sounds and air entry. No stridor. No wheezing or rales.  Chest:     Chest wall: No tenderness.     Comments: Declined clinical breast exam Abdominal:     General: Bowel sounds are normal. There is no distension.     Palpations: Abdomen is soft. There is no shifting dullness, fluid wave, mass or pulsatile mass.     Tenderness: There is no abdominal tenderness. There is no guarding or rebound.  Musculoskeletal:        General: No tenderness or deformity. Normal range of motion.     Cervical back: Normal range of motion and neck supple.     Right lower leg: No edema.     Left lower leg: No edema.  Lymphadenopathy:      Cervical: No cervical adenopathy.  Skin:    General: Skin is warm and dry.     Coloration: Skin is not pale.     Findings: No erythema or rash.  Neurological:     Mental Status: She is alert.     Cranial Nerves: No cranial nerve deficit.     Motor: No abnormal muscle tone.     Coordination: Coordination normal.     Deep Tendon Reflexes: Reflexes are normal and symmetric.  Psychiatric:        Mood and Affect: Mood and affect normal.        Behavior: Behavior normal. Behavior is cooperative.        Thought Content: Thought content normal.        Judgment: Judgment normal.       Assessment/Plan: 1. Encounter for routine adult health examination with abnormal findings Age-appropriate preventive screenings and vaccinations discussed, annual physical exam completed. Routine labs were done in may and previously discussed.PHM updated.   2. Class 1 obesity due to excess calories without serious comorbidity with body mass index (BMI) of 30.0 to 30.9 in adult She has lost 8 lbs. She is trying phentermine while her adderall XR is unavailable. She wants to lose more weight. Her BMI is still considered obese. She is using phentermine along with diet and lifestyle modifications.  - phentermine (ADIPEX-P) 37.5 MG tablet; Take 1 tablet (37.5 mg total) by mouth daily before breakfast.  Dispense: 30 tablet; Refill: 0  3. Attention and concentration deficit Adderall XR is on national backorder. Patient requested 1 month of phentermine to help her with weight loss since it also works as a stimulant. Patient will not fill any adderall prescriptions while take phentermine. Note was also sent to the pharmacy with her prescription that instructed not to dispense adderall while she is taking phentermine. Once her adderall is back in stock and she is out of phentermine then her adderall prescription will be sent to the pharmacy. She is in agreement with this plan.  - phentermine (ADIPEX-P) 37.5 MG tablet;  Take 1 tablet (37.5 mg total) by mouth  daily before breakfast.  Dispense: 30 tablet; Refill: 0  4. Dysuria Routine urinalysis done.  - UA/M w/rflx Culture, Routine - Microscopic Examination - Urine Culture, Reflex      General Counseling: Verleen verbalizes understanding of the findings of todays visit and agrees with plan of treatment. I have discussed any further diagnostic evaluation that may be needed or ordered today. We also reviewed her medications today. she has been encouraged to call the office with any questions or concerns that should arise related to todays visit.    Orders Placed This Encounter  Procedures   Microscopic Examination   Urine Culture, Reflex   UA/M w/rflx Culture, Routine    Meds ordered this encounter  Medications   DISCONTD: phentermine (ADIPEX-P) 37.5 MG tablet    Sig: Take 1 tablet (37.5 mg total) by mouth daily before breakfast.    Dispense:  30 tablet    Refill:  0    Patient is out of her adderall XR and there is a Acupuncturist. Patient wants to try phentermine to help her with weight loss while her adderall XR is backordered. She will not be taking adderall while take phentermine. Do not fill adderall prescriptions while she is taking phentermine   DISCONTD: phentermine (ADIPEX-P) 37.5 MG tablet    Sig: Take 1 tablet (37.5 mg total) by mouth daily before breakfast.    Dispense:  30 tablet    Refill:  0    Patient is out of her adderall XR and there is a Acupuncturist. Patient wants to try phentermine to help her with weight loss while her adderall XR is backordered. She will not be taking adderall while take phentermine. Do not fill adderall prescriptions while she is taking phentermine   phentermine (ADIPEX-P) 37.5 MG tablet    Sig: Take 1 tablet (37.5 mg total) by mouth daily before breakfast.    Dispense:  30 tablet    Refill:  0    Patient is out of her adderall XR, its on national backorder. trying phentermine to help with  weight loss while her adderall is backordered.  Do not fill adderall prescriptions while she is taking phentermine    Return in about 4 weeks (around 03/29/2021) for F/U, Weight loss, Dalessandro Baldyga PCP.   Total time spent:30 Minutes Time spent includes review of chart, medications, test results, and follow up plan with the patient.   [employer] Controlled Substance Database was reviewed by me.  This patient was seen by Sallyanne Kuster, FNP-C in collaboration with Dr. Beverely Risen as a part of collaborative care agreement.  Albertus Chiarelli R. Tedd Sias, MSN, FNP-C Internal medicine

## 2021-03-02 ENCOUNTER — Telehealth: Payer: Self-pay

## 2021-03-02 MED ORDER — PHENTERMINE HCL 37.5 MG PO TABS: 37.5000 mg | ORAL_TABLET | Freq: Every day | ORAL | 0 refills | Status: DC

## 2021-03-03 NOTE — Telephone Encounter (Signed)
Pt informed medication sent to pharmacy.

## 2021-03-03 NOTE — Telephone Encounter (Signed)
Please let her know it is sent.

## 2021-03-05 LAB — UA/M W/RFLX CULTURE, ROUTINE
Bilirubin, UA: NEGATIVE
Glucose, UA: NEGATIVE
Ketones, UA: NEGATIVE
Leukocytes,UA: NEGATIVE
Nitrite, UA: NEGATIVE
RBC, UA: NEGATIVE
Specific Gravity, UA: >=1.03 — AB (ref 1.005–1.030)
Urobilinogen, Ur: 1 mg/dL (ref 0.2–1.0)
pH, UA: 7 (ref 5.0–7.5)

## 2021-03-05 LAB — MICROSCOPIC EXAMINATION
Casts: NONE SEEN /lpf
Epithelial Cells (non renal): >10 /hpf — AB (ref 0–10)
RBC, Urine: NONE SEEN /hpf (ref 0–2)

## 2021-03-05 LAB — URINE CULTURE, REFLEX: Organism ID, Bacteria: NO GROWTH

## 2021-03-18 ENCOUNTER — Encounter: Payer: Self-pay | Admitting: Nurse Practitioner

## 2021-03-19 ENCOUNTER — Other Ambulatory Visit: Payer: Self-pay | Admitting: Nurse Practitioner

## 2021-03-19 MED ORDER — SULFAMETHOXAZOLE-TRIMETHOPRIM 800-160 MG PO TABS: 1.0000 | ORAL_TABLET | Freq: Two times a day (BID) | ORAL | 0 refills | Status: DC

## 2021-03-28 ENCOUNTER — Other Ambulatory Visit: Payer: Self-pay | Admitting: Nurse Practitioner

## 2021-03-28 DIAGNOSIS — J301 Allergic rhinitis due to pollen: Secondary | ICD-10-CM

## 2021-03-30 ENCOUNTER — Encounter: Payer: Self-pay | Admitting: Nurse Practitioner

## 2021-03-30 ENCOUNTER — Other Ambulatory Visit: Payer: Self-pay

## 2021-03-30 ENCOUNTER — Ambulatory Visit: Payer: 59 | Admitting: Nurse Practitioner

## 2021-03-30 VITALS — BP 118/80 | HR 94 | Temp 97.3°F | Resp 16 | Ht 64.0 in | Wt 181.8 lb

## 2021-03-30 DIAGNOSIS — J309 Allergic rhinitis, unspecified: Secondary | ICD-10-CM

## 2021-03-30 DIAGNOSIS — Z23 Encounter for immunization: Secondary | ICD-10-CM | POA: Diagnosis not present

## 2021-03-30 DIAGNOSIS — E6609 Other obesity due to excess calories: Secondary | ICD-10-CM | POA: Diagnosis not present

## 2021-03-30 DIAGNOSIS — Z6830 Body mass index (BMI) 30.0-30.9, adult: Secondary | ICD-10-CM

## 2021-03-30 DIAGNOSIS — R41840 Attention and concentration deficit: Secondary | ICD-10-CM

## 2021-03-30 MED ORDER — PHENTERMINE HCL 37.5 MG PO TABS: 37.5000 mg | ORAL_TABLET | Freq: Every day | ORAL | 0 refills | Status: DC

## 2021-03-30 NOTE — Progress Notes (Signed)
[facility] [address] [place], [place] [address]  Internal MEDICINE  Office Visit Note  Patient Name: Jamie Meadows  [id number]  [id number]  Date of Service: 03/30/2021  Chief Complaint  Patient presents with   Follow-up   Weight Loss    HPI Jamie Meadows presents for a follow up visit for weight loss management and ADHD. Thinking about trying vyvanse instead of adderall but not ready to change yet. She also wants to get the flu shot.  She wants to take phentermine and only taking her prn dose of adderall while her adderall xr is on backorder.  Currently taking flonase for allergy symptom relief which is helping.       Current Medication: Outpatient Encounter Medications as of 03/30/2021  Medication Sig   amphetamine-dextroamphetamine (ADDERALL XR) 20 MG 24 hr capsule Take 1 capsule (20 mg total) by mouth daily.   amphetamine-dextroamphetamine (ADDERALL XR) 20 MG 24 hr capsule Take 1 capsule (20 mg total) by mouth daily.   amphetamine-dextroamphetamine (ADDERALL) 10 MG tablet Take 1 tablet (10 mg total) by mouth 2 (two) times daily. As needed for symptoms of ADHD   amphetamine-dextroamphetamine (ADDERALL) 10 MG tablet Take 1 tablet (10 mg total) by mouth 2 (two) times daily. As needed for symptoms of ADHD   fluticasone (FLONASE) 50 MCG/ACT nasal spray Place into both nostrils daily.   levonorgestrel (MIRENA) 20 MCG/24HR IUD 1 each by Intrauterine route once.   Multiple Vitamin (MULTIVITAMIN) tablet Take 1 tablet by mouth daily.   phentermine (ADIPEX-P) 37.5 MG tablet Take 1 tablet (37.5 mg total) by mouth daily before breakfast.   [DISCONTINUED] phentermine (ADIPEX-P) 37.5 MG tablet Take 1 tablet (37.5 mg total) by mouth daily before breakfast.   [DISCONTINUED] sulfamethoxazole-trimethoprim (BACTRIM DS) 800-160 MG tablet Take 1 tablet by mouth 2 (two) times daily. (Patient not taking: Reported on 03/30/2021)   No facility-administered encounter medications on  file as of 03/30/2021.    Surgical History: Past Surgical History:  Procedure Laterality Date   ABDOMINOPLASTY  2017   COLPOSCOPY  09/21/2015   WISDOM TOOTH EXTRACTION      Medical History: Past Medical History:  Diagnosis Date   ADHD    Allergic rhinitis    Fracture of clavicle with routine healing right   History of abnormal cervical Pap smear 2017   LGSIL with HRHPV   Sinusitis    Wrist fracture, closed    left    Family History: Family History  Problem Relation Age of Onset   Diabetes Mother    Kidney disease Mother    Allergies Father    Breast cancer Maternal Aunt 50   Diabetes Maternal Grandfather    Hypertension Maternal Grandfather    Cancer Sister     Social History   Socioeconomic History   Marital status: Divorced    Spouse name: Not on file   Number of children: 4   Years of education: 16   Highest education level: Not on file  Occupational History   Occupation: self emplyeed-[employer]  Tobacco Use   Smoking status: Former   Smokeless tobacco: Never  Building services engineer Use: Never used  Substance and Sexual Activity   Alcohol use: Yes    Alcohol/week: 7.0 standard drinks    Types: 7 Glasses of wine per week    Comment: occasionally   Drug use: Never   Sexual activity: Yes    Birth control/protection: I.U.D.  Other Topics Concern   Not on file  Social History Narrative   Not on file   Social Determinants of Health   Financial Resource Strain: Not on file  Food Insecurity: Not on file  Transportation Needs: Not on file  Physical Activity: Not on file  Stress: Not on file  Social Connections: Not on file  Intimate Partner Violence: Not on file      Review of Systems  Constitutional:  Negative for chills, fatigue and unexpected weight change.  HENT:  Positive for congestion and postnasal drip. Negative for rhinorrhea, sneezing and sore throat.   Eyes:  Negative for redness.  Respiratory:  Negative for cough, chest tightness and  shortness of breath.   Cardiovascular:  Negative for chest pain and palpitations.  Gastrointestinal:  Negative for abdominal pain, constipation, diarrhea, nausea and vomiting.  Genitourinary:  Negative for dysuria and frequency.  Musculoskeletal:  Negative for arthralgias, back pain, joint swelling and neck pain.  Skin:  Negative for rash.  Neurological: Negative.  Negative for tremors and numbness.  Hematological:  Negative for adenopathy. Does not bruise/bleed easily.  Psychiatric/Behavioral:  Negative for behavioral problems (Depression), sleep disturbance and suicidal ideas. The patient is not nervous/anxious.    Vital Signs: BP 118/80   Pulse 94   Temp (!) 97.3 F (36.3 C)   Resp 16   Ht 5\' 4"  (1.626 m)   Wt 181 lb 12.8 oz (82.5 kg)   SpO2 99%   BMI 31.21 kg/m    Physical Exam Vitals reviewed.  Constitutional:      General: She is not in acute distress.    Appearance: Normal appearance. She is obese. She is not ill-appearing.  HENT:     Head: Normocephalic and atraumatic.  Eyes:     Pupils: Pupils are equal, round, and reactive to light.  Cardiovascular:     Rate and Rhythm: Normal rate and regular rhythm.  Pulmonary:     Effort: Pulmonary effort is normal. No respiratory distress.  Neurological:     Mental Status: She is alert and oriented to person, place, and time.     Cranial Nerves: No cranial nerve deficit.     Coordination: Coordination normal.     Gait: Gait normal.  Psychiatric:        Mood and Affect: Mood normal.        Behavior: Behavior normal.       Assessment/Plan: 1. Chronic allergic rhinitis Stable , takes flonase, does not need refills.   2. Attention and concentration deficit Phentermine prescribed for weight loss but is also a stimulant, may aid in controlled ADHD symptoms while her adderall xr is on backorder. - phentermine (ADIPEX-P) 37.5 MG tablet; Take 1 tablet (37.5 mg total) by mouth daily before breakfast.  Dispense: 30 tablet;  Refill: 0  3. Class 1 obesity due to excess calories without serious comorbidity with body mass index (BMI) of 30.0 to 30.9 in adult Phentermine prescribed to aid in weight loss.  - phentermine (ADIPEX-P) 37.5 MG tablet; Take 1 tablet (37.5 mg total) by mouth daily before breakfast.  Dispense: 30 tablet; Refill: 0  4. Needs flu shot Administered in office today - Flu Vaccine MDCK QUAD PF   General Counseling: Shirlena verbalizes understanding of the findings of todays visit and agrees with plan of treatment. I have discussed any further diagnostic evaluation that may be needed or ordered today. We also reviewed her medications today. she has been encouraged to call the office with any questions or concerns that should arise related to todays  visit.    Orders Placed This Encounter  Procedures   Flu Vaccine MDCK QUAD PF    Meds ordered this encounter  Medications   phentermine (ADIPEX-P) 37.5 MG tablet    Sig: Take 1 tablet (37.5 mg total) by mouth daily before breakfast.    Dispense:  30 tablet    Refill:  0    Patient is out of her adderall XR, its on national backorder. trying phentermine to help with weight loss while her adderall is backordered.  Do not fill adderall prescriptions while she is taking phentermine    Return in about 1 month (around 04/29/2021) for F/U, Weight loss, Charliee Krenz PCP.   Total time spent:30 Minutes Time spent includes review of chart, medications, test results, and follow up plan with the patient.   [place] Controlled Substance Database was reviewed by me.  This patient was seen by Sallyanne Kuster, FNP-C in collaboration with Dr. Beverely Risen as a part of collaborative care agreement.   Arnetha Silverthorne R. Tedd Sias, MSN, FNP-C Internal medicine

## 2021-04-20 ENCOUNTER — Ambulatory Visit: Payer: 59 | Admitting: Nurse Practitioner

## 2021-04-23 ENCOUNTER — Encounter: Payer: Self-pay | Admitting: Nurse Practitioner

## 2021-04-27 ENCOUNTER — Other Ambulatory Visit: Payer: Self-pay

## 2021-04-27 ENCOUNTER — Ambulatory Visit: Payer: 59 | Admitting: Nurse Practitioner

## 2021-04-27 ENCOUNTER — Encounter: Payer: Self-pay | Admitting: Nurse Practitioner

## 2021-04-27 VITALS — BP 125/79 | HR 84 | Temp 98.2°F | Resp 16 | Ht 64.0 in | Wt 182.2 lb

## 2021-04-27 DIAGNOSIS — Z6830 Body mass index (BMI) 30.0-30.9, adult: Secondary | ICD-10-CM

## 2021-04-27 DIAGNOSIS — E6609 Other obesity due to excess calories: Secondary | ICD-10-CM | POA: Diagnosis not present

## 2021-04-27 DIAGNOSIS — J309 Allergic rhinitis, unspecified: Secondary | ICD-10-CM

## 2021-04-27 DIAGNOSIS — R41840 Attention and concentration deficit: Secondary | ICD-10-CM | POA: Diagnosis not present

## 2021-04-27 MED ORDER — AMPHETAMINE-DEXTROAMPHET ER 20 MG PO CP24: 20.0000 mg | ORAL_CAPSULE | Freq: Every day | ORAL | 0 refills | Status: DC

## 2021-04-27 MED ORDER — AMPHETAMINE-DEXTROAMPHETAMINE 10 MG PO TABS: 10.0000 mg | ORAL_TABLET | Freq: Two times a day (BID) | ORAL | 0 refills | Status: DC

## 2021-04-27 NOTE — Progress Notes (Signed)
[facility] [address] [place], [place] [address]  Internal MEDICINE  Office Visit Note  Patient Name: Jamie Meadows  [id number]  [id number]  Date of Service: 04/27/2021  Chief Complaint  Patient presents with   Follow-up    HPI Jamie Meadows presents for a follow-up visit for weight loss management and ADHD. Her extended release adderall is no longer on back order and is now available in the pharmacy. She is asking to stop the phentermine and go back to taking her adderall the same way it was prescribed previously. She has not been successful with losing weight on phentermine. She admits that she does not exercise. She has also been cooking foods for the family, eating pizza and baking cookies and other foods. She plans to start exercise program after christmas, hopefully in January.      Current Medication: Outpatient Encounter Medications as of 04/27/2021  Medication Sig   fluticasone (FLONASE) 50 MCG/ACT nasal spray Place into both nostrils daily.   levonorgestrel (MIRENA) 20 MCG/24HR IUD 1 each by Intrauterine route once.   Multiple Vitamin (MULTIVITAMIN) tablet Take 1 tablet by mouth daily.   [DISCONTINUED] amphetamine-dextroamphetamine (ADDERALL XR) 20 MG 24 hr capsule Take 1 capsule (20 mg total) by mouth daily.   [DISCONTINUED] amphetamine-dextroamphetamine (ADDERALL XR) 20 MG 24 hr capsule Take 1 capsule (20 mg total) by mouth daily.   [DISCONTINUED] amphetamine-dextroamphetamine (ADDERALL) 10 MG tablet Take 1 tablet (10 mg total) by mouth 2 (two) times daily. As needed for symptoms of ADHD   [DISCONTINUED] amphetamine-dextroamphetamine (ADDERALL) 10 MG tablet Take 1 tablet (10 mg total) by mouth 2 (two) times daily. As needed for symptoms of ADHD   [DISCONTINUED] phentermine (ADIPEX-P) 37.5 MG tablet Take 1 tablet (37.5 mg total) by mouth daily before breakfast.   amphetamine-dextroamphetamine (ADDERALL XR) 20 MG 24 hr capsule Take 1 capsule (20 mg total) by  mouth daily.   amphetamine-dextroamphetamine (ADDERALL XR) 20 MG 24 hr capsule Take 1 capsule (20 mg total) by mouth daily.   [START ON 06/22/2021] amphetamine-dextroamphetamine (ADDERALL XR) 20 MG 24 hr capsule Take 1 capsule (20 mg total) by mouth daily.   amphetamine-dextroamphetamine (ADDERALL) 10 MG tablet Take 1 tablet (10 mg total) by mouth 2 (two) times daily. As needed for symptoms of ADHD   No facility-administered encounter medications on file as of 04/27/2021.    Surgical History: Past Surgical History:  Procedure Laterality Date   ABDOMINOPLASTY  2017   COLPOSCOPY  09/21/2015   WISDOM TOOTH EXTRACTION      Medical History: Past Medical History:  Diagnosis Date   ADHD    Allergic rhinitis    Fracture of clavicle with routine healing right   History of abnormal cervical Pap smear 2017   LGSIL with HRHPV   Sinusitis    Wrist fracture, closed    left    Family History: Family History  Problem Relation Age of Onset   Diabetes Mother    Kidney disease Mother    Allergies Father    Breast cancer Maternal Aunt 50   Diabetes Maternal Grandfather    Hypertension Maternal Grandfather    Cancer Sister     Social History   Socioeconomic History   Marital status: Divorced    Spouse name: Not on file   Number of children: 4   Years of education: 16   Highest education level: Not on file  Occupational History   Occupation: self emplyeed-[employer]  Tobacco Use   Smoking status: Former  Smokeless tobacco: Never  Vaping Use   Vaping Use: Never used  Substance and Sexual Activity   Alcohol use: Yes    Alcohol/week: 7.0 standard drinks    Types: 7 Glasses of wine per week    Comment: occasionally   Drug use: Never   Sexual activity: Yes    Birth control/protection: I.U.D.  Other Topics Concern   Not on file  Social History Narrative   Not on file   Social Determinants of Health   Financial Resource Strain: Not on file  Food Insecurity: Not on file   Transportation Needs: Not on file  Physical Activity: Not on file  Stress: Not on file  Social Connections: Not on file  Intimate Partner Violence: Not on file      Review of Systems  Constitutional:  Negative for chills, fatigue and unexpected weight change.  HENT:  Positive for congestion and postnasal drip. Negative for rhinorrhea, sneezing and sore throat.   Eyes:  Negative for redness.  Respiratory:  Negative for cough, chest tightness and shortness of breath.   Cardiovascular:  Negative for chest pain and palpitations.  Gastrointestinal:  Negative for abdominal pain, constipation, diarrhea, nausea and vomiting.  Genitourinary:  Negative for dysuria and frequency.  Musculoskeletal:  Negative for arthralgias, back pain, joint swelling and neck pain.  Skin:  Negative for rash.  Neurological: Negative.  Negative for tremors and numbness.  Hematological:  Negative for adenopathy. Does not bruise/bleed easily.  Psychiatric/Behavioral:  Negative for behavioral problems (Depression), sleep disturbance and suicidal ideas. The patient is not nervous/anxious.    Vital Signs: BP 125/79   Pulse 84   Temp 98.2 F (36.8 C)   Resp 16   Ht 5\' 4"  (1.626 m)   Wt 182 lb 3.2 oz (82.6 kg)   SpO2 99%   BMI 31.27 kg/m    Physical Exam Vitals reviewed.  Constitutional:      General: She is not in acute distress.    Appearance: Normal appearance. She is obese. She is not ill-appearing.  HENT:     Head: Normocephalic and atraumatic.  Eyes:     Pupils: Pupils are equal, round, and reactive to light.  Cardiovascular:     Rate and Rhythm: Normal rate and regular rhythm.  Pulmonary:     Effort: Pulmonary effort is normal. No respiratory distress.  Neurological:     Mental Status: She is alert and oriented to person, place, and time.     Cranial Nerves: No cranial nerve deficit.     Coordination: Coordination normal.     Gait: Gait normal.  Psychiatric:        Mood and Affect: Mood  normal.        Behavior: Behavior normal.       Assessment/Plan: 1. Chronic allergic rhinitis Continue nasal spray, may use OTC allergy medications as desired.   2. Attention and concentration deficit Current medication and dose is effective, continue as prescribed, 3 months of refills sent, follow up in 3 months.  - amphetamine-dextroamphetamine (ADDERALL XR) 20 MG 24 hr capsule; Take 1 capsule (20 mg total) by mouth daily.  Dispense: 30 capsule; Refill: 0 - amphetamine-dextroamphetamine (ADDERALL XR) 20 MG 24 hr capsule; Take 1 capsule (20 mg total) by mouth daily.  Dispense: 30 capsule; Refill: 0 - amphetamine-dextroamphetamine (ADDERALL XR) 20 MG 24 hr capsule; Take 1 capsule (20 mg total) by mouth daily.  Dispense: 30 capsule; Refill: 0 - amphetamine-dextroamphetamine (ADDERALL) 10 MG tablet; Take 1 tablet (10  mg total) by mouth 2 (two) times daily. As needed for symptoms of ADHD  Dispense: 60 tablet; Refill: 0  3. Class 1 obesity due to excess calories without serious comorbidity with body mass index (BMI) of 30.0 to 30.9 in adult Phentermine stopped for now, may discuss weight loss management again at a later date. Patient would like to work on diet and lifestyle modifications on her own for now.    General Counseling: Jamie Meadows understanding of the findings of todays visit and agrees with plan of treatment. I have discussed any further diagnostic evaluation that may be needed or ordered today. We also reviewed her medications today. she has been encouraged to call the office with any questions or concerns that should arise related to todays visit.    No orders of the defined types were placed in this encounter.   Meds ordered this encounter  Medications   amphetamine-dextroamphetamine (ADDERALL XR) 20 MG 24 hr capsule    Sig: Take 1 capsule (20 mg total) by mouth daily.    Dispense:  30 capsule    Refill:  0    For 04/27/21   amphetamine-dextroamphetamine  (ADDERALL XR) 20 MG 24 hr capsule    Sig: Take 1 capsule (20 mg total) by mouth daily.    Dispense:  30 capsule    Refill:  0    For 05/25/21   amphetamine-dextroamphetamine (ADDERALL XR) 20 MG 24 hr capsule    Sig: Take 1 capsule (20 mg total) by mouth daily.    Dispense:  30 capsule    Refill:  0    For 06/22/21   amphetamine-dextroamphetamine (ADDERALL) 10 MG tablet    Sig: Take 1 tablet (10 mg total) by mouth 2 (two) times daily. As needed for symptoms of ADHD    Dispense:  60 tablet    Refill:  0    For 05/09/21    Return in about 3 months (around 07/26/2021) for F/U, ADHD med check, Evens Meno PCP, Weight loss.   Total time spent:30 Minutes Time spent includes review of chart, medications, test results, and follow up plan with the patient.   [place] Controlled Substance Database was reviewed by me.  This patient was seen by Sallyanne Kuster, FNP-C in collaboration with Dr. Beverely Risen as a part of collaborative care agreement.   Jerrie Gullo R. Tedd Sias, MSN, FNP-C Internal medicine

## 2021-05-26 ENCOUNTER — Encounter: Payer: Self-pay | Admitting: Nurse Practitioner

## 2021-07-21 ENCOUNTER — Encounter: Payer: Self-pay | Admitting: Nurse Practitioner

## 2021-07-21 DIAGNOSIS — R41840 Attention and concentration deficit: Secondary | ICD-10-CM

## 2021-07-23 MED ORDER — AMPHETAMINE-DEXTROAMPHETAMINE 10 MG PO TABS: 10.0000 mg | ORAL_TABLET | Freq: Two times a day (BID) | ORAL | 0 refills | Status: DC

## 2021-07-27 ENCOUNTER — Ambulatory Visit: Payer: 59 | Admitting: Nurse Practitioner

## 2021-07-27 ENCOUNTER — Encounter: Payer: Self-pay | Admitting: Nurse Practitioner

## 2021-07-27 ENCOUNTER — Other Ambulatory Visit: Payer: Self-pay

## 2021-07-27 VITALS — BP 138/70 | HR 86 | Temp 98.0°F | Resp 16 | Ht 64.0 in | Wt 183.6 lb

## 2021-07-27 DIAGNOSIS — Z6830 Body mass index (BMI) 30.0-30.9, adult: Secondary | ICD-10-CM | POA: Diagnosis not present

## 2021-07-27 DIAGNOSIS — R41840 Attention and concentration deficit: Secondary | ICD-10-CM | POA: Diagnosis not present

## 2021-07-27 DIAGNOSIS — J309 Allergic rhinitis, unspecified: Secondary | ICD-10-CM | POA: Diagnosis not present

## 2021-07-27 DIAGNOSIS — E6609 Other obesity due to excess calories: Secondary | ICD-10-CM | POA: Diagnosis not present

## 2021-07-27 MED ORDER — AMPHETAMINE-DEXTROAMPHETAMINE 10 MG PO TABS: 10.0000 mg | ORAL_TABLET | Freq: Two times a day (BID) | ORAL | 0 refills | Status: DC

## 2021-07-27 MED ORDER — AMPHETAMINE-DEXTROAMPHET ER 20 MG PO CP24: 20.0000 mg | ORAL_CAPSULE | Freq: Every day | ORAL | 0 refills | Status: DC

## 2021-07-27 NOTE — Progress Notes (Cosign Needed)
[facility] [address] [place], [place] [address]  Internal MEDICINE  Office Visit Note  Patient Name: Jamie Meadows  [id number]  [id number]  Date of Service: 07/27/2021  Chief Complaint  Patient presents with   Follow-up   ADHD   Weight Loss    HPI Jamie Meadows presents for follow-up visit for ADHD, weight loss and medication refills.  Her current medication doses are effective for control of her ADHD symptoms..  She denies any adverse side effects of Adderall.  She has been having an issue for several months with feeling tired and low energy.  No specific cause was found previously.  She is also constantly moving on ago has been busy daily schedule between work and home.  She is wondering if any of her vitamin levels are deficient.  Patient reports that she has started feeling better and has had more energy since she recently added iron supplement, B12 supplement, vitamin C and a probiotic to her daily regimen.    Current Medication: Outpatient Encounter Medications as of 07/27/2021  Medication Sig   fluticasone (FLONASE) 50 MCG/ACT nasal spray Place into both nostrils daily.   levonorgestrel (MIRENA) 20 MCG/24HR IUD 1 each by Intrauterine route once.   Multiple Vitamin (MULTIVITAMIN) tablet Take 1 tablet by mouth daily.   [DISCONTINUED] amphetamine-dextroamphetamine (ADDERALL XR) 20 MG 24 hr capsule Take 1 capsule (20 mg total) by mouth daily.   [DISCONTINUED] amphetamine-dextroamphetamine (ADDERALL XR) 20 MG 24 hr capsule Take 1 capsule (20 mg total) by mouth daily.   [DISCONTINUED] amphetamine-dextroamphetamine (ADDERALL XR) 20 MG 24 hr capsule Take 1 capsule (20 mg total) by mouth daily.   [DISCONTINUED] amphetamine-dextroamphetamine (ADDERALL) 10 MG tablet Take 1 tablet (10 mg total) by mouth 2 (two) times daily. As needed for symptoms of ADHD   amphetamine-dextroamphetamine (ADDERALL XR) 20 MG 24 hr capsule Take 1 capsule (20 mg total) by mouth daily.   [START ON  08/24/2021] amphetamine-dextroamphetamine (ADDERALL XR) 20 MG 24 hr capsule Take 1 capsule (20 mg total) by mouth daily.   [START ON 09/21/2021] amphetamine-dextroamphetamine (ADDERALL XR) 20 MG 24 hr capsule Take 1 capsule (20 mg total) by mouth daily.   [START ON 08/20/2021] amphetamine-dextroamphetamine (ADDERALL) 10 MG tablet Take 1 tablet (10 mg total) by mouth 2 (two) times daily. As needed for symptoms of ADHD   [START ON 09/17/2021] amphetamine-dextroamphetamine (ADDERALL) 10 MG tablet Take 1 tablet (10 mg total) by mouth 2 (two) times daily. As needed for symptoms of ADHD   No facility-administered encounter medications on file as of 07/27/2021.    Surgical History: Past Surgical History:  Procedure Laterality Date   ABDOMINOPLASTY  2017   COLPOSCOPY  09/21/2015   WISDOM TOOTH EXTRACTION      Medical History: Past Medical History:  Diagnosis Date   ADHD    Allergic rhinitis    Fracture of clavicle with routine healing right   History of abnormal cervical Pap smear 2017   LGSIL with HRHPV   Sinusitis    Wrist fracture, closed    left    Family History: Family History  Problem Relation Age of Onset   Diabetes Mother    Kidney disease Mother    Allergies Father    Breast cancer Maternal Aunt 50   Diabetes Maternal Grandfather    Hypertension Maternal Grandfather    Cancer Sister     Social History   Socioeconomic History   Marital status: Divorced    Spouse name: Not on file  Number of children: 4   Years of education: 16   Highest education level: Not on file  Occupational History   Occupation: self emplyeed-[employer]  Tobacco Use   Smoking status: Former   Smokeless tobacco: Never  Vaping Use   Vaping Use: Never used  Substance and Sexual Activity   Alcohol use: Yes    Alcohol/week: 7.0 standard drinks    Types: 7 Glasses of wine per week    Comment: occasionally   Drug use: Never   Sexual activity: Yes    Birth control/protection: I.U.D.  Other Topics  Concern   Not on file  Social History Narrative   Not on file   Social Determinants of Health   Financial Resource Strain: Not on file  Food Insecurity: Not on file  Transportation Needs: Not on file  Physical Activity: Not on file  Stress: Not on file  Social Connections: Not on file  Intimate Partner Violence: Not on file      Review of Systems  Constitutional:  Negative for chills, fatigue and unexpected weight change.  HENT:  Positive for congestion and postnasal drip. Negative for rhinorrhea, sneezing and sore throat.   Eyes:  Negative for redness.  Respiratory:  Negative for cough, chest tightness and shortness of breath.   Cardiovascular:  Negative for chest pain and palpitations.  Gastrointestinal:  Negative for abdominal pain, constipation, diarrhea, nausea and vomiting.  Genitourinary:  Negative for dysuria and frequency.  Musculoskeletal:  Negative for arthralgias, back pain, joint swelling and neck pain.  Skin:  Negative for rash.  Neurological: Negative.  Negative for tremors and numbness.  Hematological:  Negative for adenopathy. Does not bruise/bleed easily.  Psychiatric/Behavioral:  Negative for behavioral problems (Depression), sleep disturbance and suicidal ideas. The patient is not nervous/anxious.    Vital Signs: BP 138/70    Pulse 86    Temp 98 F (36.7 C)    Resp 16    Ht 5\' 4"  (1.626 m)    Wt 183 lb 9.6 oz (83.3 kg)    SpO2 99%    BMI 31.51 kg/m    Physical Exam Vitals reviewed.  Constitutional:      General: She is not in acute distress.    Appearance: Normal appearance. She is obese. She is not ill-appearing.  HENT:     Head: Normocephalic and atraumatic.  Eyes:     Pupils: Pupils are equal, round, and reactive to light.  Cardiovascular:     Rate and Rhythm: Normal rate and regular rhythm.  Pulmonary:     Effort: Pulmonary effort is normal. No respiratory distress.  Skin:    Findings: Rash: .diagme.  Neurological:     Mental Status: She  is alert and oriented to person, place, and time.  Psychiatric:        Mood and Affect: Mood normal.        Behavior: Behavior normal.       Assessment/Plan: 1. Chronic allergic rhinitis Stable, continues to take cetirizine  2. Attention and concentration deficit Current medication and dose is effective, no change. Refills x3 months sent to pharmacy, follow up in 3 months for additional refills.  - amphetamine-dextroamphetamine (ADDERALL XR) 20 MG 24 hr capsule; Take 1 capsule (20 mg total) by mouth daily.  Dispense: 30 capsule; Refill: 0 - amphetamine-dextroamphetamine (ADDERALL XR) 20 MG 24 hr capsule; Take 1 capsule (20 mg total) by mouth daily.  Dispense: 30 capsule; Refill: 0 - amphetamine-dextroamphetamine (ADDERALL XR) 20 MG 24 hr capsule;  Take 1 capsule (20 mg total) by mouth daily.  Dispense: 30 capsule; Refill: 0 - amphetamine-dextroamphetamine (ADDERALL) 10 MG tablet; Take 1 tablet (10 mg total) by mouth 2 (two) times daily. As needed for symptoms of ADHD  Dispense: 60 tablet; Refill: 0 - amphetamine-dextroamphetamine (ADDERALL) 10 MG tablet; Take 1 tablet (10 mg total) by mouth 2 (two) times daily. As needed for symptoms of ADHD  Dispense: 60 tablet; Refill: 0  3. Class 1 obesity due to excess calories without serious comorbidity with body mass index (BMI) of 30.0 to 30.9 in adult She is not currently on any medication to help with weight loss. She is working on taking OTC supplements to give her more energy including iron an B12, will discuss trying weight loss medication again in 3 months when her labs are repeated.    General Counseling: Jamie LevinsJennifer verbalizes understanding of the findings of todays visit and agrees with plan of treatment. I have discussed any further diagnostic evaluation that may be needed or ordered today. We also reviewed her medications today. she has been encouraged to call the office with any questions or concerns that should arise related to todays  visit.    No orders of the defined types were placed in this encounter.   Meds ordered this encounter  Medications   amphetamine-dextroamphetamine (ADDERALL XR) 20 MG 24 hr capsule    Sig: Take 1 capsule (20 mg total) by mouth daily.    Dispense:  30 capsule    Refill:  0    For 07/27/21   amphetamine-dextroamphetamine (ADDERALL XR) 20 MG 24 hr capsule    Sig: Take 1 capsule (20 mg total) by mouth daily.    Dispense:  30 capsule    Refill:  0    For 08/24/21   amphetamine-dextroamphetamine (ADDERALL XR) 20 MG 24 hr capsule    Sig: Take 1 capsule (20 mg total) by mouth daily.    Dispense:  30 capsule    Refill:  0    For 09/21/21   amphetamine-dextroamphetamine (ADDERALL) 10 MG tablet    Sig: Take 1 tablet (10 mg total) by mouth 2 (two) times daily. As needed for symptoms of ADHD    Dispense:  60 tablet    Refill:  0    For 08/20/20   amphetamine-dextroamphetamine (ADDERALL) 10 MG tablet    Sig: Take 1 tablet (10 mg total) by mouth 2 (two) times daily. As needed for symptoms of ADHD    Dispense:  60 tablet    Refill:  0    For 09/17/20    Return in about 3 months (around 10/27/2021) for F/U, ADHD med check, Yasuo Phimmasone PCP.   Total time spent:30 Minutes Time spent includes review of chart, medications, test results, and follow up plan with the patient.    Controlled Substance Database was reviewed by me.  This patient was seen by Sallyanne KusterAlyssa Thoms Barthelemy, FNP-C in collaboration with Dr. Beverely RisenFozia Khan as a part of collaborative care agreement.   Jamie Dobosz R. Tedd SiasAbernathy, MSN, FNP-C Internal medicine

## 2021-07-29 ENCOUNTER — Encounter: Payer: Self-pay | Admitting: Nurse Practitioner

## 2021-08-10 ENCOUNTER — Ambulatory Visit
Admission: EM | Admit: 2021-08-10 | Discharge: 2021-08-10 | Disposition: A | Discharge status: Home / Self Care | Payer: 59 | Attending: Emergency Medicine | Admitting: Emergency Medicine

## 2021-08-10 ENCOUNTER — Encounter: Payer: Self-pay | Admitting: Emergency Medicine

## 2021-08-10 ENCOUNTER — Other Ambulatory Visit: Payer: Self-pay

## 2021-08-10 DIAGNOSIS — J029 Acute pharyngitis, unspecified: Secondary | ICD-10-CM | POA: Insufficient documentation

## 2021-08-10 LAB — POCT RAPID STREP A (OFFICE): Rapid Strep A Screen: NEGATIVE

## 2021-08-10 NOTE — ED Provider Notes (Signed)
?[facility] ? ? ? ?CSN: [id number] ?Arrival date & time: 08/10/21  [id number] ? ? ?  ? ?History   ?Chief Complaint ?Chief Complaint  ?Patient presents with  ? Sore Throat  ?  Entered by patient  ? ? ?HPI ?RAEANNE Meadows is a 46 y.o. female.  Patient reports sore throat that started 08/03/2021.  Jamie Meadows had leftover antibiotics from a throat infection, she thinks it might of been amoxicillin, and she took the remaining 5 days of that medicine at Jamie 63-year-old Meadows's dose.  She reports that while she was taking the antibiotics, Jamie throat did not hurt at all.  She ran out of antibiotics 08/07/2021.  By the next day, Jamie sore throat was back.  She is worried she has strep that is undertreated.  Reports pain is worse in the morning and at night.  Denies fever or chills.  Denies any other associated symptoms such as nasal congestion or postnasal drainage but did have the symptoms last week. ? ? ?Sore Throat ? ? ?Past Medical History:  ?Diagnosis Date  ? ADHD   ? Allergic rhinitis   ? Fracture of clavicle with routine healing right  ? History of abnormal cervical Pap smear 2017  ? LGSIL with HRHPV  ? Sinusitis   ? Wrist fracture, closed   ? left  ? ? ?Patient Active Problem List  ? Diagnosis Date Noted  ? Acute non-recurrent frontal sinusitis 05/26/2020  ? Personal history of COVID-19 05/26/2020  ? Recurrent sinusitis 06/04/2018  ? Seasonal allergic rhinitis due to pollen 10/28/2017  ? Encounter for long-term (current) use of medications 09/13/2017  ? ASCUS with positive high risk HPV cervical 09/13/2017  ? LGSIL on Pap smear of cervix 09/05/2017  ? Attention and concentration deficit 07/30/2017  ? Candidiasis 07/30/2017  ? Chronic allergic rhinitis 07/30/2017  ? ? ?Past Surgical History:  ?Procedure Laterality Date  ? ABDOMINOPLASTY  2017  ? COLPOSCOPY  09/21/2015  ? WISDOM TOOTH EXTRACTION    ? ? ?OB History   ? ? Gravida  ?5  ? Para  ?4  ? Term  ?4  ? Preterm  ?   ? AB  ?1  ? Living  ?4  ?  ? ? SAB  ?1   ? IAB  ?   ? Ectopic  ?   ? Multiple  ?   ? Live Births  ?4  ?   ?  ?  ? ? ? ?Home Medications   ? ?Prior to Admission medications   ?Medication Sig Start Date End Date Taking? Authorizing Provider  ?amphetamine-dextroamphetamine (ADDERALL XR) 20 MG 24 hr capsule Take 1 capsule (20 mg total) by mouth daily. 07/27/21   Jonetta Osgood, NP  ?amphetamine-dextroamphetamine (ADDERALL XR) 20 MG 24 hr capsule Take 1 capsule (20 mg total) by mouth daily. 08/24/21   Jonetta Osgood, NP  ?amphetamine-dextroamphetamine (ADDERALL XR) 20 MG 24 hr capsule Take 1 capsule (20 mg total) by mouth daily. 09/21/21   Jonetta Osgood, NP  ?amphetamine-dextroamphetamine (ADDERALL) 10 MG tablet Take 1 tablet (10 mg total) by mouth 2 (two) times daily. As needed for symptoms of ADHD 08/20/21   Jonetta Osgood, NP  ?amphetamine-dextroamphetamine (ADDERALL) 10 MG tablet Take 1 tablet (10 mg total) by mouth 2 (two) times daily. As needed for symptoms of ADHD 09/17/21   Jonetta Osgood, NP  ?cetirizine (ZYRTEC) 10 MG tablet Take 10 mg by mouth daily. 05/02/21   [provider]  ?fluticasone (FLONASE) 50 MCG/ACT  nasal spray Place into both nostrils daily.    [provider]  ?levonorgestrel (MIRENA) 20 MCG/24HR IUD 1 each by Intrauterine route once.    [provider]  ?Multiple Vitamin (MULTIVITAMIN) tablet Take 1 tablet by mouth daily.    [provider]  ? ? ?Family History ?Family History  ?Problem Relation Age of Onset  ? Diabetes Mother   ? Kidney disease Mother   ? Allergies Father   ? Breast cancer Maternal Aunt 46  ? Diabetes Maternal Grandfather   ? Hypertension Maternal Grandfather   ? Cancer Sister   ? ? ?Social History ?Social History  ? ?Tobacco Use  ? Smoking status: Former  ? Smokeless tobacco: Never  ?Vaping Use  ? Vaping Use: Never used  ?Substance Use Topics  ? Alcohol use: Yes  ?  Alcohol/week: 7.0 standard drinks  ?  Types: 7 Glasses of wine per week  ?  Comment: occasionally  ? Drug  use: Never  ? ? ? ?Allergies   ?Patient has no known allergies. ? ? ?Review of Systems ?Review of Systems  ?Constitutional:  Negative for chills and fever.  ?HENT:  Positive for sore throat. Negative for congestion, postnasal drip and sinus pressure.   ?Respiratory:  Negative for cough.   ? ? ?Physical Exam ?Triage Vital Signs ?ED Triage Vitals [08/10/21 0913]  ?Enc Vitals Group  ?   BP 129/86  ?   Pulse Rate 85  ?   Resp 16  ?   Temp 98.2 ?F (36.8 ?C)  ?   Temp Source Oral  ?   SpO2 98 %  ?   Weight   ?   Height   ?   Head Circumference   ?   Peak Flow   ?   Pain Score   ?   Pain Loc   ?   Pain Edu?   ?   Excl. in [place]?   ? ?No data found. ? ?Updated Vital Signs ?BP 129/86 (BP Location: Left Arm)   Pulse 85   Temp 98.2 ?F (36.8 ?C) (Oral)   Resp 16   SpO2 98%  ? ?Visual Acuity ?Right Eye Distance:   ?Left Eye Distance:   ?Bilateral Distance:   ? ?Right Eye Near:   ?Left Eye Near:    ?Bilateral Near:    ? ?Physical Exam ?Constitutional:   ?   General: She is not in acute distress. ?   Appearance: She is well-developed. She is not ill-appearing.  ?HENT:  ?   Right Ear: Tympanic membrane and ear canal normal.  ?   Left Ear: Tympanic membrane and ear canal normal.  ?   Nose: No congestion or rhinorrhea.  ?   Mouth/Throat:  ?   Mouth: Mucous membranes are moist.  ?   Pharynx: Oropharynx is clear. No oropharyngeal exudate or posterior oropharyngeal erythema.  ?   Tonsils: No tonsillar exudate. 1+ on the right. 1+ on the left.  ?Cardiovascular:  ?   Rate and Rhythm: Normal rate and regular rhythm.  ?Pulmonary:  ?   Effort: Pulmonary effort is normal.  ?   Breath sounds: Normal breath sounds.  ?Neurological:  ?   Mental Status: She is alert.  ? ? ? ?UC Treatments / Results  ?Labs ?(all labs ordered are listed, but only abnormal results are displayed) ?Labs Reviewed  ?CULTURE, GROUP A STREP [facility])  ?POCT RAPID STREP A (OFFICE)  ? ? ?EKG ? ? ?Radiology ?No results found. ? ?  Procedures ?Procedures (including critical care  time) ? ?Medications Ordered in UC ?Medications - No data to display ? ?Initial Impression / Assessment and Plan / UC Course  ?I have reviewed the triage vital signs and the nursing notes. ? ?Pertinent labs & imaging results that were available during my care of the patient were reviewed by me and considered in my medical decision making (see chart for details). ? ?  ?Point-of-care strep negative.  Will send throat culture.  Patient understands she will be contacted only if test results are positive.  Reviewed supportive care measures. ? ?Final Clinical Impressions(s) / UC Diagnoses  ? ?Final diagnoses:  ?Pharyngitis, unspecified etiology  ? ? ? ?Discharge Instructions   ? ?  ?If the sore throat is from post-nasal drainage, drinking hot herbal tea, such as chamomile, with honey and lemon juice and it can help soothe the sore throat. ? ?You will get a call if test is positive, you will not get a call if test is negative but you can check results in MyChart if you have a MyChart account.  ? ? ? ?ED Prescriptions   ?None ?  ? ?PDMP not reviewed this encounter. ?  ?Carvel Getting, NP ?08/10/21 [phone] ? ?

## 2021-08-10 NOTE — Discharge Instructions (Addendum)
If the sore throat is from post-nasal drainage, drinking hot herbal tea, such as chamomile, with honey and lemon juice and it can help soothe the sore throat. ? ?You will get a call if test is positive, you will not get a call if test is negative but you can check results in MyChart if you have a MyChart account.  ? ?

## 2021-08-10 NOTE — ED Triage Notes (Signed)
Pt presents for ST x 1 week. She took left over abx for 5 days and sxs returned 2 days ago.  ?

## 2021-08-12 LAB — CULTURE, GROUP A STREP (THRC)

## 2021-10-19 ENCOUNTER — Encounter: Payer: Self-pay | Admitting: Nurse Practitioner

## 2021-10-19 ENCOUNTER — Ambulatory Visit (INDEPENDENT_AMBULATORY_CARE_PROVIDER_SITE_OTHER): Payer: 59 | Admitting: Nurse Practitioner

## 2021-10-19 VITALS — BP 118/80 | HR 91 | Temp 97.8°F | Resp 16 | Ht 64.0 in | Wt 187.0 lb

## 2021-10-19 DIAGNOSIS — R5383 Other fatigue: Secondary | ICD-10-CM

## 2021-10-19 DIAGNOSIS — Z79899 Other long term (current) drug therapy: Secondary | ICD-10-CM

## 2021-10-19 DIAGNOSIS — R41840 Attention and concentration deficit: Secondary | ICD-10-CM

## 2021-10-19 DIAGNOSIS — E559 Vitamin D deficiency, unspecified: Secondary | ICD-10-CM

## 2021-10-19 DIAGNOSIS — E538 Deficiency of other specified B group vitamins: Secondary | ICD-10-CM | POA: Diagnosis not present

## 2021-10-19 LAB — POCT URINE DRUG SCREEN
Methylenedioxyamphetamine: NOT DETECTED
POC Amphetamine UR: POSITIVE — AB
POC BENZODIAZEPINES UR: NOT DETECTED
POC Barbiturate UR: NOT DETECTED
POC Cocaine UR: NOT DETECTED
POC Ecstasy UR: NOT DETECTED
POC Marijuana UR: NOT DETECTED
POC Methadone UR: NOT DETECTED
POC Methamphetamine UR: NOT DETECTED
POC Opiate Ur: NOT DETECTED
POC Oxycodone UR: NOT DETECTED
POC PHENCYCLIDINE UR: NOT DETECTED
POC TRICYCLICS UR: NOT DETECTED

## 2021-10-19 MED ORDER — AMPHETAMINE-DEXTROAMPHET ER 20 MG PO CP24: 20.0000 mg | ORAL_CAPSULE | Freq: Every day | ORAL | 0 refills | Status: DC

## 2021-10-19 MED ORDER — AMPHETAMINE-DEXTROAMPHETAMINE 10 MG PO TABS: 10.0000 mg | ORAL_TABLET | Freq: Two times a day (BID) | ORAL | 0 refills | Status: DC

## 2021-10-19 NOTE — Progress Notes (Signed)
[facility] [place], [place] [address]  Internal MEDICINE  Office Visit Note  Patient Name: Jamie Meadows  [id number]  [id number]  Date of Service: 10/19/2021  Chief Complaint  Patient presents with   Follow-up    ADHD refills, UDS and discuss having labs drawn    HPI Jamie Meadows presents for a follow-up visit for ADHD, medication refills and continued fatigue and low energy.  Over the past year she has had fluctuating degrees of fatigue and low energy and we have checked her labs last year related to this.  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 The current dose and frequency Adderall is working well for her.  Her blood pressure and heart rate remain within normal limits.  She denies any palpitations or other adverse side effects of the medication. She is requesting lab work to further evaluate symptoms of fatigue, low energy and hair loss. UDS done today and was positive for amphetamines which is consistent with her current prescriptions.   Current Medication: Outpatient Encounter Medications as of 10/19/2021  Medication Sig   cetirizine (ZYRTEC) 10 MG tablet Take 10 mg by mouth daily.   fluticasone (FLONASE) 50 MCG/ACT nasal spray Place into both nostrils daily.   levonorgestrel (MIRENA) 20 MCG/24HR IUD 1 each by Intrauterine route once.   Multiple Vitamin (MULTIVITAMIN) tablet Take 1 tablet by mouth daily.   [DISCONTINUED] amphetamine-dextroamphetamine (ADDERALL XR) 20 MG 24 hr capsule Take 1 capsule (20 mg total) by mouth daily.   [DISCONTINUED] amphetamine-dextroamphetamine (ADDERALL XR) 20 MG 24 hr capsule Take 1 capsule (20 mg total) by mouth daily.   [DISCONTINUED] amphetamine-dextroamphetamine (ADDERALL XR) 20 MG 24 hr capsule Take 1 capsule (20 mg total) by mouth daily.   [DISCONTINUED] amphetamine-dextroamphetamine (ADDERALL) 10 MG tablet Take 1 tablet (10 mg  total) by mouth 2 (two) times daily. As needed for symptoms of ADHD   [DISCONTINUED] amphetamine-dextroamphetamine (ADDERALL) 10 MG tablet Take 1 tablet (10 mg total) by mouth 2 (two) times daily. As needed for symptoms of ADHD   amphetamine-dextroamphetamine (ADDERALL XR) 20 MG 24 hr capsule Take 1 capsule (20 mg total) by mouth daily.   [START ON 11/16/2021] amphetamine-dextroamphetamine (ADDERALL XR) 20 MG 24 hr capsule Take 1 capsule (20 mg total) by mouth daily.   [START ON 12/14/2021] amphetamine-dextroamphetamine (ADDERALL XR) 20 MG 24 hr capsule Take 1 capsule (20 mg total) by mouth daily.   amphetamine-dextroamphetamine (ADDERALL) 10 MG tablet Take 1 tablet (10 mg total) by mouth 2 (two) times daily. As needed for symptoms of ADHD   [START ON 11/16/2021] amphetamine-dextroamphetamine (ADDERALL) 10 MG tablet Take 1 tablet (10 mg total) by mouth 2 (two) times daily. As needed for symptoms of ADHD   [START ON 12/14/2021] amphetamine-dextroamphetamine (ADDERALL) 10 MG tablet Take 1 tablet (10 mg total) by mouth 2 (two) times daily. As needed for symptoms of ADHD   No facility-administered encounter medications on file as of 10/19/2021.    Surgical History: Past Surgical History:  Procedure Laterality Date   ABDOMINOPLASTY  2017   COLPOSCOPY  09/21/2015   WISDOM TOOTH EXTRACTION      Medical History: Past Medical History:  Diagnosis Date   ADHD    Allergic rhinitis    Fracture of clavicle with routine healing right   History of abnormal cervical Pap smear 2017   LGSIL with HRHPV   Sinusitis    Wrist fracture, closed  left    Family History: Family History  Problem Relation Age of Onset   Diabetes Mother    Kidney disease Mother    Allergies Father    Breast cancer Maternal Aunt 65   Diabetes Maternal Grandfather    Hypertension Maternal Grandfather    Cancer Sister     Social History   Socioeconomic History   Marital status: Divorced    Spouse name: Not on file    Number of children: 4   Years of education: 16   Highest education level: Not on file  Occupational History   Occupation: self [id number]  Tobacco Use   Smoking status: Former   Smokeless tobacco: Never  Scientific laboratory technician Use: Never used  Substance and Sexual Activity   Alcohol use: Yes    Alcohol/week: 7.0 standard drinks    Types: 7 Glasses of wine per week    Comment: occasionally   Drug use: Never   Sexual activity: Yes    Birth control/protection: I.U.D.  Other Topics Concern   Not on file  Social History Narrative   Not on file   Social Determinants of Health   Financial Resource Strain: Not on file  Food Insecurity: Not on file  Transportation Needs: Not on file  Physical Activity: Not on file  Stress: Not on file  Social Connections: Not on file  Intimate Partner Violence: Not on file      Review of Systems  Constitutional:  Positive for activity change (low energy) and fatigue. Negative for chills and unexpected weight change.  HENT:  Negative for congestion, rhinorrhea, sneezing and sore throat.   Respiratory: Negative.  Negative for cough, chest tightness and shortness of breath.   Cardiovascular: Negative.  Negative for chest pain and palpitations.  Gastrointestinal: Negative.  Negative for abdominal pain, constipation, diarrhea, nausea and vomiting.  Genitourinary:  Negative for dysuria and frequency.  Musculoskeletal: Negative.  Negative for arthralgias, back pain, joint swelling and neck pain.  Psychiatric/Behavioral:  Positive for sleep disturbance. Negative for behavioral problems (Depression), self-injury and suicidal ideas. The patient is not nervous/anxious.    Vital Signs: BP 118/80   Pulse 91   Temp 97.8 F (36.6 C)   Resp 16   Ht $R'5\' 4"'Jg$  (1.626 m)   Wt 187 lb (84.8 kg)   SpO2 96%   BMI 32.10 kg/m    Physical Exam Vitals reviewed.  Constitutional:      General: She is not in acute distress.    Appearance: Normal appearance.  She is obese. She is not ill-appearing.  HENT:     Head: Normocephalic and atraumatic.  Eyes:     Pupils: Pupils are equal, round, and reactive to light.  Cardiovascular:     Rate and Rhythm: Normal rate and regular rhythm.  Pulmonary:     Effort: Pulmonary effort is normal. No respiratory distress.  Neurological:     Mental Status: She is alert and oriented to person, place, and time.  Psychiatric:        Mood and Affect: Mood normal.        Behavior: Behavior normal.       Assessment/Plan: 1. Other fatigue Labs ordered for further evaluation of symptoms of fatigue and low energy and hair loss. - Iron, TIBC and Ferritin Panel - B12 and Folate Panel - Vitamin D (25 hydroxy) - CBC with Differential/Platelet - TSH + free T4 - CMP14+EGFR  2. B12 deficiency Labs ordered for further evaluation of symptoms  of fatigue and low energy and hair loss. - Iron, TIBC and Ferritin Panel - B12 and Folate Panel - Vitamin D (25 hydroxy) - CBC with Differential/Platelet - TSH + free T4 - CMP14+EGFR  3. Vitamin D deficiency Labs ordered for further evaluation of symptoms of fatigue and low energy and hair loss. - Iron, TIBC and Ferritin Panel - B12 and Folate Panel - Vitamin D (25 hydroxy) - CBC with Differential/Platelet - TSH + free T4 - CMP14+EGFR  4. Encounter for long-term (current) use of medications Positive for amphetamine which is so ordered - POCT Urine Drug Screen  5. Attention and concentration deficit Medication refills x3 months sent to pharmacy, follow-up in 3 months for additional refills - amphetamine-dextroamphetamine (ADDERALL XR) 20 MG 24 hr capsule; Take 1 capsule (20 mg total) by mouth daily.  Dispense: 30 capsule; Refill: 0 - amphetamine-dextroamphetamine (ADDERALL XR) 20 MG 24 hr capsule; Take 1 capsule (20 mg total) by mouth daily.  Dispense: 30 capsule; Refill: 0 - amphetamine-dextroamphetamine (ADDERALL XR) 20 MG 24 hr capsule; Take 1 capsule (20 mg  total) by mouth daily.  Dispense: 30 capsule; Refill: 0 - amphetamine-dextroamphetamine (ADDERALL) 10 MG tablet; Take 1 tablet (10 mg total) by mouth 2 (two) times daily. As needed for symptoms of ADHD  Dispense: 60 tablet; Refill: 0 - amphetamine-dextroamphetamine (ADDERALL) 10 MG tablet; Take 1 tablet (10 mg total) by mouth 2 (two) times daily. As needed for symptoms of ADHD  Dispense: 60 tablet; Refill: 0 - amphetamine-dextroamphetamine (ADDERALL) 10 MG tablet; Take 1 tablet (10 mg total) by mouth 2 (two) times daily. As needed for symptoms of ADHD  Dispense: 60 tablet; Refill: 0   General Counseling: Jamie Meadows verbalizes understanding of the findings of todays visit and agrees with plan of treatment. I have discussed any further diagnostic evaluation that may be needed or ordered today. We also reviewed her medications today. she has been encouraged to call the office with any questions or concerns that should arise related to todays visit.    Orders Placed This Encounter  Procedures   Iron, TIBC and Ferritin Panel   B12 and Folate Panel   Vitamin D (25 hydroxy)   CBC with Differential/Platelet   TSH + free T4   CMP14+EGFR   POCT Urine Drug Screen    Meds ordered this encounter  Medications   amphetamine-dextroamphetamine (ADDERALL XR) 20 MG 24 hr capsule    Sig: Take 1 capsule (20 mg total) by mouth daily.    Dispense:  30 capsule    Refill:  0    For 10/19/21   amphetamine-dextroamphetamine (ADDERALL XR) 20 MG 24 hr capsule    Sig: Take 1 capsule (20 mg total) by mouth daily.    Dispense:  30 capsule    Refill:  0    For 11/16/21   amphetamine-dextroamphetamine (ADDERALL XR) 20 MG 24 hr capsule    Sig: Take 1 capsule (20 mg total) by mouth daily.    Dispense:  30 capsule    Refill:  0    For 12/14/21   amphetamine-dextroamphetamine (ADDERALL) 10 MG tablet    Sig: Take 1 tablet (10 mg total) by mouth 2 (two) times daily. As needed for symptoms of ADHD    Dispense:  60 tablet     Refill:  0    For 10/19/21   amphetamine-dextroamphetamine (ADDERALL) 10 MG tablet    Sig: Take 1 tablet (10 mg total) by mouth 2 (two) times daily. As needed for symptoms  of ADHD    Dispense:  60 tablet    Refill:  0    For 11/16/21   amphetamine-dextroamphetamine (ADDERALL) 10 MG tablet    Sig: Take 1 tablet (10 mg total) by mouth 2 (two) times daily. As needed for symptoms of ADHD    Dispense:  60 tablet    Refill:  0    For 12/14/21    Return in about 3 months (around 01/19/2022) for F/U, ADHD med check, [place] PCP.   Total time spent:30 Minutes Time spent includes review of chart, medications, test results, and follow up plan with the patient.   [place] Controlled Substance Database was reviewed by me.  This patient was seen by Jonetta Osgood, FNP-C in collaboration with Dr. Clayborn Bigness as a part of collaborative care agreement.   Harrell Niehoff R. Valetta Fuller, MSN, FNP-C Internal medicine

## 2021-10-20 LAB — CMP14+EGFR
ALT: 20 IU/L (ref 0–32)
AST: 21 IU/L (ref 0–40)
Albumin/Globulin Ratio: 1.9 (ref 1.2–2.2)
Albumin: 4.7 g/dL (ref 3.8–4.8)
Alkaline Phosphatase: 53 IU/L (ref 44–121)
BUN/Creatinine Ratio: 15 (ref 9–23)
BUN: 11 mg/dL (ref 6–24)
Bilirubin Total: 0.5 mg/dL (ref 0.0–1.2)
CO2: 21 mmol/L (ref 20–29)
Calcium: 9.3 mg/dL (ref 8.7–10.2)
Chloride: 101 mmol/L (ref 96–106)
Creatinine, Ser: 0.74 mg/dL (ref 0.57–1.00)
Globulin, Total: 2.5 g/dL (ref 1.5–4.5)
Glucose: 105 mg/dL — ABNORMAL HIGH (ref 70–99)
Potassium: 4.2 mmol/L (ref 3.5–5.2)
Sodium: 138 mmol/L (ref 134–144)
Total Protein: 7.2 g/dL (ref 6.0–8.5)
eGFR: 102 mL/min/{1.73_m2} (ref >59)

## 2021-10-20 LAB — B12 AND FOLATE PANEL
Folate: 6.1 ng/mL (ref >3.0)
Vitamin B-12: 997 pg/mL (ref 232–1245)

## 2021-10-20 LAB — CBC WITH DIFFERENTIAL/PLATELET
Basophils Absolute: 0.1 10*3/uL (ref 0.0–0.2)
Basos: 1 %
EOS (ABSOLUTE): 0.1 10*3/uL (ref 0.0–0.4)
Eos: 1 %
Hematocrit: 40.3 % (ref 34.0–46.6)
Hemoglobin: 13.8 g/dL (ref 11.1–15.9)
Immature Grans (Abs): 0 10*3/uL (ref 0.0–0.1)
Immature Granulocytes: 0 %
Lymphocytes Absolute: 2 10*3/uL (ref 0.7–3.1)
Lymphs: 29 %
MCH: 31.8 pg (ref 26.6–33.0)
MCHC: 34.2 g/dL (ref 31.5–35.7)
MCV: 93 fL (ref 79–97)
Monocytes Absolute: 0.7 10*3/uL (ref 0.1–0.9)
Monocytes: 10 %
Neutrophils Absolute: 4.1 10*3/uL (ref 1.4–7.0)
Neutrophils: 59 %
Platelets: 298 10*3/uL (ref 150–450)
RBC: 4.34 x10E6/uL (ref 3.77–5.28)
RDW: 12 % (ref 11.7–15.4)
WBC: 7 10*3/uL (ref 3.4–10.8)

## 2021-10-20 LAB — TSH+FREE T4
Free T4: 1.15 ng/dL (ref 0.82–1.77)
TSH: 1.88 u[IU]/mL (ref 0.450–4.500)

## 2021-10-20 LAB — IRON,TIBC AND FERRITIN PANEL
Ferritin: 51 ng/mL (ref 15–150)
Iron Saturation: 25 % (ref 15–55)
Iron: 90 ug/dL (ref 27–159)
Total Iron Binding Capacity: 361 ug/dL (ref 250–450)
UIBC: 271 ug/dL (ref 131–425)

## 2021-10-20 LAB — VITAMIN D 25 HYDROXY (VIT D DEFICIENCY, FRACTURES): Vit D, 25-Hydroxy: 40.4 ng/mL (ref 30.0–100.0)

## 2022-01-02 ENCOUNTER — Encounter: Payer: Self-pay | Admitting: Nurse Practitioner

## 2022-01-02 ENCOUNTER — Ambulatory Visit: Payer: 59 | Admitting: Nurse Practitioner

## 2022-01-02 VITALS — BP 120/78 | HR 80 | Temp 98.4°F | Resp 16 | Ht 64.0 in | Wt 189.0 lb

## 2022-01-02 DIAGNOSIS — J029 Acute pharyngitis, unspecified: Secondary | ICD-10-CM | POA: Diagnosis not present

## 2022-01-02 DIAGNOSIS — J0110 Acute frontal sinusitis, unspecified: Secondary | ICD-10-CM

## 2022-01-02 LAB — POCT RAPID STREP A (OFFICE): Rapid Strep A Screen: NEGATIVE

## 2022-01-02 MED ORDER — AMOXICILLIN-POT CLAVULANATE 875-125 MG PO TABS: 1.0000 | ORAL_TABLET | Freq: Two times a day (BID) | ORAL | 0 refills | Status: AC

## 2022-01-02 NOTE — Progress Notes (Signed)
[facility] [address] [place], [place] [address]  Internal MEDICINE  Office Visit Note  Patient Name: Jamie Meadows  [id number]  [id number]  Date of Service: 01/02/2022  Chief Complaint  Patient presents with   Sore Throat    Sore throat, runny nose, fatigue, body aches - NEG COVID     HPI [place] presents for an acute sick visit for symptoms of sinusitis.  Reports Sore throat, runny nose, fatigue, body,  Negative for covid Strep was negative. All of her kids were positive for strep throat though.     Current Medication:  Outpatient Encounter Medications as of 01/02/2022  Medication Sig   [EXPIRED] amoxicillin-clavulanate (AUGMENTIN) 875-125 MG tablet Take 1 tablet by mouth 2 (two) times daily for 10 days.   cetirizine (ZYRTEC) 10 MG tablet Take 10 mg by mouth daily.   fluticasone (FLONASE) 50 MCG/ACT nasal spray Place into both nostrils daily.   levonorgestrel (MIRENA) 20 MCG/24HR IUD 1 each by Intrauterine route once.   Multiple Vitamin (MULTIVITAMIN) tablet Take 1 tablet by mouth daily.   [DISCONTINUED] amphetamine-dextroamphetamine (ADDERALL XR) 20 MG 24 hr capsule Take 1 capsule (20 mg total) by mouth daily.   [DISCONTINUED] amphetamine-dextroamphetamine (ADDERALL XR) 20 MG 24 hr capsule Take 1 capsule (20 mg total) by mouth daily.   [DISCONTINUED] amphetamine-dextroamphetamine (ADDERALL XR) 20 MG 24 hr capsule Take 1 capsule (20 mg total) by mouth daily.   [DISCONTINUED] amphetamine-dextroamphetamine (ADDERALL) 10 MG tablet Take 1 tablet (10 mg total) by mouth 2 (two) times daily. As needed for symptoms of ADHD   [DISCONTINUED] amphetamine-dextroamphetamine (ADDERALL) 10 MG tablet Take 1 tablet (10 mg total) by mouth 2 (two) times daily. As needed for symptoms of ADHD   [DISCONTINUED] amphetamine-dextroamphetamine (ADDERALL) 10 MG tablet Take 1 tablet (10 mg total) by mouth 2 (two) times daily. As needed for symptoms of ADHD   No  facility-administered encounter medications on file as of 01/02/2022.      Medical History: Past Medical History:  Diagnosis Date   ADHD    Allergic rhinitis    Fracture of clavicle with routine healing right   History of abnormal cervical Pap smear 2017   LGSIL with HRHPV   Sinusitis    Wrist fracture, closed    left     Vital Signs: BP 120/78   Pulse 80   Temp 98.4 F (36.9 C)   Resp 16   Ht 5\' 4"  (1.626 m)   Wt 189 lb (85.7 kg)   SpO2 98%   BMI 32.44 kg/m    Review of Systems  Constitutional:  Positive for fatigue.  HENT:  Positive for congestion, ear pain, postnasal drip, rhinorrhea, sinus pressure, sinus pain and sore throat.   Respiratory:  Positive for cough. Negative for chest tightness, shortness of breath and wheezing.   Cardiovascular: Negative.  Negative for chest pain and palpitations.  Gastrointestinal: Negative.  Negative for abdominal pain, constipation, diarrhea, nausea and vomiting.  Musculoskeletal: Negative.  Negative for arthralgias and back pain.  Neurological:  Positive for headaches.  Psychiatric/Behavioral:  Negative for self-injury, sleep disturbance and suicidal ideas. The patient is not nervous/anxious.     Physical Exam Vitals reviewed.  Constitutional:      General: She is awake.     Appearance: She is well-developed and well-groomed. She is ill-appearing. She is not toxic-appearing or diaphoretic.  HENT:     Head: Normocephalic and atraumatic.     Right Ear: External ear normal. Decreased hearing noted.  Tenderness present. A middle ear effusion is present. Tympanic membrane is injected.     Left Ear: External ear normal. Decreased hearing noted. Tenderness present. A middle ear effusion is present. Tympanic membrane is injected.     Nose: Nasal tenderness, mucosal edema, congestion and rhinorrhea present.     Right Turbinates: Swollen and pale.     Left Turbinates: Swollen and pale.     Right Sinus: Maxillary sinus tenderness and  frontal sinus tenderness present.     Left Sinus: Maxillary sinus tenderness and frontal sinus tenderness present.     Mouth/Throat:     Lips: Pink.     Pharynx: Posterior oropharyngeal erythema present.  Eyes:     Pupils: Pupils are equal, round, and reactive to light.  Cardiovascular:     Rate and Rhythm: Normal rate and regular rhythm.     Heart sounds: Normal heart sounds. No murmur heard. Pulmonary:     Effort: Pulmonary effort is normal. No respiratory distress.     Breath sounds: Normal breath sounds. No wheezing.  Neurological:     Mental Status: She is alert and oriented to person, place, and time.  Psychiatric:        Mood and Affect: Mood normal.        Behavior: Behavior normal. Behavior is cooperative.       Assessment/Plan: 1. Acute non-recurrent frontal sinusitis Empiric antibiotic treament prescribed - amoxicillin-clavulanate (AUGMENTIN) 875-125 MG tablet; Take 1 tablet by mouth 2 (two) times daily for 10 days.  Dispense: 20 tablet; Refill: 0  2. Sore throat Negative for strep - POCT rapid strep A    General Counseling: Shreshta verbalizes understanding of the findings of todays visit and agrees with plan of treatment. I have discussed any further diagnostic evaluation that may be needed or ordered today. We also reviewed her medications today. she has been encouraged to call the office with any questions or concerns that should arise related to todays visit.    Counseling:    Orders Placed This Encounter  Procedures   POCT rapid strep A    Meds ordered this encounter  Medications   amoxicillin-clavulanate (AUGMENTIN) 875-125 MG tablet    Sig: Take 1 tablet by mouth 2 (two) times daily for 10 days.    Dispense:  20 tablet    Refill:  0    Return if symptoms worsen or fail to improve.  [place] Controlled Substance Database was reviewed by me for overdose risk score (ORS)  Time spent:20 Minutes Time spent with patient included reviewing progress  notes, labs, imaging studies, and discussing plan for follow up.   This patient was seen by Sallyanne Kuster, FNP-C in collaboration with Dr. Beverely Risen as a part of collaborative care agreement.  Duayne Brideau R. Tedd Sias, MSN, FNP-C Internal Medicine

## 2022-01-14 ENCOUNTER — Other Ambulatory Visit: Payer: Self-pay | Admitting: Internal Medicine

## 2022-01-14 ENCOUNTER — Encounter: Payer: Self-pay | Admitting: Nurse Practitioner

## 2022-01-14 MED ORDER — FLUCONAZOLE 150 MG PO TABS: ORAL_TABLET | ORAL | 0 refills | Status: DC

## 2022-01-18 ENCOUNTER — Encounter: Payer: Self-pay | Admitting: Nurse Practitioner

## 2022-01-18 ENCOUNTER — Ambulatory Visit (INDEPENDENT_AMBULATORY_CARE_PROVIDER_SITE_OTHER): Payer: 59 | Admitting: Nurse Practitioner

## 2022-01-18 VITALS — BP 138/82 | HR 84 | Temp 97.8°F | Resp 16 | Ht 64.0 in | Wt 187.0 lb

## 2022-01-18 DIAGNOSIS — R41840 Attention and concentration deficit: Secondary | ICD-10-CM | POA: Diagnosis not present

## 2022-01-18 DIAGNOSIS — Z79899 Other long term (current) drug therapy: Secondary | ICD-10-CM | POA: Diagnosis not present

## 2022-01-18 DIAGNOSIS — J029 Acute pharyngitis, unspecified: Secondary | ICD-10-CM

## 2022-01-18 DIAGNOSIS — R5383 Other fatigue: Secondary | ICD-10-CM | POA: Diagnosis not present

## 2022-01-18 MED ORDER — AMPHETAMINE-DEXTROAMPHETAMINE 10 MG PO TABS: 10.0000 mg | ORAL_TABLET | Freq: Two times a day (BID) | ORAL | 0 refills | Status: DC

## 2022-01-18 MED ORDER — AMPHETAMINE-DEXTROAMPHET ER 20 MG PO CP24: 20.0000 mg | ORAL_CAPSULE | Freq: Every day | ORAL | 0 refills | Status: DC

## 2022-01-18 NOTE — Progress Notes (Signed)
[facility], [place] [address]  Internal MEDICINE  Office Visit Note  Patient Name: Jamie Meadows  [id number]  [id number]  Date of Service: 01/18/2022  Chief Complaint  Patient presents with   Follow-up   ADHD    HPI Jamie Meadows presents for a follow up visit for ADHD, strep and refills. ADHD -- current dose of adderall is effective per patient. Denies any palpitations or other adverse side effects of the medication. Heart rate and BP are stable.  Had strep throat a while ago, symptoms were treated and have resolved. Reprots she is feeling much better.  Med refills needed.  Feels fatigued often but does not give self much time to rest esp during her children's school year.     Current Medication: Outpatient Encounter Medications as of 01/18/2022  Medication Sig   cetirizine (ZYRTEC) 10 MG tablet Take 10 mg by mouth daily.   fluconazole (DIFLUCAN) 150 MG tablet Take one tab a day for 3 days   fluticasone (FLONASE) 50 MCG/ACT nasal spray Place into both nostrils daily.   levonorgestrel (MIRENA) 20 MCG/24HR IUD 1 each by Intrauterine route once.   Multiple Vitamin (MULTIVITAMIN) tablet Take 1 tablet by mouth daily.   [DISCONTINUED] amphetamine-dextroamphetamine (ADDERALL XR) 20 MG 24 hr capsule Take 1 capsule (20 mg total) by mouth daily.   [DISCONTINUED] amphetamine-dextroamphetamine (ADDERALL XR) 20 MG 24 hr capsule Take 1 capsule (20 mg total) by mouth daily.   [DISCONTINUED] amphetamine-dextroamphetamine (ADDERALL XR) 20 MG 24 hr capsule Take 1 capsule (20 mg total) by mouth daily.   [DISCONTINUED] amphetamine-dextroamphetamine (ADDERALL) 10 MG tablet Take 1 tablet (10 mg total) by mouth 2 (two) times daily. As needed for symptoms of ADHD   [DISCONTINUED] amphetamine-dextroamphetamine (ADDERALL) 10 MG tablet Take 1 tablet (10 mg total) by mouth 2 (two) times daily. As needed for symptoms of ADHD   [DISCONTINUED] amphetamine-dextroamphetamine  (ADDERALL) 10 MG tablet Take 1 tablet (10 mg total) by mouth 2 (two) times daily. As needed for symptoms of ADHD   amphetamine-dextroamphetamine (ADDERALL XR) 20 MG 24 hr capsule Take 1 capsule (20 mg total) by mouth daily.   [START ON 02/15/2022] amphetamine-dextroamphetamine (ADDERALL XR) 20 MG 24 hr capsule Take 1 capsule (20 mg total) by mouth daily.   [START ON 03/15/2022] amphetamine-dextroamphetamine (ADDERALL XR) 20 MG 24 hr capsule Take 1 capsule (20 mg total) by mouth daily.   amphetamine-dextroamphetamine (ADDERALL) 10 MG tablet Take 1 tablet (10 mg total) by mouth 2 (two) times daily. As needed for symptoms of ADHD   [START ON 02/15/2022] amphetamine-dextroamphetamine (ADDERALL) 10 MG tablet Take 1 tablet (10 mg total) by mouth 2 (two) times daily. As needed for symptoms of ADHD   [START ON 03/15/2022] amphetamine-dextroamphetamine (ADDERALL) 10 MG tablet Take 1 tablet (10 mg total) by mouth 2 (two) times daily. As needed for symptoms of ADHD   No facility-administered encounter medications on file as of 01/18/2022.    Surgical History: Past Surgical History:  Procedure Laterality Date   ABDOMINOPLASTY  2017   COLPOSCOPY  09/21/2015   WISDOM TOOTH EXTRACTION      Medical History: Past Medical History:  Diagnosis Date   ADHD    Allergic rhinitis    Fracture of clavicle with routine healing right   History of abnormal cervical Pap smear 2017   LGSIL with HRHPV   Sinusitis    Wrist fracture, closed    left    Family History: Family History  Problem Relation  Age of Onset   Diabetes Mother    Kidney disease Mother    Allergies Father    Breast cancer Maternal Aunt 57   Diabetes Maternal Grandfather    Hypertension Maternal Grandfather    Cancer Sister     Social History   Socioeconomic History   Marital status: Divorced    Spouse name: Not on file   Number of children: 4   Years of education: 16   Highest education level: Not on file  Occupational History    Occupation: self emplyeed-[employer]  Tobacco Use   Smoking status: Former   Smokeless tobacco: Never  Building services engineer Use: Never used  Substance and Sexual Activity   Alcohol use: Yes    Alcohol/week: 7.0 standard drinks of alcohol    Types: 7 Glasses of wine per week    Comment: occasionally   Drug use: Never   Sexual activity: Yes    Birth control/protection: I.U.D.  Other Topics Concern   Not on file  Social History Narrative   Not on file   Social Determinants of Health   Financial Resource Strain: Not on file  Food Insecurity: Not on file  Transportation Needs: Not on file  Physical Activity: Insufficiently Active (09/04/2017)   Exercise Vital Sign    Days of Exercise per Week: 4 days    Minutes of Exercise per Session: 30 min  Stress: No Stress Concern Present (09/04/2017)   [employer] of Occupational Health - Occupational Stress Questionnaire    Feeling of Stress : Not at all  Social Connections: Not on file  Intimate Partner Violence: Not on file      Review of Systems  Constitutional:  Positive for activity change (low energy) and fatigue. Negative for chills and unexpected weight change.  HENT:  Negative for congestion, rhinorrhea, sneezing and sore throat.   Respiratory: Negative.  Negative for cough, chest tightness and shortness of breath.   Cardiovascular: Negative.  Negative for chest pain and palpitations.  Gastrointestinal: Negative.  Negative for abdominal pain, constipation, diarrhea, nausea and vomiting.  Genitourinary:  Negative for dysuria and frequency.  Musculoskeletal: Negative.  Negative for arthralgias, back pain, joint swelling and neck pain.  Psychiatric/Behavioral:  Positive for sleep disturbance. Negative for behavioral problems (Depression), self-injury and suicidal ideas. The patient is not nervous/anxious.     Vital Signs: BP 138/82   Pulse 84   Temp 97.8 F (36.6 C)   Resp 16   Ht 5\' 4"  (1.626 m)   Wt 187 lb (84.8 kg)    SpO2 99%   BMI 32.10 kg/m    Physical Exam Vitals reviewed.  Constitutional:      General: She is not in acute distress.    Appearance: Normal appearance. She is obese. She is not ill-appearing.  HENT:     Head: Normocephalic and atraumatic.  Eyes:     Pupils: Pupils are equal, round, and reactive to light.  Cardiovascular:     Rate and Rhythm: Normal rate and regular rhythm.  Pulmonary:     Effort: Pulmonary effort is normal. No respiratory distress.  Neurological:     Mental Status: She is alert and oriented to person, place, and time.  Psychiatric:        Mood and Affect: Mood normal.        Behavior: Behavior normal.        Assessment/Plan: 1. Other fatigue Fatigue due to active and busy weekly schedule. Not much time to  rest and partake in self-care activities. Encouraged patient to take time for herself and consistently set aside time for herself.   2. Attention and concentration deficit Refills x3 months ordered, follow up in 3 months for additional refills, UDS due next office visit.  - amphetamine-dextroamphetamine (ADDERALL XR) 20 MG 24 hr capsule; Take 1 capsule (20 mg total) by mouth daily.  Dispense: 30 capsule; Refill: 0 - amphetamine-dextroamphetamine (ADDERALL XR) 20 MG 24 hr capsule; Take 1 capsule (20 mg total) by mouth daily.  Dispense: 30 capsule; Refill: 0 - amphetamine-dextroamphetamine (ADDERALL XR) 20 MG 24 hr capsule; Take 1 capsule (20 mg total) by mouth daily.  Dispense: 30 capsule; Refill: 0 - amphetamine-dextroamphetamine (ADDERALL) 10 MG tablet; Take 1 tablet (10 mg total) by mouth 2 (two) times daily. As needed for symptoms of ADHD  Dispense: 60 tablet; Refill: 0 - amphetamine-dextroamphetamine (ADDERALL) 10 MG tablet; Take 1 tablet (10 mg total) by mouth 2 (two) times daily. As needed for symptoms of ADHD  Dispense: 60 tablet; Refill: 0 - amphetamine-dextroamphetamine (ADDERALL) 10 MG tablet; Take 1 tablet (10 mg total) by mouth 2 (two) times  daily. As needed for symptoms of ADHD  Dispense: 60 tablet; Refill: 0  3. Sore throat resolved  4. Encounter for long-term (current) use of medications UDS due at next office visit in 3 months   General Counseling: [place]ita verbalizes understanding of the findings of todays visit and agrees with plan of treatment. I have discussed any further diagnostic evaluation that may be needed or ordered today. We also reviewed her medications today. she has been encouraged to call the office with any questions or concerns that should arise related to todays visit.    No orders of the defined types were placed in this encounter.   Meds ordered this encounter  Medications   amphetamine-dextroamphetamine (ADDERALL XR) 20 MG 24 hr capsule    Sig: Take 1 capsule (20 mg total) by mouth daily.    Dispense:  30 capsule    Refill:  0    Fill for 01/18/22   amphetamine-dextroamphetamine (ADDERALL XR) 20 MG 24 hr capsule    Sig: Take 1 capsule (20 mg total) by mouth daily.    Dispense:  30 capsule    Refill:  0    Fill for 02/15/22   amphetamine-dextroamphetamine (ADDERALL XR) 20 MG 24 hr capsule    Sig: Take 1 capsule (20 mg total) by mouth daily.    Dispense:  30 capsule    Refill:  0    Fill for 03/15/22   amphetamine-dextroamphetamine (ADDERALL) 10 MG tablet    Sig: Take 1 tablet (10 mg total) by mouth 2 (two) times daily. As needed for symptoms of ADHD    Dispense:  60 tablet    Refill:  0    Fill for 01/18/22   amphetamine-dextroamphetamine (ADDERALL) 10 MG tablet    Sig: Take 1 tablet (10 mg total) by mouth 2 (two) times daily. As needed for symptoms of ADHD    Dispense:  60 tablet    Refill:  0    Fill for 02/15/22   amphetamine-dextroamphetamine (ADDERALL) 10 MG tablet    Sig: Take 1 tablet (10 mg total) by mouth 2 (two) times daily. As needed for symptoms of ADHD    Dispense:  60 tablet    Refill:  0    Fill for 03/15/22    Return in about 3 months (around 04/19/2022) for F/U, ADHD  med check, Demetria Lightsey PCP.  Total time spent:30 Minutes Time spent includes review of chart, medications, test results, and follow up plan with the patient.   [place] Controlled Substance Database was reviewed by me.  This patient was seen by Jonetta Osgood, FNP-C in collaboration with Dr. Clayborn Bigness as a part of collaborative care agreement.   Athelene Hursey R. Valetta Fuller, MSN, FNP-C Internal medicine

## 2022-02-09 ENCOUNTER — Other Ambulatory Visit: Payer: Self-pay | Admitting: Nurse Practitioner

## 2022-02-09 DIAGNOSIS — Z1231 Encounter for screening mammogram for malignant neoplasm of breast: Secondary | ICD-10-CM

## 2022-02-21 ENCOUNTER — Encounter: Payer: 59 | Admitting: Nurse Practitioner

## 2022-03-03 ENCOUNTER — Encounter: Payer: Self-pay | Admitting: Nurse Practitioner

## 2022-03-05 ENCOUNTER — Ambulatory Visit (INDEPENDENT_AMBULATORY_CARE_PROVIDER_SITE_OTHER): Payer: 59 | Admitting: Nurse Practitioner

## 2022-03-05 ENCOUNTER — Encounter: Payer: Self-pay | Admitting: Nurse Practitioner

## 2022-03-05 VITALS — BP 110/80 | HR 88 | Temp 98.0°F | Resp 16 | Ht 65.0 in | Wt 187.0 lb

## 2022-03-05 DIAGNOSIS — Z0001 Encounter for general adult medical examination with abnormal findings: Secondary | ICD-10-CM | POA: Diagnosis not present

## 2022-03-05 DIAGNOSIS — R300 Dysuria: Secondary | ICD-10-CM

## 2022-03-05 DIAGNOSIS — Z23 Encounter for immunization: Secondary | ICD-10-CM

## 2022-03-05 NOTE — Progress Notes (Signed)
[facility] [address] [place], [place] [address]  Internal MEDICINE  Office Visit Note  Patient Name: Jamie Meadows  [id number]  [id number]  Date of Service: 03/05/2022  Chief Complaint  Patient presents with   Annual Exam   ADHD    HPI [name] presents for an annual well visit and physical exam.  Well appearing 46 year old female with ADHD and allergic rhinitis.  Sees OBGYN for women's health--- pap due next year. Mammogram is scheduled per patient report.  Due for colorectal cancer screening next year.  Labs deferred for now, full set of labs done in June.  Not due for any refills yet.      Current Medication: Outpatient Encounter Medications as of 03/05/2022  Medication Sig   amphetamine-dextroamphetamine (ADDERALL XR) 20 MG 24 hr capsule Take 1 capsule (20 mg total) by mouth daily.   amphetamine-dextroamphetamine (ADDERALL XR) 20 MG 24 hr capsule Take 1 capsule (20 mg total) by mouth daily.   [START ON 03/15/2022] amphetamine-dextroamphetamine (ADDERALL XR) 20 MG 24 hr capsule Take 1 capsule (20 mg total) by mouth daily.   amphetamine-dextroamphetamine (ADDERALL) 10 MG tablet Take 1 tablet (10 mg total) by mouth 2 (two) times daily. As needed for symptoms of ADHD   amphetamine-dextroamphetamine (ADDERALL) 10 MG tablet Take 1 tablet (10 mg total) by mouth 2 (two) times daily. As needed for symptoms of ADHD   [START ON 03/15/2022] amphetamine-dextroamphetamine (ADDERALL) 10 MG tablet Take 1 tablet (10 mg total) by mouth 2 (two) times daily. As needed for symptoms of ADHD   cetirizine (ZYRTEC) 10 MG tablet Take 10 mg by mouth daily.   fluconazole (DIFLUCAN) 150 MG tablet Take one tab a day for 3 days   fluticasone (FLONASE) 50 MCG/ACT nasal spray Place into both nostrils daily.   levonorgestrel (MIRENA) 20 MCG/24HR IUD 1 each by Intrauterine route once.   Multiple Vitamin (MULTIVITAMIN) tablet Take 1 tablet by mouth daily.   No facility-administered  encounter medications on file as of 03/05/2022.    Surgical History: Past Surgical History:  Procedure Laterality Date   ABDOMINOPLASTY  2017   COLPOSCOPY  09/21/2015   WISDOM TOOTH EXTRACTION      Medical History: Past Medical History:  Diagnosis Date   ADHD    Allergic rhinitis    Fracture of clavicle with routine healing right   History of abnormal cervical Pap smear 2017   LGSIL with HRHPV   Sinusitis    Wrist fracture, closed    left    Family History: Family History  Problem Relation Age of Onset   Diabetes Mother    Kidney disease Mother    Allergies Father    Breast cancer Maternal Aunt 50   Diabetes Maternal Grandfather    Hypertension Maternal Grandfather    Cancer Sister     Social History   Socioeconomic History   Marital status: Divorced    Spouse name: Not on file   Number of children: 4   Years of education: 16   Highest education level: Not on file  Occupational History   Occupation: self emplyeed-[employer]  Tobacco Use   Smoking status: Former   Smokeless tobacco: Never  Building services engineer Use: Never used  Substance and Sexual Activity   Alcohol use: Yes    Alcohol/week: 7.0 standard drinks of alcohol    Types: 7 Glasses of wine per week    Comment: occasionally   Drug use: Never   Sexual  activity: Yes    Birth control/protection: I.U.D.  Other Topics Concern   Not on file  Social History Narrative   Not on file   Social Determinants of Health   Financial Resource Strain: Not on file  Food Insecurity: Not on file  Transportation Needs: Not on file  Physical Activity: Insufficiently Active (09/04/2017)   Exercise Vital Sign    Days of Exercise per Week: 4 days    Minutes of Exercise per Session: 30 min  Stress: No Stress Concern Present (09/04/2017)   [place]    Feeling of Stress : Not at all  Social Connections: Not on file  Intimate Partner Violence: Not on  file      Review of Systems  Constitutional:  Negative for activity change, appetite change, chills, fatigue, fever and unexpected weight change.  HENT: Negative.  Negative for congestion, ear pain, rhinorrhea, sore throat and trouble swallowing.   Eyes: Negative.   Respiratory: Negative.  Negative for cough, chest tightness, shortness of breath and wheezing.   Cardiovascular: Negative.  Negative for chest pain.  Gastrointestinal: Negative.  Negative for abdominal pain, blood in stool, constipation, diarrhea, nausea and vomiting.  Endocrine: Negative.   Genitourinary: Negative.  Negative for difficulty urinating, dysuria, frequency, hematuria and urgency.  Musculoskeletal: Negative.  Negative for arthralgias, back pain, joint swelling, myalgias and neck pain.  Skin: Negative.  Negative for rash and wound.  Allergic/Immunologic: Negative.  Negative for immunocompromised state.  Neurological: Negative.  Negative for dizziness, seizures, numbness and headaches.  Hematological: Negative.   Psychiatric/Behavioral: Negative.  Negative for behavioral problems, self-injury and suicidal ideas. The patient is not nervous/anxious.     Vital Signs: BP 110/80   Pulse 88   Temp 98 F (36.7 C)   Resp 16   Ht 5\' 5"  (1.651 m)   Wt 187 lb (84.8 kg)   SpO2 99%   BMI 31.12 kg/m    Physical Exam Vitals reviewed.  Constitutional:      General: She is awake. She is not in acute distress.    Appearance: Normal appearance. She is well-developed and well-groomed. She is obese. She is not ill-appearing or diaphoretic.  HENT:     Head: Normocephalic and atraumatic.     Right Ear: Tympanic membrane, ear canal and external ear normal.     Left Ear: Tympanic membrane, ear canal and external ear normal.     Nose: Nose normal. No congestion or rhinorrhea.     Mouth/Throat:     Lips: Pink.     Mouth: Mucous membranes are moist.     Pharynx: Oropharynx is clear. Uvula midline. No oropharyngeal exudate.   Eyes:     General: Lids are normal. Vision grossly intact. Gaze aligned appropriately. No scleral icterus.       Right eye: No discharge.        Left eye: No discharge.     Conjunctiva/sclera: Conjunctivae normal.     Pupils: Pupils are equal, round, and reactive to light.     Funduscopic exam:    Right eye: Red reflex present.        Left eye: Red reflex present. Neck:     Thyroid: No thyromegaly.     Vascular: No JVD.     Trachea: Trachea and phonation normal. No tracheal deviation.  Cardiovascular:     Rate and Rhythm: Normal rate and regular rhythm.     Pulses: Normal pulses.  Heart sounds: Normal heart sounds, S1 normal and S2 normal. No murmur heard.    No friction rub. No gallop.  Pulmonary:     Effort: Pulmonary effort is normal. No accessory muscle usage or respiratory distress.     Breath sounds: Normal breath sounds and air entry. No stridor. No wheezing or rales.  Chest:     Chest wall: No tenderness.     Comments: Declined clinical breast exam Abdominal:     General: Bowel sounds are normal. There is no distension.     Palpations: Abdomen is soft. There is no shifting dullness, fluid wave, mass or pulsatile mass.     Tenderness: There is no abdominal tenderness. There is no guarding or rebound.  Musculoskeletal:        General: No tenderness or deformity. Normal range of motion.     Cervical back: Normal range of motion and neck supple.     Right lower leg: No edema.     Left lower leg: No edema.  Lymphadenopathy:     Cervical: No cervical adenopathy.  Skin:    General: Skin is warm and dry.     Coloration: Skin is not pale.     Findings: No erythema or rash.  Neurological:     Mental Status: She is alert.     Cranial Nerves: No cranial nerve deficit.     Motor: No abnormal muscle tone.     Coordination: Coordination normal.     Deep Tendon Reflexes: Reflexes are normal and symmetric.  Psychiatric:        Mood and Affect: Mood and affect normal.         Behavior: Behavior normal. Behavior is cooperative.        Thought Content: Thought content normal.        Judgment: Judgment normal.        Assessment/Plan: 1. Encounter for routine adult health examination with abnormal findings Age-appropriate preventive screenings and vaccinations discussed, annual physical exam completed. Routine labs for health maintenance deferred. PHM updated.   2. Dysuria Routine urinalysis done - UA/M w/rflx Culture, Routine - Microscopic Examination  3. Flu vaccine need Flu vaccine administered in office today - Flu Vaccine MDCK QUAD PF      General Counseling: Luciana verbalizes understanding of the findings of todays visit and agrees with plan of treatment. I have discussed any further diagnostic evaluation that may be needed or ordered today. We also reviewed her medications today. she has been encouraged to call the office with any questions or concerns that should arise related to todays visit.    Orders Placed This Encounter  Procedures   Flu Vaccine MDCK QUAD PF   UA/M w/rflx Culture, Routine    No orders of the defined types were placed in this encounter.   Return for previously scheduled in november.   Total time spent:30 Minutes Time spent includes review of chart, medications, test results, and follow up plan with the patient.   [place] Controlled Substance Database was reviewed by me.  This patient was seen by Sallyanne Kuster, FNP-C in collaboration with Dr. Beverely Risen as a part of collaborative care agreement.  Mclean Moya R. Tedd Sias, MSN, FNP-C Internal medicine

## 2022-03-06 LAB — UA/M W/RFLX CULTURE, ROUTINE
Bilirubin, UA: NEGATIVE
Glucose, UA: NEGATIVE
Ketones, UA: NEGATIVE
Leukocytes,UA: NEGATIVE
Nitrite, UA: NEGATIVE
Protein,UA: NEGATIVE
RBC, UA: NEGATIVE
Specific Gravity, UA: 1.016 (ref 1.005–1.030)
Urobilinogen, Ur: 0.2 mg/dL (ref 0.2–1.0)
pH, UA: 7 (ref 5.0–7.5)

## 2022-03-06 LAB — MICROSCOPIC EXAMINATION
Casts: NONE SEEN /lpf
WBC, UA: NONE SEEN /hpf (ref 0–5)

## 2022-03-09 ENCOUNTER — Ambulatory Visit
Admission: RE | Admit: 2022-03-09 | Discharge: 2022-03-09 | Disposition: A | Discharge status: Home / Self Care | Payer: 59 | Source: Ambulatory Visit | Attending: Nurse Practitioner | Admitting: Nurse Practitioner

## 2022-03-09 DIAGNOSIS — Z1231 Encounter for screening mammogram for malignant neoplasm of breast: Secondary | ICD-10-CM | POA: Insufficient documentation

## 2022-03-20 ENCOUNTER — Encounter: Payer: Self-pay | Admitting: Nurse Practitioner

## 2022-04-19 ENCOUNTER — Encounter: Payer: Self-pay | Admitting: Nurse Practitioner

## 2022-04-19 ENCOUNTER — Ambulatory Visit: Payer: 59 | Admitting: Nurse Practitioner

## 2022-04-19 VITALS — BP 128/80 | HR 88 | Temp 98.0°F | Resp 16 | Ht 65.0 in | Wt 186.0 lb

## 2022-04-19 DIAGNOSIS — R41840 Attention and concentration deficit: Secondary | ICD-10-CM

## 2022-04-19 DIAGNOSIS — Z760 Encounter for issue of repeat prescription: Secondary | ICD-10-CM | POA: Diagnosis not present

## 2022-04-19 DIAGNOSIS — Z79899 Other long term (current) drug therapy: Secondary | ICD-10-CM | POA: Diagnosis not present

## 2022-04-19 DIAGNOSIS — R5383 Other fatigue: Secondary | ICD-10-CM

## 2022-04-19 MED ORDER — AMPHETAMINE-DEXTROAMPHETAMINE 10 MG PO TABS: 10.0000 mg | ORAL_TABLET | Freq: Two times a day (BID) | ORAL | 0 refills | Status: DC

## 2022-04-19 MED ORDER — AMPHETAMINE-DEXTROAMPHET ER 20 MG PO CP24: 20.0000 mg | ORAL_CAPSULE | Freq: Every day | ORAL | 0 refills | Status: DC

## 2022-04-19 NOTE — Progress Notes (Signed)
[facility] [address] [place], [place] [address]  Internal MEDICINE  Office Visit Note  Patient Name: Jamie Meadows  [id number]  [id number]  Date of Service: 04/19/2022  Chief Complaint  Patient presents with   ADHD    refills    HPI Jamie Meadows presents for a follow- up visit for ADHD, medication refills, and fatigue, ADHD -- current dose of adderall is effective per patient. Denies any palpitations or other adverse side effects of the medication. Heart rate and BP are stable.  Med refills, defer UDS today, will do UDS in 3 months.  Fatigue -- baseline fatigue due to demanding home/work schedule. Feels fatigued often but does not give self much time to rest esp during her children's school year.     Current Medication: Outpatient Encounter Medications as of 04/19/2022  Medication Sig   cetirizine (ZYRTEC) 10 MG tablet Take 10 mg by mouth daily.   fluconazole (DIFLUCAN) 150 MG tablet Take one tab a day for 3 days   fluticasone (FLONASE) 50 MCG/ACT nasal spray Place into both nostrils daily.   levonorgestrel (MIRENA) 20 MCG/24HR IUD 1 each by Intrauterine route once.   Multiple Vitamin (MULTIVITAMIN) tablet Take 1 tablet by mouth daily.   [DISCONTINUED] amphetamine-dextroamphetamine (ADDERALL XR) 20 MG 24 hr capsule Take 1 capsule (20 mg total) by mouth daily.   [DISCONTINUED] amphetamine-dextroamphetamine (ADDERALL XR) 20 MG 24 hr capsule Take 1 capsule (20 mg total) by mouth daily.   [DISCONTINUED] amphetamine-dextroamphetamine (ADDERALL XR) 20 MG 24 hr capsule Take 1 capsule (20 mg total) by mouth daily.   [DISCONTINUED] amphetamine-dextroamphetamine (ADDERALL) 10 MG tablet Take 1 tablet (10 mg total) by mouth 2 (two) times daily. As needed for symptoms of ADHD   [DISCONTINUED] amphetamine-dextroamphetamine (ADDERALL) 10 MG tablet Take 1 tablet (10 mg total) by mouth 2 (two) times daily. As needed for symptoms of ADHD   [DISCONTINUED]  amphetamine-dextroamphetamine (ADDERALL) 10 MG tablet Take 1 tablet (10 mg total) by mouth 2 (two) times daily. As needed for symptoms of ADHD   amphetamine-dextroamphetamine (ADDERALL XR) 20 MG 24 hr capsule Take 1 capsule (20 mg total) by mouth daily.   [START ON 05/17/2022] amphetamine-dextroamphetamine (ADDERALL XR) 20 MG 24 hr capsule Take 1 capsule (20 mg total) by mouth daily.   [START ON 06/14/2022] amphetamine-dextroamphetamine (ADDERALL XR) 20 MG 24 hr capsule Take 1 capsule (20 mg total) by mouth daily.   amphetamine-dextroamphetamine (ADDERALL) 10 MG tablet Take 1 tablet (10 mg total) by mouth 2 (two) times daily. As needed for symptoms of ADHD   [START ON 05/17/2022] amphetamine-dextroamphetamine (ADDERALL) 10 MG tablet Take 1 tablet (10 mg total) by mouth 2 (two) times daily. As needed for symptoms of ADHD   [START ON 06/14/2022] amphetamine-dextroamphetamine (ADDERALL) 10 MG tablet Take 1 tablet (10 mg total) by mouth 2 (two) times daily. As needed for symptoms of ADHD   No facility-administered encounter medications on file as of 04/19/2022.    Surgical History: Past Surgical History:  Procedure Laterality Date   ABDOMINOPLASTY  2017   COLPOSCOPY  09/21/2015   WISDOM TOOTH EXTRACTION      Medical History: Past Medical History:  Diagnosis Date   ADHD    Allergic rhinitis    Fracture of clavicle with routine healing right   History of abnormal cervical Pap smear 2017   LGSIL with HRHPV   Sinusitis    Wrist fracture, closed    left    Family History: Family History  Problem Relation  Age of Onset   Diabetes Mother    Kidney disease Mother    Allergies Father    Breast cancer Maternal Aunt 53   Diabetes Maternal Grandfather    Hypertension Maternal Grandfather    Cancer Sister     Social History   Socioeconomic History   Marital status: Divorced    Spouse name: Not on file   Number of children: 4   Years of education: 16   Highest education level: Not on  file  Occupational History   Occupation: self emplyeed-[employer]  Tobacco Use   Smoking status: Former   Smokeless tobacco: Never  Building services engineer Use: Never used  Substance and Sexual Activity   Alcohol use: Yes    Alcohol/week: 7.0 standard drinks of alcohol    Types: 7 Glasses of wine per week    Comment: occasionally   Drug use: Never   Sexual activity: Yes    Birth control/protection: I.U.D.  Other Topics Concern   Not on file  Social History Narrative   Not on file   Social Determinants of Health   Financial Resource Strain: Not on file  Food Insecurity: Not on file  Transportation Needs: Not on file  Physical Activity: Insufficiently Active (09/04/2017)   Exercise Vital Sign    Days of Exercise per Week: 4 days    Minutes of Exercise per Session: 30 min  Stress: No Stress Concern Present (09/04/2017)   [employer] of Occupational Health - Occupational Stress Questionnaire    Feeling of Stress : Not at all  Social Connections: Not on file  Intimate Partner Violence: Not on file      Review of Systems  Constitutional:  Positive for activity change (low energy) and fatigue. Negative for chills and unexpected weight change.  HENT:  Negative for congestion, rhinorrhea, sneezing and sore throat.   Respiratory: Negative.  Negative for cough, chest tightness and shortness of breath.   Cardiovascular: Negative.  Negative for chest pain and palpitations.  Gastrointestinal: Negative.  Negative for abdominal pain, constipation, diarrhea, nausea and vomiting.  Genitourinary:  Negative for dysuria and frequency.  Musculoskeletal: Negative.  Negative for arthralgias, back pain, joint swelling and neck pain.  Psychiatric/Behavioral:  Positive for sleep disturbance. Negative for behavioral problems (Depression), self-injury and suicidal ideas. The patient is not nervous/anxious.     Vital Signs: BP 128/80   Pulse 88   Temp 98 F (36.7 C)   Resp 16   Ht 5\' 5"   (1.651 m)   Wt 186 lb (84.4 kg)   SpO2 98%   BMI 30.95 kg/m    Physical Exam Vitals reviewed.  Constitutional:      General: She is not in acute distress.    Appearance: Normal appearance. She is obese. She is not ill-appearing.  HENT:     Head: Normocephalic and atraumatic.  Eyes:     Pupils: Pupils are equal, round, and reactive to light.  Cardiovascular:     Rate and Rhythm: Normal rate and regular rhythm.  Pulmonary:     Effort: Pulmonary effort is normal. No respiratory distress.  Neurological:     Mental Status: She is alert and oriented to person, place, and time.  Psychiatric:        Mood and Affect: Mood normal.        Behavior: Behavior normal.        Assessment/Plan: 1. Other fatigue Fatigue due to active and busy weekly schedule. Not much time to  rest and partake in self-care activities. Encouraged patient to take time for herself and consistently set aside time for herself.    2. Attention and concentration deficit Refills x3 months ordered, follow up in 3 months for additional refills, UDS due next office visit.    3. Medication refill - amphetamine-dextroamphetamine (ADDERALL XR) 20 MG 24 hr capsule; Take 1 capsule (20 mg total) by mouth daily.  Dispense: 30 capsule; Refill: 0 - amphetamine-dextroamphetamine (ADDERALL XR) 20 MG 24 hr capsule; Take 1 capsule (20 mg total) by mouth daily.  Dispense: 30 capsule; Refill: 0 - amphetamine-dextroamphetamine (ADDERALL XR) 20 MG 24 hr capsule; Take 1 capsule (20 mg total) by mouth daily.  Dispense: 30 capsule; Refill: 0 - amphetamine-dextroamphetamine (ADDERALL) 10 MG tablet; Take 1 tablet (10 mg total) by mouth 2 (two) times daily. As needed for symptoms of ADHD  Dispense: 60 tablet; Refill: 0 - amphetamine-dextroamphetamine (ADDERALL) 10 MG tablet; Take 1 tablet (10 mg total) by mouth 2 (two) times daily. As needed for symptoms of ADHD  Dispense: 60 tablet; Refill: 0 - amphetamine-dextroamphetamine (ADDERALL) 10 MG  tablet; Take 1 tablet (10 mg total) by mouth 2 (two) times daily. As needed for symptoms of ADHD  Dispense: 60 tablet; Refill: 0  4. Encounter for long-term (current) use of medications UDS due at next office visit in 3 months   General Counseling: Jamie Meadows verbalizes understanding of the findings of todays visit and agrees with plan of treatment. I have discussed any further diagnostic evaluation that may be needed or ordered today. We also reviewed her medications today. she has been encouraged to call the office with any questions or concerns that should arise related to todays visit.    No orders of the defined types were placed in this encounter.   Meds ordered this encounter  Medications   amphetamine-dextroamphetamine (ADDERALL XR) 20 MG 24 hr capsule    Sig: Take 1 capsule (20 mg total) by mouth daily.    Dispense:  30 capsule    Refill:  0    Fill for november   amphetamine-dextroamphetamine (ADDERALL XR) 20 MG 24 hr capsule    Sig: Take 1 capsule (20 mg total) by mouth daily.    Dispense:  30 capsule    Refill:  0    Fill for december   amphetamine-dextroamphetamine (ADDERALL XR) 20 MG 24 hr capsule    Sig: Take 1 capsule (20 mg total) by mouth daily.    Dispense:  30 capsule    Refill:  0    Fill for january   amphetamine-dextroamphetamine (ADDERALL) 10 MG tablet    Sig: Take 1 tablet (10 mg total) by mouth 2 (two) times daily. As needed for symptoms of ADHD    Dispense:  60 tablet    Refill:  0    Fill for november   amphetamine-dextroamphetamine (ADDERALL) 10 MG tablet    Sig: Take 1 tablet (10 mg total) by mouth 2 (two) times daily. As needed for symptoms of ADHD    Dispense:  60 tablet    Refill:  0    Fill for december   amphetamine-dextroamphetamine (ADDERALL) 10 MG tablet    Sig: Take 1 tablet (10 mg total) by mouth 2 (two) times daily. As needed for symptoms of ADHD    Dispense:  60 tablet    Refill:  0    Fill for january    Return in about 3 months  (around 07/19/2022) for F/U, ADHD med check, Jamie Meadows  PCP.   Total time spent:30 Minutes Time spent includes review of chart, medications, test results, and follow up plan with the patient.   [place] Controlled Substance Database was reviewed by me.  This patient was seen by Lisseth Brazeau, FNP-Sallyanne Kuster in collaboration with Dr. Beverely RisenFozia Khan as a part of collaborative care agreement.   Jamie Gravois R. Tedd SiasAbernathy, MSN, FNP-C Internal medicine

## 2022-06-05 ENCOUNTER — Encounter: Payer: Self-pay | Admitting: Dermatology

## 2022-06-05 ENCOUNTER — Ambulatory Visit (INDEPENDENT_AMBULATORY_CARE_PROVIDER_SITE_OTHER): Payer: 59 | Admitting: Dermatology

## 2022-06-05 VITALS — BP 121/77 | HR 81

## 2022-06-05 DIAGNOSIS — L988 Other specified disorders of the skin and subcutaneous tissue: Secondary | ICD-10-CM

## 2022-06-05 NOTE — Patient Instructions (Signed)
Due to recent changes in healthcare laws, you may see results of your pathology and/or laboratory studies on MyChart before the doctors have had a chance to review them. We understand that in some cases there may be results that are confusing or concerning to you. Please understand that not all results are received at the same time and often the doctors may need to interpret multiple results in order to provide you with the best plan of care or course of treatment. Therefore, we ask that you please give us 2 business days to thoroughly review all your results before contacting the office for clarification. Should we see a critical lab result, you will be contacted sooner.   If You Need Anything After Your Visit  If you have any questions or concerns for your doctor, please call our main line at [phone] and press option 4 to reach your doctor's medical assistant. If no one answers, please leave a voicemail as directed and we will return your call as soon as possible. Messages left after 4 pm will be answered the following business day.   You may also send us a message via MyChart. We typically respond to MyChart messages within 1-2 business days.  For prescription refills, please ask your pharmacy to contact our office. Our fax number is [phone].  If you have an urgent issue when the clinic is closed that cannot wait until the next business day, you can page your doctor at the number below.    Please note that while we do our best to be available for urgent issues outside of office hours, we are not available 24/7.   If you have an urgent issue and are unable to reach us, you may choose to seek medical care at your doctor's office, retail clinic, urgent care center, or emergency room.  If you have a medical emergency, please immediately call 911 or go to the emergency department.  Pager Numbers  - Dr. Kowalski: [phone]  - Dr. Moye: [phone]  - Dr. Stewart:  [phone]  In the event of inclement weather, please call our main line at [phone] for an update on the status of any delays or closures.  Dermatology Medication Tips: Please keep the boxes that topical medications come in in order to help keep track of the instructions about where and how to use these. Pharmacies typically print the medication instructions only on the boxes and not directly on the medication tubes.   If your medication is too expensive, please contact our office at [phone] option 4 or send us a message through MyChart.   We are unable to tell what your co-pay for medications will be in advance as this is different depending on your insurance coverage. However, we may be able to find a substitute medication at lower cost or fill out paperwork to get insurance to cover a needed medication.   If a prior authorization is required to get your medication covered by your insurance company, please allow us 1-2 business days to complete this process.  Drug prices often vary depending on where the prescription is filled and some pharmacies may offer cheaper prices.  The website www.goodrx.com contains coupons for medications through different pharmacies. The prices here do not account for what the cost may be with help from insurance (it may be cheaper with your insurance), but the website can give you the price if you did not use any insurance.  - You can print the associated coupon and take it with   your prescription to the pharmacy.  - You may also stop by our office during regular business hours and pick up a GoodRx coupon card.  - If you need your prescription sent electronically to a different pharmacy, notify our office through [place] MyChart or by phone at [phone] option 4.     Si Usted Necesita Algo Despus de Su Visita  Tambin puede enviarnos un mensaje a travs de MyChart. Por lo general respondemos a los mensajes de MyChart en el transcurso de 1 a 2  das hbiles.  Para renovar recetas, por favor pida a su farmacia que se ponga en contacto con nuestra oficina. Nuestro nmero de fax es el [phone].  Si tiene un asunto urgente cuando la clnica est cerrada y que no puede esperar hasta el siguiente da hbil, puede llamar/localizar a su doctor(a) al nmero que aparece a continuacin.   Por favor, tenga en cuenta que aunque hacemos todo lo posible para estar disponibles para asuntos urgentes fuera del horario de oficina, no estamos disponibles las 24 horas del da, los 7 das de la semana.   Si tiene un problema urgente y no puede comunicarse con nosotros, puede optar por buscar atencin mdica  en el consultorio de su doctor(a), en una clnica privada, en un centro de atencin urgente o en una sala de emergencias.  Si tiene una emergencia mdica, por favor llame inmediatamente al 911 o vaya a la sala de emergencias.  Nmeros de bper  - Dr. Kowalski: [phone]  - Dra. Moye: [phone]  - Dra. Stewart: [phone]  En caso de inclemencias del tiempo, por favor llame a nuestra lnea principal al [phone] para una actualizacin sobre el estado de cualquier retraso o cierre.  Consejos para la medicacin en dermatologa: Por favor, guarde las cajas en las que vienen los medicamentos de uso tpico para ayudarle a seguir las instrucciones sobre dnde y cmo usarlos. Las farmacias generalmente imprimen las instrucciones del medicamento slo en las cajas y no directamente en los tubos del medicamento.   Si su medicamento es muy caro, por favor, pngase en contacto con nuestra oficina llamando al [phone] y presione la opcin 4 o envenos un mensaje a travs de MyChart.   No podemos decirle cul ser su copago por los medicamentos por adelantado ya que esto es diferente dependiendo de la cobertura de su seguro. Sin embargo, es posible que podamos encontrar un medicamento sustituto a menor costo o llenar un formulario para que el  seguro cubra el medicamento que se considera necesario.   Si se requiere una autorizacin previa para que su compaa de seguros cubra su medicamento, por favor permtanos de 1 a 2 das hbiles para completar este proceso.  Los precios de los medicamentos varan con frecuencia dependiendo del lugar de dnde se surte la receta y alguna farmacias pueden ofrecer precios ms baratos.  El sitio web www.goodrx.com tiene cupones para medicamentos de diferentes farmacias. Los precios aqu no tienen en cuenta lo que podra costar con la ayuda del seguro (puede ser ms barato con su seguro), pero el sitio web puede darle el precio si no utiliz ningn seguro.  - Puede imprimir el cupn correspondiente y llevarlo con su receta a la farmacia.  - Tambin puede pasar por nuestra oficina durante el horario de atencin regular y recoger una tarjeta de cupones de GoodRx.  - Si necesita que su receta se enve electrnicamente a una farmacia diferente, informe a nuestra oficina a travs de MyChart de [place]   o por telfono llamando al [phone] y presione la opcin 4.  

## 2022-06-05 NOTE — Progress Notes (Signed)
   Follow-Up Visit   Subjective  Jamie Meadows is a 47 y.o. female who presents for the following: Facial Elastosis (Here for Botox).   The following portions of the chart were reviewed this encounter and updated as appropriate:  Tobacco  Allergies  Meds  Problems  Med Hx  Surg Hx  Fam Hx      Review of Systems: No other skin or systemic complaints except as noted in HPI or Assessment and Plan.   Objective  Well appearing patient in no apparent distress; mood and affect are within normal limits.  A focused examination was performed including face. Relevant physical exam findings are noted in the Assessment and Plan.  face Rhytides and volume loss.       Assessment & Plan  Elastosis of skin face  Botox 30 units injected:  Frown complex, forehead  Botox Injection - face Location: See attached image  Informed consent: Discussed risks (infection, pain, bleeding, bruising, swelling, allergic reaction, paralysis of nearby muscles, eyelid droop, double vision, neck weakness, difficulty breathing, headache, undesirable cosmetic result, and need for additional treatment) and benefits of the procedure, as well as the alternatives.  Informed consent was obtained.  Preparation: The area was cleansed with alcohol.  Procedure Details:  Botox was injected into the dermis with a 30-gauge needle. Pressure applied to any bleeding. Ice packs offered for swelling.  Lot Number:  [id number] Expiration:  06/2024  Total Units Injected:  30  Plan: Tylenol may be used for headache.  Allow 2 weeks before returning to clinic for additional dosing as needed. Patient will call for any problems.    Return in about 3 months (around 09/04/2022) for Botox.  I, Emelia Salisbury, CMA, am acting as scribe for Forest Gleason, MD.  Documentation: I have reviewed the above documentation for accuracy and completeness, and I agree with the above.  Forest Gleason, MD

## 2022-07-19 ENCOUNTER — Encounter: Payer: Self-pay | Admitting: Nurse Practitioner

## 2022-07-19 ENCOUNTER — Ambulatory Visit: Payer: 59 | Admitting: Nurse Practitioner

## 2022-07-19 VITALS — BP 120/70 | HR 77 | Temp 98.0°F | Resp 16 | Ht 65.0 in | Wt 187.0 lb

## 2022-07-19 DIAGNOSIS — R5383 Other fatigue: Secondary | ICD-10-CM

## 2022-07-19 DIAGNOSIS — Z79899 Other long term (current) drug therapy: Secondary | ICD-10-CM

## 2022-07-19 DIAGNOSIS — R41840 Attention and concentration deficit: Secondary | ICD-10-CM | POA: Diagnosis not present

## 2022-07-19 LAB — POCT URINE DRUG SCREEN
POC Amphetamine UR: POSITIVE — AB
POC BENZODIAZEPINES UR: NOT DETECTED
POC Barbiturate UR: NOT DETECTED
POC Cocaine UR: NOT DETECTED
POC Ecstasy UR: NOT DETECTED
POC Marijuana UR: NOT DETECTED
POC Methadone UR: NOT DETECTED
POC Methamphetamine UR: NOT DETECTED
POC Opiate Ur: NOT DETECTED
POC Oxycodone UR: NOT DETECTED
POC PHENCYCLIDINE UR: NOT DETECTED
POC TRICYCLICS UR: NOT DETECTED

## 2022-07-19 MED ORDER — AMPHETAMINE-DEXTROAMPHET ER 20 MG PO CP24: 20.0000 mg | ORAL_CAPSULE | Freq: Every day | ORAL | 0 refills | Status: DC

## 2022-07-19 MED ORDER — AMPHETAMINE-DEXTROAMPHETAMINE 10 MG PO TABS: 10.0000 mg | ORAL_TABLET | Freq: Two times a day (BID) | ORAL | 0 refills | Status: DC

## 2022-07-19 NOTE — Progress Notes (Signed)
[facility] [place], [place] [address]  Internal MEDICINE  Office Visit Note  Patient Name: Jamie Meadows  [id number]  [id number]  Date of Service: 07/19/2022  Chief Complaint  Patient presents with   Follow-up   ADHD    HPI Jamie Meadows presents for a follow- up visit for ADHD, medication refills, and fatigue, ADHD -- current dose of adderall still effective per patient. Denies any palpitations or other adverse side effects of the medication. Heart rate and BP are normal.  UDS done today  Fatigue -- baseline fatigue due to demanding home/work schedule. will have more time to rest over spring and summer break coming up    Current Medication: Outpatient Encounter Medications as of 07/19/2022  Medication Sig   cetirizine (ZYRTEC) 10 MG tablet Take 10 mg by mouth daily.   fluconazole (DIFLUCAN) 150 MG tablet Take one tab a day for 3 days   fluticasone (FLONASE) 50 MCG/ACT nasal spray Place into both nostrils daily.   levonorgestrel (MIRENA) 20 MCG/24HR IUD 1 each by Intrauterine route once.   Multiple Vitamin (MULTIVITAMIN) tablet Take 1 tablet by mouth daily.   [DISCONTINUED] amphetamine-dextroamphetamine (ADDERALL XR) 20 MG 24 hr capsule Take 1 capsule (20 mg total) by mouth daily.   [DISCONTINUED] amphetamine-dextroamphetamine (ADDERALL XR) 20 MG 24 hr capsule Take 1 capsule (20 mg total) by mouth daily.   [DISCONTINUED] amphetamine-dextroamphetamine (ADDERALL XR) 20 MG 24 hr capsule Take 1 capsule (20 mg total) by mouth daily.   [DISCONTINUED] amphetamine-dextroamphetamine (ADDERALL) 10 MG tablet Take 1 tablet (10 mg total) by mouth 2 (two) times daily. As needed for symptoms of ADHD   [DISCONTINUED] amphetamine-dextroamphetamine (ADDERALL) 10 MG tablet Take 1 tablet (10 mg total) by mouth 2 (two) times daily. As needed for symptoms of ADHD   [DISCONTINUED] amphetamine-dextroamphetamine (ADDERALL) 10 MG tablet Take 1 tablet (10 mg total) by mouth 2 (two)  times daily. As needed for symptoms of ADHD   [START ON 09/13/2022] amphetamine-dextroamphetamine (ADDERALL XR) 20 MG 24 hr capsule Take 1 capsule (20 mg total) by mouth daily.   [START ON 08/16/2022] amphetamine-dextroamphetamine (ADDERALL XR) 20 MG 24 hr capsule Take 1 capsule (20 mg total) by mouth daily.   amphetamine-dextroamphetamine (ADDERALL XR) 20 MG 24 hr capsule Take 1 capsule (20 mg total) by mouth daily.   [START ON 09/13/2022] amphetamine-dextroamphetamine (ADDERALL) 10 MG tablet Take 1 tablet (10 mg total) by mouth 2 (two) times daily. As needed for symptoms of ADHD   [START ON 08/16/2022] amphetamine-dextroamphetamine (ADDERALL) 10 MG tablet Take 1 tablet (10 mg total) by mouth 2 (two) times daily. As needed for symptoms of ADHD   amphetamine-dextroamphetamine (ADDERALL) 10 MG tablet Take 1 tablet (10 mg total) by mouth 2 (two) times daily. As needed for symptoms of ADHD   No facility-administered encounter medications on file as of 07/19/2022.    Surgical History: Past Surgical History:  Procedure Laterality Date   ABDOMINOPLASTY  2017   COLPOSCOPY  09/21/2015   WISDOM TOOTH EXTRACTION      Medical History: Past Medical History:  Diagnosis Date   ADHD    Allergic rhinitis    Fracture of clavicle with routine healing right   History of abnormal cervical Pap smear 2017   LGSIL with HRHPV   Sinusitis    Wrist fracture, closed    left    Family History: Family History  Problem Relation Age of Onset   Diabetes Mother    Kidney disease Mother  Allergies Father    Breast cancer Maternal Aunt 50   Diabetes Maternal Grandfather    Hypertension Maternal Grandfather    Cancer Sister     Social History   Socioeconomic History   Marital status: Divorced    Spouse name: Not on file   Number of children: 4   Years of education: 16   Highest education level: Not on file  Occupational History   Occupation: self emplyeed-[employer]  Tobacco Use   Smoking status:  Former   Smokeless tobacco: Never  Scientific laboratory technician Use: Never used  Substance and Sexual Activity   Alcohol use: Yes    Alcohol/week: 7.0 standard drinks of alcohol    Types: 7 Glasses of wine per week    Comment: occasionally   Drug use: Never   Sexual activity: Yes    Birth control/protection: I.U.D.  Other Topics Concern   Not on file  Social History Narrative   Not on file   Social Determinants of Health   Financial Resource Strain: Not on file  Food Insecurity: Not on file  Transportation Needs: Not on file  Physical Activity: Insufficiently Active (09/04/2017)   Exercise Vital Sign    Days of Exercise per Week: 4 days    Minutes of Exercise per Session: 30 min  Stress: No Stress Concern Present (09/04/2017)   [place]    Feeling of Stress : Not at all  Social Connections: Not on file  Intimate Partner Violence: Not on file      Review of Systems  Constitutional:  Positive for activity change (low energy) and fatigue. Negative for chills and unexpected weight change.  HENT:  Negative for congestion, rhinorrhea, sneezing and sore throat.   Respiratory: Negative.  Negative for cough, chest tightness and shortness of breath.   Cardiovascular: Negative.  Negative for chest pain and palpitations.  Gastrointestinal: Negative.  Negative for abdominal pain, constipation, diarrhea, nausea and vomiting.  Genitourinary:  Negative for dysuria and frequency.  Musculoskeletal: Negative.  Negative for arthralgias, back pain, joint swelling and neck pain.  Psychiatric/Behavioral:  Positive for sleep disturbance. Negative for behavioral problems (Depression), self-injury and suicidal ideas. The patient is not nervous/anxious.     Vital Signs: BP 120/70   Pulse 77   Temp 98 F (36.7 C)   Resp 16   Ht '5\' 5"'$  (1.651 m)   Wt 187 lb (84.8 kg)   SpO2 99%   BMI 31.12 kg/m    Physical Exam Vitals reviewed.   Constitutional:      General: She is not in acute distress.    Appearance: Normal appearance. She is obese. She is not ill-appearing.  HENT:     Head: Normocephalic and atraumatic.  Eyes:     Pupils: Pupils are equal, round, and reactive to light.  Cardiovascular:     Rate and Rhythm: Normal rate and regular rhythm.  Pulmonary:     Effort: Pulmonary effort is normal. No respiratory distress.  Neurological:     Mental Status: She is alert and oriented to person, place, and time.  Psychiatric:        Mood and Affect: Mood normal.        Behavior: Behavior normal.        Assessment/Plan: 1. Other fatigue Encouraged patient to consistently set aside time for herself.     2. Attention and concentration deficit Refills ordered x3 months, follow up for additional refills in  3 months  - amphetamine-dextroamphetamine (ADDERALL XR) 20 MG 24 hr capsule; Take 1 capsule (20 mg total) by mouth daily.  Dispense: 30 capsule; Refill: 0 - amphetamine-dextroamphetamine (ADDERALL XR) 20 MG 24 hr capsule; Take 1 capsule (20 mg total) by mouth daily.  Dispense: 30 capsule; Refill: 0 - amphetamine-dextroamphetamine (ADDERALL) 10 MG tablet; Take 1 tablet (10 mg total) by mouth 2 (two) times daily. As needed for symptoms of ADHD  Dispense: 60 tablet; Refill: 0 - amphetamine-dextroamphetamine (ADDERALL) 10 MG tablet; Take 1 tablet (10 mg total) by mouth 2 (two) times daily. As needed for symptoms of ADHD  Dispense: 60 tablet; Refill: 0 - amphetamine-dextroamphetamine (ADDERALL) 10 MG tablet; Take 1 tablet (10 mg total) by mouth 2 (two) times daily. As needed for symptoms of ADHD  Dispense: 60 tablet; Refill: 0 - amphetamine-dextroamphetamine (ADDERALL XR) 20 MG 24 hr capsule; Take 1 capsule (20 mg total) by mouth daily.  Dispense: 30 capsule; Refill: 0  3. Encounter for long-term (current) use of medications UDS done today, positive for amphetamine which is consistent with current prescriptions - POCT  Urine Drug Screen   General Counseling: Jamie Meadows verbalizes understanding of the findings of todays visit and agrees with plan of treatment. I have discussed any further diagnostic evaluation that may be needed or ordered today. We also reviewed her medications today. she has been encouraged to call the office with any questions or concerns that should arise related to todays visit.    Orders Placed This Encounter  Procedures   POCT Urine Drug Screen    Meds ordered this encounter  Medications   amphetamine-dextroamphetamine (ADDERALL XR) 20 MG 24 hr capsule    Sig: Take 1 capsule (20 mg total) by mouth daily.    Dispense:  30 capsule    Refill:  0    Fill for april   amphetamine-dextroamphetamine (ADDERALL XR) 20 MG 24 hr capsule    Sig: Take 1 capsule (20 mg total) by mouth daily.    Dispense:  30 capsule    Refill:  0    Fill for march   amphetamine-dextroamphetamine (ADDERALL) 10 MG tablet    Sig: Take 1 tablet (10 mg total) by mouth 2 (two) times daily. As needed for symptoms of ADHD    Dispense:  60 tablet    Refill:  0    Fill for april   amphetamine-dextroamphetamine (ADDERALL) 10 MG tablet    Sig: Take 1 tablet (10 mg total) by mouth 2 (two) times daily. As needed for symptoms of ADHD    Dispense:  60 tablet    Refill:  0    Fill for march   amphetamine-dextroamphetamine (ADDERALL) 10 MG tablet    Sig: Take 1 tablet (10 mg total) by mouth 2 (two) times daily. As needed for symptoms of ADHD    Dispense:  60 tablet    Refill:  0    Fill for january   amphetamine-dextroamphetamine (ADDERALL XR) 20 MG 24 hr capsule    Sig: Take 1 capsule (20 mg total) by mouth daily.    Dispense:  30 capsule    Refill:  0    Fill for november    Return in about 3 months (around 10/10/2022) for F/U, ADHD med check, [place] PCP.   Total time spent:30 Minutes Time spent includes review of chart, medications, test results, and follow up plan with the patient.   [place] Controlled  Substance Database was reviewed by me.  This patient was seen  by Jonetta Osgood, FNP-C in collaboration with Dr. Clayborn Bigness as a part of collaborative care agreement.   Mikalah Skyles R. Valetta Fuller, MSN, FNP-C Internal medicine

## 2022-10-10 ENCOUNTER — Ambulatory Visit: Payer: 59 | Admitting: Nurse Practitioner

## 2022-10-10 ENCOUNTER — Encounter: Payer: Self-pay | Admitting: Nurse Practitioner

## 2022-10-10 VITALS — BP 130/80 | HR 85 | Temp 97.8°F | Resp 16 | Ht 65.0 in | Wt 183.0 lb

## 2022-10-10 DIAGNOSIS — M545 Low back pain, unspecified: Secondary | ICD-10-CM | POA: Diagnosis not present

## 2022-10-10 DIAGNOSIS — R5383 Other fatigue: Secondary | ICD-10-CM | POA: Diagnosis not present

## 2022-10-10 DIAGNOSIS — R41840 Attention and concentration deficit: Secondary | ICD-10-CM | POA: Diagnosis not present

## 2022-10-10 DIAGNOSIS — J309 Allergic rhinitis, unspecified: Secondary | ICD-10-CM

## 2022-10-10 DIAGNOSIS — G8929 Other chronic pain: Secondary | ICD-10-CM | POA: Diagnosis not present

## 2022-10-10 MED ORDER — AMPHETAMINE-DEXTROAMPHET ER 20 MG PO CP24
20.0000 mg | ORAL_CAPSULE | Freq: Every day | ORAL | 0 refills | Status: DC
Start: 2022-11-07 — End: 2023-01-10

## 2022-10-10 MED ORDER — AMPHETAMINE-DEXTROAMPHETAMINE 10 MG PO TABS
10.0000 mg | ORAL_TABLET | Freq: Two times a day (BID) | ORAL | 0 refills | Status: DC
Start: 2022-11-07 — End: 2023-01-10

## 2022-10-10 MED ORDER — AMPHETAMINE-DEXTROAMPHET ER 20 MG PO CP24
20.0000 mg | ORAL_CAPSULE | Freq: Every day | ORAL | 0 refills | Status: DC
Start: 2022-10-10 — End: 2023-01-10

## 2022-10-10 MED ORDER — CETIRIZINE HCL 10 MG PO TABS
10.0000 mg | ORAL_TABLET | Freq: Every day | ORAL | 2 refills | Status: AC
Start: 2022-10-10 — End: ?

## 2022-10-10 MED ORDER — AMPHETAMINE-DEXTROAMPHET ER 20 MG PO CP24
20.0000 mg | ORAL_CAPSULE | Freq: Every day | ORAL | 0 refills | Status: DC
Start: 2022-12-05 — End: 2023-01-10

## 2022-10-10 MED ORDER — AMPHETAMINE-DEXTROAMPHETAMINE 10 MG PO TABS
10.0000 mg | ORAL_TABLET | Freq: Two times a day (BID) | ORAL | 0 refills | Status: DC
Start: 2022-10-10 — End: 2023-01-10

## 2022-10-10 MED ORDER — METHOCARBAMOL 500 MG PO TABS: 500.0000 mg | ORAL_TABLET | Freq: Two times a day (BID) | ORAL | 3 refills | Status: DC | PRN

## 2022-10-10 MED ORDER — AMPHETAMINE-DEXTROAMPHETAMINE 10 MG PO TABS
10.0000 mg | ORAL_TABLET | Freq: Two times a day (BID) | ORAL | 0 refills | Status: DC
Start: 2022-12-05 — End: 2023-01-10

## 2022-10-10 NOTE — Progress Notes (Signed)
[facility] [address] [place], [place] [address]  Internal MEDICINE  Office Visit Note  Patient Name: Jamie Meadows  [id number]  [id number]  Date of Service: 10/10/2022  Chief Complaint  Patient presents with   Follow-up   ADHD    HPI Jamie Meadows presents for a follow-up visit for back pain, ADHD, and fatigue Back pain -- has been taking OTC ibuprofen, and tried a muscle relaxant before which helped, wants  to get prescription ADHD -- current dose of adderall remains effective. Heart rate and blood pressure are normal. Denies any palpitations or other adverse side effects of the medications. Due for refills Fatigue-- baseline fatigue due to demanding home/work schedule. will have more time to rest over summer break coming up     Current Medication: Outpatient Encounter Medications as of 10/10/2022  Medication Sig   fluconazole (DIFLUCAN) 150 MG tablet Take one tab a day for 3 days   fluticasone (FLONASE) 50 MCG/ACT nasal spray Place into both nostrils daily.   levonorgestrel (MIRENA) 20 MCG/24HR IUD 1 each by Intrauterine route once.   methocarbamol (ROBAXIN) 500 MG tablet Take 1 tablet (500 mg total) by mouth 2 (two) times daily as needed for muscle spasms.   Multiple Vitamin (MULTIVITAMIN) tablet Take 1 tablet by mouth daily.   [DISCONTINUED] amphetamine-dextroamphetamine (ADDERALL XR) 20 MG 24 hr capsule Take 1 capsule (20 mg total) by mouth daily.   [DISCONTINUED] amphetamine-dextroamphetamine (ADDERALL XR) 20 MG 24 hr capsule Take 1 capsule (20 mg total) by mouth daily.   [DISCONTINUED] amphetamine-dextroamphetamine (ADDERALL XR) 20 MG 24 hr capsule Take 1 capsule (20 mg total) by mouth daily.   [DISCONTINUED] amphetamine-dextroamphetamine (ADDERALL) 10 MG tablet Take 1 tablet (10 mg total) by mouth 2 (two) times daily. As needed for symptoms of ADHD   [DISCONTINUED] amphetamine-dextroamphetamine (ADDERALL) 10 MG tablet Take 1 tablet (10 mg total) by mouth 2  (two) times daily. As needed for symptoms of ADHD   [DISCONTINUED] amphetamine-dextroamphetamine (ADDERALL) 10 MG tablet Take 1 tablet (10 mg total) by mouth 2 (two) times daily. As needed for symptoms of ADHD   [DISCONTINUED] cetirizine (ZYRTEC) 10 MG tablet Take 10 mg by mouth daily.   [START ON 12/05/2022] amphetamine-dextroamphetamine (ADDERALL XR) 20 MG 24 hr capsule Take 1 capsule (20 mg total) by mouth daily.   [START ON 11/07/2022] amphetamine-dextroamphetamine (ADDERALL XR) 20 MG 24 hr capsule Take 1 capsule (20 mg total) by mouth daily.   amphetamine-dextroamphetamine (ADDERALL XR) 20 MG 24 hr capsule Take 1 capsule (20 mg total) by mouth daily.   [START ON 12/05/2022] amphetamine-dextroamphetamine (ADDERALL) 10 MG tablet Take 1 tablet (10 mg total) by mouth 2 (two) times daily. As needed for symptoms of ADHD   [START ON 11/07/2022] amphetamine-dextroamphetamine (ADDERALL) 10 MG tablet Take 1 tablet (10 mg total) by mouth 2 (two) times daily. As needed for symptoms of ADHD   amphetamine-dextroamphetamine (ADDERALL) 10 MG tablet Take 1 tablet (10 mg total) by mouth 2 (two) times daily. As needed for symptoms of ADHD   cetirizine (ZYRTEC) 10 MG tablet Take 1 tablet (10 mg total) by mouth daily.   No facility-administered encounter medications on file as of 10/10/2022.    Surgical History: Past Surgical History:  Procedure Laterality Date   ABDOMINOPLASTY  2017   COLPOSCOPY  09/21/2015   WISDOM TOOTH EXTRACTION      Medical History: Past Medical History:  Diagnosis Date   ADHD    Allergic rhinitis    Fracture of clavicle  with routine healing right   History of abnormal cervical Pap smear 2017   LGSIL with HRHPV   Sinusitis    Wrist fracture, closed    left    Family History: Family History  Problem Relation Age of Onset   Diabetes Mother    Kidney disease Mother    Allergies Father    Breast cancer Maternal Aunt 10   Diabetes Maternal Grandfather    Hypertension Maternal  Grandfather    Cancer Sister     Social History   Socioeconomic History   Marital status: Divorced    Spouse name: Not on file   Number of children: 4   Years of education: 16   Highest education level: Not on file  Occupational History   Occupation: self emplyeed-[employer]  Tobacco Use   Smoking status: Former   Smokeless tobacco: Never  Building services engineer Use: Never used  Substance and Sexual Activity   Alcohol use: Yes    Alcohol/week: 7.0 standard drinks of alcohol    Types: 7 Glasses of wine per week    Comment: occasionally   Drug use: Never   Sexual activity: Yes    Birth control/protection: I.U.D.  Other Topics Concern   Not on file  Social History Narrative   Not on file   Social Determinants of Health   Financial Resource Strain: Not on file  Food Insecurity: Not on file  Transportation Needs: Not on file  Physical Activity: Insufficiently Active (09/04/2017)   Exercise Vital Sign    Days of Exercise per Week: 4 days    Minutes of Exercise per Session: 30 min  Stress: No Stress Concern Present (09/04/2017)   [employer] of Occupational Health - Occupational Stress Questionnaire    Feeling of Stress : Not at all  Social Connections: Not on file  Intimate Partner Violence: Not on file      Review of Systems  Constitutional:  Positive for activity change (low energy) and fatigue. Negative for chills and unexpected weight change.  HENT:  Negative for congestion, rhinorrhea, sneezing and sore throat.   Respiratory: Negative.  Negative for cough, chest tightness and shortness of breath.   Cardiovascular: Negative.  Negative for chest pain and palpitations.  Gastrointestinal: Negative.  Negative for abdominal pain, constipation, diarrhea, nausea and vomiting.  Genitourinary:  Negative for dysuria and frequency.  Musculoskeletal:  Positive for arthralgias and back pain. Negative for joint swelling and neck pain.  Psychiatric/Behavioral:  Positive for  sleep disturbance. Negative for behavioral problems (Depression), self-injury and suicidal ideas. The patient is not nervous/anxious.     Vital Signs: BP 130/80   Pulse 85   Temp 97.8 F (36.6 C)   Resp 16   Ht 5\' 5"  (1.651 m)   Wt 183 lb (83 kg)   SpO2 99%   BMI 30.45 kg/m    Physical Exam Vitals reviewed.  Constitutional:      General: She is not in acute distress.    Appearance: Normal appearance. She is obese. She is not ill-appearing.  HENT:     Head: Normocephalic and atraumatic.  Eyes:     Pupils: Pupils are equal, round, and reactive to light.  Cardiovascular:     Rate and Rhythm: Normal rate and regular rhythm.  Pulmonary:     Effort: Pulmonary effort is normal. No respiratory distress.  Neurological:     Mental Status: She is alert and oriented to person, place, and time.  Psychiatric:  Mood and Affect: Mood normal.        Behavior: Behavior normal.        Assessment/Plan: 1. Chronic midline low back pain without sciatica Take methocarbamol as prescribed.  - methocarbamol (ROBAXIN) 500 MG tablet; Take 1 tablet (500 mg total) by mouth 2 (two) times daily as needed for muscle spasms.  Dispense: 60 tablet; Refill: 3  2. Chronic allergic rhinitis Continue cetirizine as prescribed. - cetirizine (ZYRTEC) 10 MG tablet; Take 1 tablet (10 mg total) by mouth daily.  Dispense: 30 tablet; Refill: 2  3. Other fatigue Encouraged patient to consistently set aside time for herself.     4. Attention and concentration deficit Refills x3 months ordered, take adderall as prescribed. Follow up in 3 months for additional refills  - amphetamine-dextroamphetamine (ADDERALL XR) 20 MG 24 hr capsule; Take 1 capsule (20 mg total) by mouth daily.  Dispense: 30 capsule; Refill: 0 - amphetamine-dextroamphetamine (ADDERALL) 10 MG tablet; Take 1 tablet (10 mg total) by mouth 2 (two) times daily. As needed for symptoms of ADHD  Dispense: 60 tablet; Refill: 0 -  amphetamine-dextroamphetamine (ADDERALL) 10 MG tablet; Take 1 tablet (10 mg total) by mouth 2 (two) times daily. As needed for symptoms of ADHD  Dispense: 60 tablet; Refill: 0 - amphetamine-dextroamphetamine (ADDERALL) 10 MG tablet; Take 1 tablet (10 mg total) by mouth 2 (two) times daily. As needed for symptoms of ADHD  Dispense: 60 tablet; Refill: 0 - amphetamine-dextroamphetamine (ADDERALL XR) 20 MG 24 hr capsule; Take 1 capsule (20 mg total) by mouth daily.  Dispense: 30 capsule; Refill: 0 - amphetamine-dextroamphetamine (ADDERALL XR) 20 MG 24 hr capsule; Take 1 capsule (20 mg total) by mouth daily.  Dispense: 30 capsule; Refill: 0   General Counseling: Jamie Meadows verbalizes understanding of the findings of todays visit and agrees with plan of treatment. I have discussed any further diagnostic evaluation that may be needed or ordered today. We also reviewed her medications today. she has been encouraged to call the office with any questions or concerns that should arise related to todays visit.    No orders of the defined types were placed in this encounter.   Meds ordered this encounter  Medications   methocarbamol (ROBAXIN) 500 MG tablet    Sig: Take 1 tablet (500 mg total) by mouth 2 (two) times daily as needed for muscle spasms.    Dispense:  60 tablet    Refill:  3    Please fill new script today.   amphetamine-dextroamphetamine (ADDERALL XR) 20 MG 24 hr capsule    Sig: Take 1 capsule (20 mg total) by mouth daily.    Dispense:  30 capsule    Refill:  0    Fill for july   amphetamine-dextroamphetamine (ADDERALL) 10 MG tablet    Sig: Take 1 tablet (10 mg total) by mouth 2 (two) times daily. As needed for symptoms of ADHD    Dispense:  60 tablet    Refill:  0    Fill for july   amphetamine-dextroamphetamine (ADDERALL) 10 MG tablet    Sig: Take 1 tablet (10 mg total) by mouth 2 (two) times daily. As needed for symptoms of ADHD    Dispense:  60 tablet    Refill:  0    Fill for  june   amphetamine-dextroamphetamine (ADDERALL) 10 MG tablet    Sig: Take 1 tablet (10 mg total) by mouth 2 (two) times daily. As needed for symptoms of ADHD    Dispense:  60 tablet    Refill:  0    Fill for may   amphetamine-dextroamphetamine (ADDERALL XR) 20 MG 24 hr capsule    Sig: Take 1 capsule (20 mg total) by mouth daily.    Dispense:  30 capsule    Refill:  0    Fill for june   amphetamine-dextroamphetamine (ADDERALL XR) 20 MG 24 hr capsule    Sig: Take 1 capsule (20 mg total) by mouth daily.    Dispense:  30 capsule    Refill:  0    Fill for may   cetirizine (ZYRTEC) 10 MG tablet    Sig: Take 1 tablet (10 mg total) by mouth daily.    Dispense:  30 tablet    Refill:  2    Return in about 3 months (around 01/03/2023).   Total time spent:30 Minutes Time spent includes review of chart, medications, test results, and follow up plan with the patient.   [place] Controlled Substance Database was reviewed by me.  This patient was seen by Sallyanne Kuster, FNP-C in collaboration with Dr. Beverely Risen as a part of collaborative care agreement.   Stockton Nunley R. Tedd Sias, MSN, FNP-C Internal medicine

## 2022-11-02 ENCOUNTER — Other Ambulatory Visit: Payer: Self-pay

## 2022-11-02 ENCOUNTER — Encounter: Payer: Self-pay | Admitting: Nurse Practitioner

## 2022-11-02 MED ORDER — PREDNISONE 10 MG (21) PO TBPK: ORAL_TABLET | ORAL | 0 refills | Status: DC

## 2023-01-10 ENCOUNTER — Ambulatory Visit: Payer: 59 | Admitting: Nurse Practitioner

## 2023-01-10 ENCOUNTER — Encounter: Payer: Self-pay | Admitting: Nurse Practitioner

## 2023-01-10 VITALS — BP 110/70 | HR 96 | Temp 97.7°F | Resp 16 | Ht 65.0 in | Wt 183.8 lb

## 2023-01-10 DIAGNOSIS — Z1212 Encounter for screening for malignant neoplasm of rectum: Secondary | ICD-10-CM

## 2023-01-10 DIAGNOSIS — Z1211 Encounter for screening for malignant neoplasm of colon: Secondary | ICD-10-CM

## 2023-01-10 DIAGNOSIS — G8929 Other chronic pain: Secondary | ICD-10-CM | POA: Diagnosis not present

## 2023-01-10 DIAGNOSIS — M545 Low back pain, unspecified: Secondary | ICD-10-CM

## 2023-01-10 DIAGNOSIS — R41840 Attention and concentration deficit: Secondary | ICD-10-CM

## 2023-01-10 MED ORDER — AMPHETAMINE-DEXTROAMPHET ER 20 MG PO CP24
20.0000 mg | ORAL_CAPSULE | Freq: Every day | ORAL | 0 refills | Status: DC
Start: 2023-03-07 — End: 2023-04-08

## 2023-01-10 MED ORDER — AMPHETAMINE-DEXTROAMPHET ER 20 MG PO CP24
20.0000 mg | ORAL_CAPSULE | Freq: Every day | ORAL | 0 refills | Status: DC
Start: 2023-02-07 — End: 2023-04-08

## 2023-01-10 MED ORDER — AMPHETAMINE-DEXTROAMPHETAMINE 10 MG PO TABS
10.0000 mg | ORAL_TABLET | Freq: Two times a day (BID) | ORAL | 0 refills | Status: DC
Start: 2023-03-07 — End: 2023-04-08

## 2023-01-10 MED ORDER — AMPHETAMINE-DEXTROAMPHETAMINE 10 MG PO TABS
10.0000 mg | ORAL_TABLET | Freq: Two times a day (BID) | ORAL | 0 refills | Status: DC
Start: 2023-01-10 — End: 2023-04-08

## 2023-01-10 MED ORDER — AMPHETAMINE-DEXTROAMPHET ER 20 MG PO CP24: 20.0000 mg | ORAL_CAPSULE | Freq: Every day | ORAL | 0 refills | Status: DC

## 2023-01-10 MED ORDER — AMPHETAMINE-DEXTROAMPHETAMINE 10 MG PO TABS
10.0000 mg | ORAL_TABLET | Freq: Two times a day (BID) | ORAL | 0 refills | Status: DC
Start: 2023-02-07 — End: 2023-04-08

## 2023-01-10 NOTE — Progress Notes (Signed)
[facility] [address] [place], [place] [address]  Internal MEDICINE  Office Visit Note  Patient Name: Jamie Meadows  [id number]  [id number]  Date of Service: 01/10/2023  Chief Complaint  Patient presents with   Follow-up   Quality Metric Gaps    Colonoscopy    HPI Jamie Meadows presents for a follow-up visit for back pain, ADHD, and CRC screening. ADHD -- current dose remains effective. BP and heart rate are normal. Denies any palpitations or other adverse side effects of the medication. Due for refills.  CRC screening due, opted for cologuard. Chronic back pain -- methocarbamol prescribed at her previous office visit which is helping along with prn OTC tylenol or ibuprofen.     Current Medication: Outpatient Encounter Medications as of 01/10/2023  Medication Sig   cetirizine (ZYRTEC) 10 MG tablet Take 1 tablet (10 mg total) by mouth daily.   fluticasone (FLONASE) 50 MCG/ACT nasal spray Place into both nostrils daily.   levonorgestrel (MIRENA) 20 MCG/24HR IUD 1 each by Intrauterine route once.   methocarbamol (ROBAXIN) 500 MG tablet Take 1 tablet (500 mg total) by mouth 2 (two) times daily as needed for muscle spasms.   Multiple Vitamin (MULTIVITAMIN) tablet Take 1 tablet by mouth daily.   [DISCONTINUED] amphetamine-dextroamphetamine (ADDERALL XR) 20 MG 24 hr capsule Take 1 capsule (20 mg total) by mouth daily.   [DISCONTINUED] amphetamine-dextroamphetamine (ADDERALL XR) 20 MG 24 hr capsule Take 1 capsule (20 mg total) by mouth daily.   [DISCONTINUED] amphetamine-dextroamphetamine (ADDERALL XR) 20 MG 24 hr capsule Take 1 capsule (20 mg total) by mouth daily.   [DISCONTINUED] amphetamine-dextroamphetamine (ADDERALL) 10 MG tablet Take 1 tablet (10 mg total) by mouth 2 (two) times daily. As needed for symptoms of ADHD   [DISCONTINUED] amphetamine-dextroamphetamine (ADDERALL) 10 MG tablet Take 1 tablet (10 mg total) by mouth 2 (two) times daily. As needed for symptoms  of ADHD   [DISCONTINUED] amphetamine-dextroamphetamine (ADDERALL) 10 MG tablet Take 1 tablet (10 mg total) by mouth 2 (two) times daily. As needed for symptoms of ADHD   [DISCONTINUED] fluconazole (DIFLUCAN) 150 MG tablet Take one tab a day for 3 days   [DISCONTINUED] predniSONE (STERAPRED UNI-PAK 21 TAB) 10 MG (21) TBPK tablet Use as directed for 6 days   [START ON 03/07/2023] amphetamine-dextroamphetamine (ADDERALL XR) 20 MG 24 hr capsule Take 1 capsule (20 mg total) by mouth daily.   [START ON 02/07/2023] amphetamine-dextroamphetamine (ADDERALL XR) 20 MG 24 hr capsule Take 1 capsule (20 mg total) by mouth daily.   amphetamine-dextroamphetamine (ADDERALL XR) 20 MG 24 hr capsule Take 1 capsule (20 mg total) by mouth daily.   [START ON 03/07/2023] amphetamine-dextroamphetamine (ADDERALL) 10 MG tablet Take 1 tablet (10 mg total) by mouth 2 (two) times daily. As needed for symptoms of ADHD   [START ON 02/07/2023] amphetamine-dextroamphetamine (ADDERALL) 10 MG tablet Take 1 tablet (10 mg total) by mouth 2 (two) times daily. As needed for symptoms of ADHD   amphetamine-dextroamphetamine (ADDERALL) 10 MG tablet Take 1 tablet (10 mg total) by mouth 2 (two) times daily. As needed for symptoms of ADHD   No facility-administered encounter medications on file as of 01/10/2023.    Surgical History: Past Surgical History:  Procedure Laterality Date   ABDOMINOPLASTY  2017   COLPOSCOPY  09/21/2015   WISDOM TOOTH EXTRACTION      Medical History: Past Medical History:  Diagnosis Date   ADHD    Allergic rhinitis    Fracture of clavicle with routine  healing right   History of abnormal cervical Pap smear 2017   LGSIL with HRHPV   Sinusitis    Wrist fracture, closed    left    Family History: Family History  Problem Relation Age of Onset   Diabetes Mother    Kidney disease Mother    Allergies Father    Breast cancer Maternal Aunt 34   Diabetes Maternal Grandfather    Hypertension Maternal  Grandfather    Cancer Sister     Social History   Socioeconomic History   Marital status: Divorced    Spouse name: Not on file   Number of children: 4   Years of education: 16   Highest education level: Not on file  Occupational History   Occupation: self emplyeed-[employer]  Tobacco Use   Smoking status: Former   Smokeless tobacco: Never  Vaping Use   Vaping status: Never Used  Substance and Sexual Activity   Alcohol use: Yes    Alcohol/week: 7.0 standard drinks of alcohol    Types: 7 Glasses of wine per week    Comment: occasionally   Drug use: Never   Sexual activity: Yes    Birth control/protection: I.U.D.  Other Topics Concern   Not on file  Social History Narrative   Not on file   Social Determinants of Health   Financial Resource Strain: Not on file  Food Insecurity: Not on file  Transportation Needs: Not on file  Physical Activity: Insufficiently Active (09/04/2017)   Exercise Vital Sign    Days of Exercise per Week: 4 days    Minutes of Exercise per Session: 30 min  Stress: No Stress Concern Present (09/04/2017)   [employer] of Occupational Health - Occupational Stress Questionnaire    Feeling of Stress : Not at all  Social Connections: Not on file  Intimate Partner Violence: Not on file      Review of Systems  Constitutional:  Positive for activity change (low energy) and fatigue. Negative for chills and unexpected weight change.  HENT:  Negative for congestion, rhinorrhea, sneezing and sore throat.   Respiratory: Negative.  Negative for cough, chest tightness and shortness of breath.   Cardiovascular: Negative.  Negative for chest pain and palpitations.  Gastrointestinal: Negative.  Negative for abdominal pain, constipation, diarrhea, nausea and vomiting.  Genitourinary:  Negative for dysuria and frequency.  Musculoskeletal:  Positive for arthralgias and back pain. Negative for joint swelling and neck pain.  Psychiatric/Behavioral:  Positive for  sleep disturbance. Negative for behavioral problems (Depression), self-injury and suicidal ideas. The patient is not nervous/anxious.     Vital Signs: BP 110/70   Pulse 96   Temp 97.7 F (36.5 C)   Resp 16   Ht 5\' 5"  (1.651 m)   Wt 183 lb 12.8 oz (83.4 kg)   SpO2 97%   BMI 30.59 kg/m    Physical Exam Vitals reviewed.  Constitutional:      General: She is not in acute distress.    Appearance: Normal appearance. She is obese. She is not ill-appearing.  HENT:     Head: Normocephalic and atraumatic.  Eyes:     Pupils: Pupils are equal, round, and reactive to light.  Cardiovascular:     Rate and Rhythm: Normal rate and regular rhythm.  Pulmonary:     Effort: Pulmonary effort is normal. No respiratory distress.  Neurological:     Mental Status: She is alert and oriented to person, place, and time.  Psychiatric:  Mood and Affect: Mood normal.        Behavior: Behavior normal.        Assessment/Plan: 1. Chronic midline low back pain without sciatica Continue prn OTC medications for pain and continue methocarbamol prn as prescribed.   2. Attention and concentration deficit Continue adderall as prescribed. Follow up in 3 months for additional refills - amphetamine-dextroamphetamine (ADDERALL) 10 MG tablet; Take 1 tablet (10 mg total) by mouth 2 (two) times daily. As needed for symptoms of ADHD  Dispense: 60 tablet; Refill: 0 - amphetamine-dextroamphetamine (ADDERALL) 10 MG tablet; Take 1 tablet (10 mg total) by mouth 2 (two) times daily. As needed for symptoms of ADHD  Dispense: 60 tablet; Refill: 0 - amphetamine-dextroamphetamine (ADDERALL) 10 MG tablet; Take 1 tablet (10 mg total) by mouth 2 (two) times daily. As needed for symptoms of ADHD  Dispense: 60 tablet; Refill: 0 - amphetamine-dextroamphetamine (ADDERALL XR) 20 MG 24 hr capsule; Take 1 capsule (20 mg total) by mouth daily.  Dispense: 30 capsule; Refill: 0 - amphetamine-dextroamphetamine (ADDERALL XR) 20 MG 24  hr capsule; Take 1 capsule (20 mg total) by mouth daily.  Dispense: 30 capsule; Refill: 0 - amphetamine-dextroamphetamine (ADDERALL XR) 20 MG 24 hr capsule; Take 1 capsule (20 mg total) by mouth daily.  Dispense: 30 capsule; Refill: 0  3. Screening for colorectal cancer Cologuard ordered - Cologuard   General Counseling: Jamie Meadows verbalizes understanding of the findings of todays visit and agrees with plan of treatment. I have discussed any further diagnostic evaluation that may be needed or ordered today. We also reviewed her medications today. she has been encouraged to call the office with any questions or concerns that should arise related to todays visit.    Orders Placed This Encounter  Procedures   Cologuard    Meds ordered this encounter  Medications   amphetamine-dextroamphetamine (ADDERALL) 10 MG tablet    Sig: Take 1 tablet (10 mg total) by mouth 2 (two) times daily. As needed for symptoms of ADHD    Dispense:  60 tablet    Refill:  0    Fill for October   amphetamine-dextroamphetamine (ADDERALL) 10 MG tablet    Sig: Take 1 tablet (10 mg total) by mouth 2 (two) times daily. As needed for symptoms of ADHD    Dispense:  60 tablet    Refill:  0    Fill for September   amphetamine-dextroamphetamine (ADDERALL) 10 MG tablet    Sig: Take 1 tablet (10 mg total) by mouth 2 (two) times daily. As needed for symptoms of ADHD    Dispense:  60 tablet    Refill:  0    Fill for august   amphetamine-dextroamphetamine (ADDERALL XR) 20 MG 24 hr capsule    Sig: Take 1 capsule (20 mg total) by mouth daily.    Dispense:  30 capsule    Refill:  0    Fill for October   amphetamine-dextroamphetamine (ADDERALL XR) 20 MG 24 hr capsule    Sig: Take 1 capsule (20 mg total) by mouth daily.    Dispense:  30 capsule    Refill:  0    Fill for september   amphetamine-dextroamphetamine (ADDERALL XR) 20 MG 24 hr capsule    Sig: Take 1 capsule (20 mg total) by mouth daily.    Dispense:  30  capsule    Refill:  0    Fill for august    Return in about 12 weeks (around 04/04/2023) for F/U, ADHD med  check, Jlen Wintle PCP, need UDS at next visit.   Total time spent:30 Minutes Time spent includes review of chart, medications, test results, and follow up plan with the patient.   [place] Controlled Substance Database was reviewed by me.  This patient was seen by Sallyanne Kuster, FNP-C in collaboration with Dr. Beverely Risen as a part of collaborative care agreement.   Rhegan Trunnell R. Tedd Sias, MSN, FNP-C Internal medicine

## 2023-02-03 DIAGNOSIS — Z1212 Encounter for screening for malignant neoplasm of rectum: Secondary | ICD-10-CM | POA: Diagnosis not present

## 2023-02-03 DIAGNOSIS — Z1211 Encounter for screening for malignant neoplasm of colon: Secondary | ICD-10-CM | POA: Diagnosis not present

## 2023-02-04 ENCOUNTER — Encounter: Payer: Self-pay | Admitting: Nurse Practitioner

## 2023-02-10 LAB — COLOGUARD: COLOGUARD: NEGATIVE

## 2023-02-14 ENCOUNTER — Telehealth: Payer: Self-pay

## 2023-02-14 NOTE — Progress Notes (Signed)
Cologuard is negative.

## 2023-02-14 NOTE — Telephone Encounter (Signed)
-----   Message from Sallyanne Kuster sent at 02/14/2023  9:40 AM EDT ----- Cologuard is negative

## 2023-02-14 NOTE — Telephone Encounter (Signed)
Patient notified

## 2023-03-12 ENCOUNTER — Encounter: Payer: Self-pay | Admitting: Dermatology

## 2023-03-12 ENCOUNTER — Ambulatory Visit (INDEPENDENT_AMBULATORY_CARE_PROVIDER_SITE_OTHER): Payer: Self-pay | Admitting: Dermatology

## 2023-03-12 VITALS — BP 124/79

## 2023-03-12 DIAGNOSIS — L988 Other specified disorders of the skin and subcutaneous tissue: Secondary | ICD-10-CM

## 2023-03-12 NOTE — Patient Instructions (Signed)

## 2023-03-12 NOTE — Progress Notes (Signed)
   Follow-Up Visit   Subjective  Jamie Meadows is a 47 y.o. female who presents for the following: Botox for facial elastosis  The following portions of the chart were reviewed this encounter and updated as appropriate: medications, allergies, medical history  Review of Systems:  No other skin or systemic complaints except as noted in HPI or Assessment and Plan.  Objective  Well appearing patient in no apparent distress; mood and affect are within normal limits.  A focused examination was performed of the face.  Relevant physical exam findings are noted in the Assessment and Plan.  Injection map photo     Assessment & Plan    Facial Elastosis Botox 30 units injected today to: - Frown complex 20 units - Forehead 10 units Location: frown complex, forehead  Informed consent: Discussed risks (infection, pain, bleeding, bruising, swelling, allergic reaction, paralysis of nearby muscles, eyelid droop, double vision, neck weakness, difficulty breathing, headache, undesirable cosmetic result, and need for additional treatment) and benefits of the procedure, as well as the alternatives.  Informed consent was obtained.  Preparation: The area was cleansed with alcohol.  Procedure Details:  Botox was injected into the dermis with a 30-gauge needle. Pressure applied to any bleeding. Ice packs offered for swelling.  Lot Number:  [id number] Expiration:  10/2024  Total Units Injected:  30  Plan: Tylenol may be used for headache.  Allow 2 weeks before returning to clinic for additional dosing as needed. Patient will call for any problems.  Return for 3-54m Botox.  I, Ardis Rowan, RMA, am acting as scribe for Armida Sans, MD .   Documentation: I have reviewed the above documentation for accuracy and completeness, and I agree with the above.  Armida Sans, MD

## 2023-03-15 ENCOUNTER — Encounter: Payer: Self-pay | Admitting: Nurse Practitioner

## 2023-03-31 IMAGING — MG MM DIGITAL SCREENING BILAT W/ TOMO AND CAD
8 series · 8 of 24 positions shown · non-contrast
Comparison: Previous exam(s).

CLINICAL DATA: Screening.

EXAM:
DIGITAL SCREENING BILATERAL MAMMOGRAM WITH TOMOSYNTHESIS AND CAD
TECHNIQUE: Bilateral screening digital craniocaudal and mediolateral oblique
mammograms were obtained. Bilateral screening digital breast
tomosynthesis was performed. The images were evaluated with
computer-aided detection.

[L MLO synth-2D]
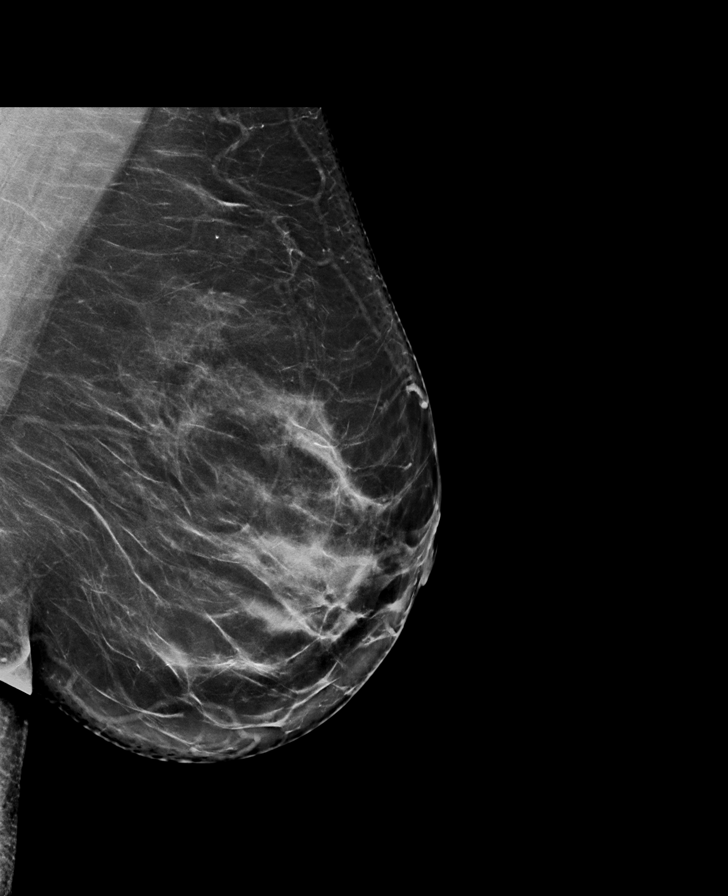

[R CC synth-2D]
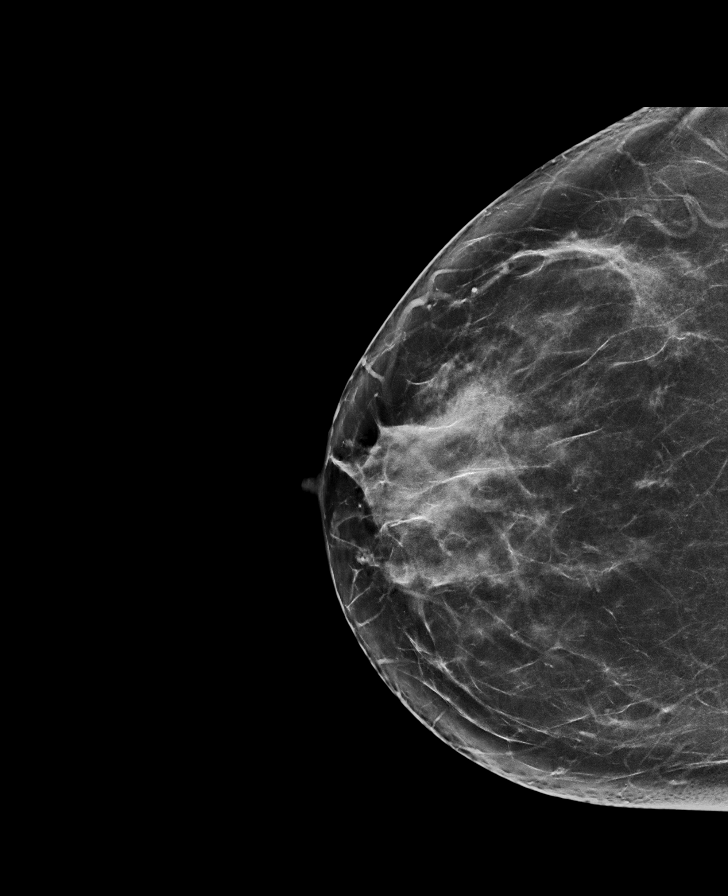

[R MLO synth-2D]
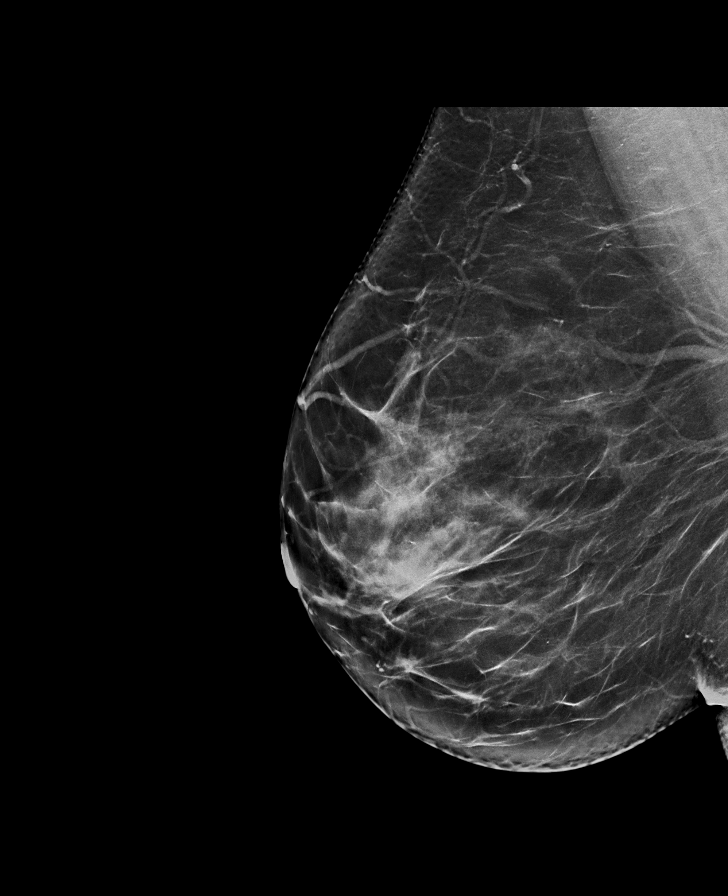

[L CC synth-2D]
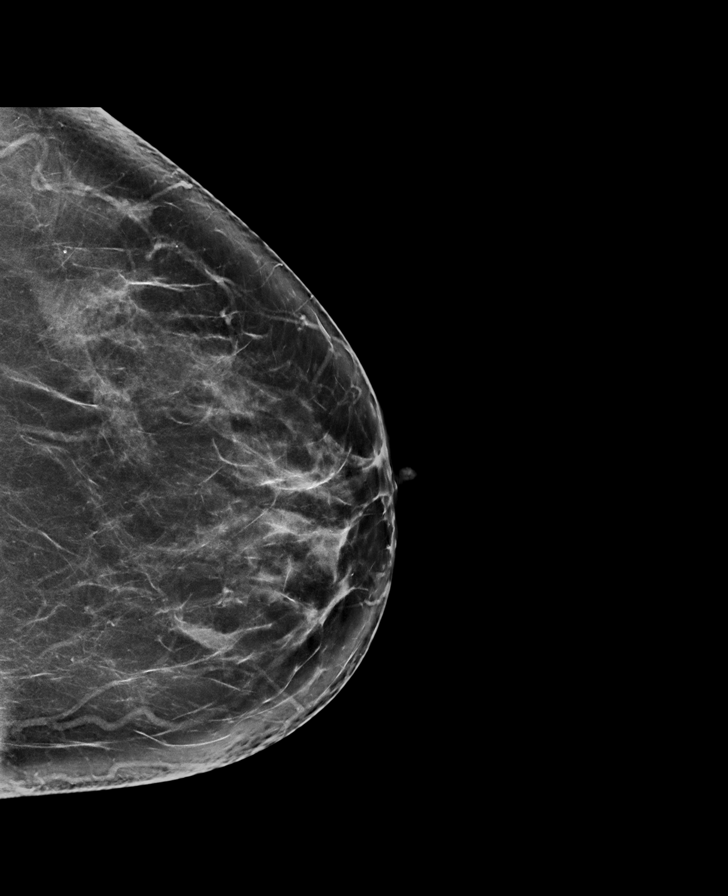

[R CC tomo · tomo slice 42/83.0]
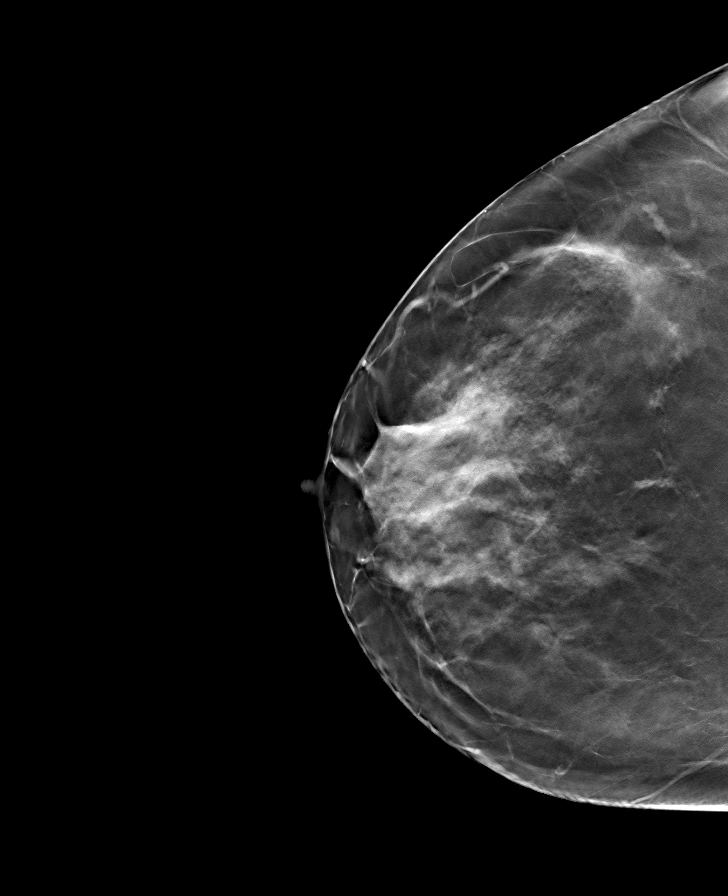

[L CC tomo · tomo slice 45/89.0]
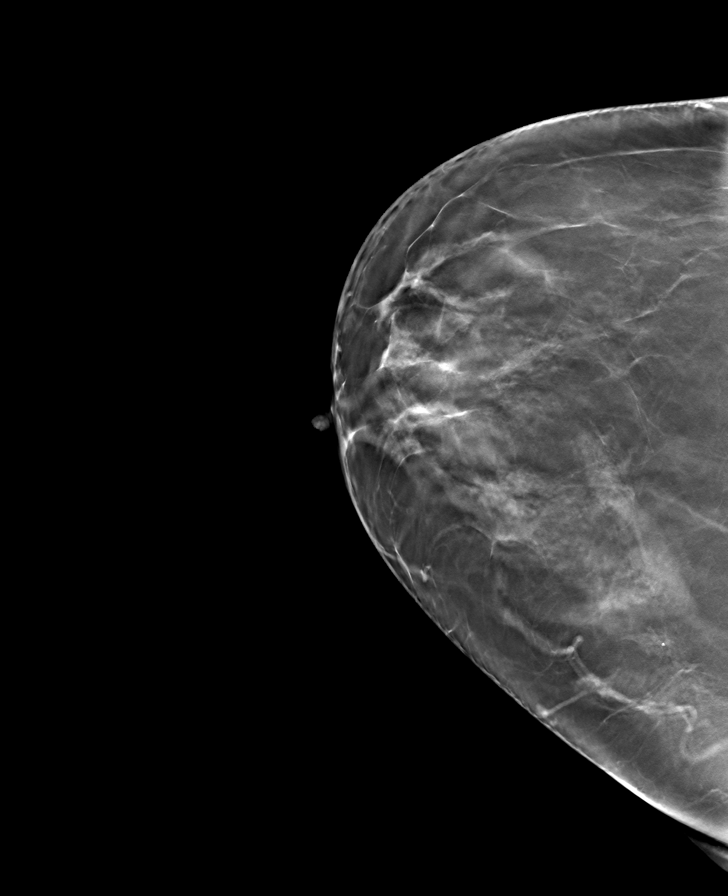

[R MLO tomo · tomo slice 47/92.0]
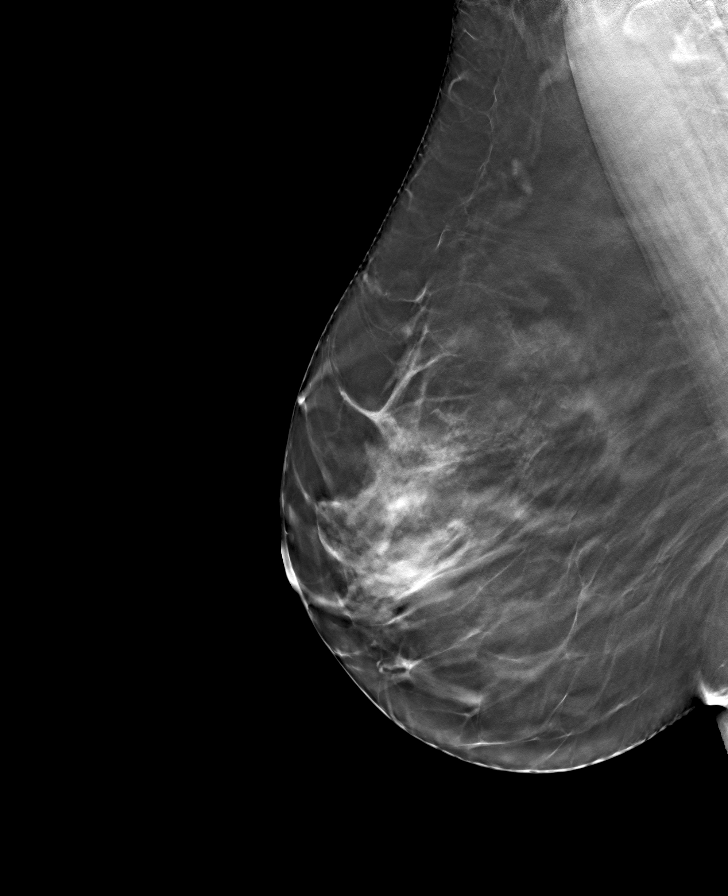

[L MLO tomo · tomo slice 47/92.0]
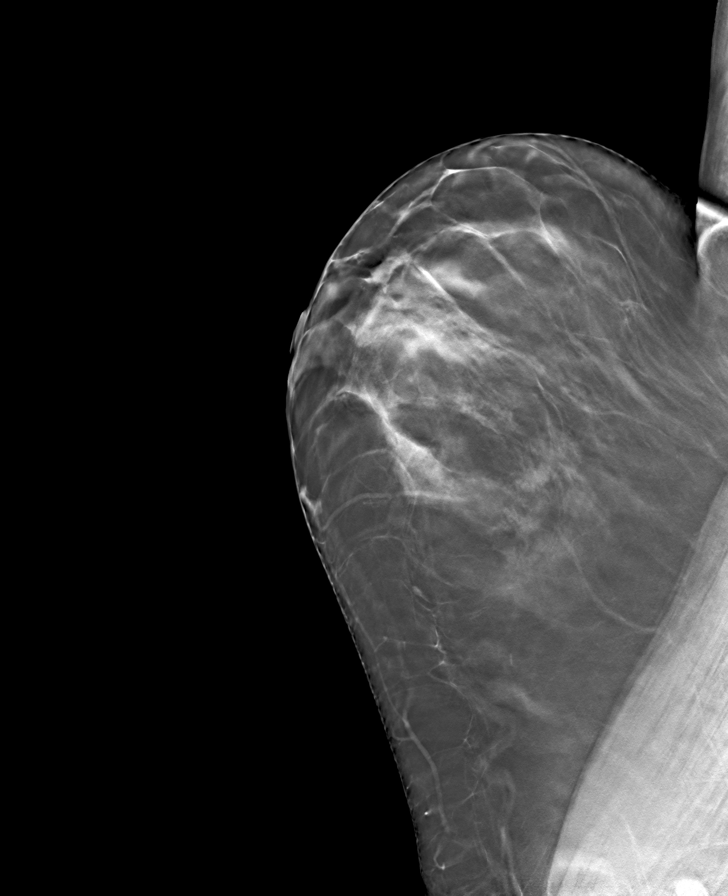

[8 of 24 positions shown; findings below may reference images not displayed]

ACR Breast Density Category c: The breast tissue is heterogeneously
dense, which may obscure small masses.
FINDINGS: There are no findings suspicious for malignancy.
IMPRESSION: No mammographic evidence of malignancy. A result letter of this
screening mammogram will be mailed directly to the patient.

RECOMMENDATION:
Screening mammogram in one year. (Code:Q3-W-BC3)

BI-RADS CATEGORY  1: Negative.

## 2023-04-08 ENCOUNTER — Ambulatory Visit (INDEPENDENT_AMBULATORY_CARE_PROVIDER_SITE_OTHER): Payer: Self-pay | Admitting: Nurse Practitioner

## 2023-04-08 ENCOUNTER — Encounter: Payer: Self-pay | Admitting: Nurse Practitioner

## 2023-04-08 VITALS — BP 105/85 | HR 83 | Temp 98.1°F | Resp 16 | Ht 64.0 in | Wt 186.6 lb

## 2023-04-08 DIAGNOSIS — Z79899 Other long term (current) drug therapy: Secondary | ICD-10-CM

## 2023-04-08 DIAGNOSIS — M545 Low back pain, unspecified: Secondary | ICD-10-CM

## 2023-04-08 DIAGNOSIS — R41840 Attention and concentration deficit: Secondary | ICD-10-CM

## 2023-04-08 DIAGNOSIS — G8929 Other chronic pain: Secondary | ICD-10-CM

## 2023-04-08 LAB — POCT URINE DRUG SCREEN
Methylenedioxyamphetamine: NOT DETECTED
POC Amphetamine UR: POSITIVE — AB
POC BENZODIAZEPINES UR: NOT DETECTED
POC Barbiturate UR: NOT DETECTED
POC Cocaine UR: NOT DETECTED
POC Ecstasy UR: NOT DETECTED
POC Marijuana UR: NOT DETECTED
POC Methadone UR: NOT DETECTED
POC Methamphetamine UR: NOT DETECTED
POC Opiate Ur: NOT DETECTED
POC Oxycodone UR: NOT DETECTED
POC PHENCYCLIDINE UR: NOT DETECTED
POC TRICYCLICS UR: NOT DETECTED

## 2023-04-08 MED ORDER — AMPHETAMINE-DEXTROAMPHETAMINE 10 MG PO TABS
10.0000 mg | ORAL_TABLET | Freq: Two times a day (BID) | ORAL | 0 refills | Status: DC
Start: 2023-04-08 — End: 2023-07-02

## 2023-04-08 MED ORDER — AMPHETAMINE-DEXTROAMPHETAMINE 10 MG PO TABS
10.0000 mg | ORAL_TABLET | Freq: Two times a day (BID) | ORAL | 0 refills | Status: DC
Start: 2023-05-06 — End: 2023-07-02

## 2023-04-08 MED ORDER — AMPHETAMINE-DEXTROAMPHET ER 20 MG PO CP24
20.0000 mg | ORAL_CAPSULE | Freq: Every day | ORAL | 0 refills | Status: DC
Start: 2023-05-06 — End: 2023-07-02

## 2023-04-08 MED ORDER — AMPHETAMINE-DEXTROAMPHET ER 20 MG PO CP24
20.0000 mg | ORAL_CAPSULE | Freq: Every day | ORAL | 0 refills | Status: DC
Start: 2023-04-08 — End: 2023-07-02

## 2023-04-08 MED ORDER — AMPHETAMINE-DEXTROAMPHETAMINE 10 MG PO TABS
10.0000 mg | ORAL_TABLET | Freq: Two times a day (BID) | ORAL | 0 refills | Status: DC
Start: 2023-06-03 — End: 2023-07-02

## 2023-04-08 MED ORDER — AMPHETAMINE-DEXTROAMPHET ER 20 MG PO CP24
20.0000 mg | ORAL_CAPSULE | Freq: Every day | ORAL | 0 refills | Status: DC
Start: 2023-06-03 — End: 2023-07-02

## 2023-04-08 NOTE — Progress Notes (Signed)
[facility] [address] [place], [place] [address]  Internal MEDICINE  Office Visit Note  Patient Name: Jamie Meadows  [id number]  [id number]  Date of Service: 04/08/2023  Chief Complaint  Patient presents with   Follow-up   ADHD    HPI Jamie Meadows presents for a follow-up visit for back pain, ADHD, and refills ADHD -- BP and heart rate are normal. Current dose remains effective. Denies any palpitations or other adverse side effects of the medication. Due for refills.  Chronic low back pain -- taking prn methocarbamol, no worsening pain or issues.  UDS due today    Current Medication: Outpatient Encounter Medications as of 04/08/2023  Medication Sig   cetirizine (ZYRTEC) 10 MG tablet Take 1 tablet (10 mg total) by mouth daily.   fluticasone (FLONASE) 50 MCG/ACT nasal spray Place into both nostrils daily.   levonorgestrel (MIRENA) 20 MCG/24HR IUD 1 each by Intrauterine route once.   methocarbamol (ROBAXIN) 500 MG tablet Take 1 tablet (500 mg total) by mouth 2 (two) times daily as needed for muscle spasms.   Multiple Vitamin (MULTIVITAMIN) tablet Take 1 tablet by mouth daily.   [DISCONTINUED] amphetamine-dextroamphetamine (ADDERALL XR) 20 MG 24 hr capsule Take 1 capsule (20 mg total) by mouth daily.   [DISCONTINUED] amphetamine-dextroamphetamine (ADDERALL XR) 20 MG 24 hr capsule Take 1 capsule (20 mg total) by mouth daily.   [DISCONTINUED] amphetamine-dextroamphetamine (ADDERALL XR) 20 MG 24 hr capsule Take 1 capsule (20 mg total) by mouth daily.   [DISCONTINUED] amphetamine-dextroamphetamine (ADDERALL) 10 MG tablet Take 1 tablet (10 mg total) by mouth 2 (two) times daily. As needed for symptoms of ADHD   [DISCONTINUED] amphetamine-dextroamphetamine (ADDERALL) 10 MG tablet Take 1 tablet (10 mg total) by mouth 2 (two) times daily. As needed for symptoms of ADHD   [DISCONTINUED] amphetamine-dextroamphetamine (ADDERALL) 10 MG tablet Take 1 tablet (10 mg total) by mouth  2 (two) times daily. As needed for symptoms of ADHD   amphetamine-dextroamphetamine (ADDERALL XR) 20 MG 24 hr capsule Take 1 capsule (20 mg total) by mouth daily.   amphetamine-dextroamphetamine (ADDERALL XR) 20 MG 24 hr capsule Take 1 capsule (20 mg total) by mouth daily.   amphetamine-dextroamphetamine (ADDERALL XR) 20 MG 24 hr capsule Take 1 capsule (20 mg total) by mouth daily.   amphetamine-dextroamphetamine (ADDERALL) 10 MG tablet Take 1 tablet (10 mg total) by mouth 2 (two) times daily. As needed for symptoms of ADHD   amphetamine-dextroamphetamine (ADDERALL) 10 MG tablet Take 1 tablet (10 mg total) by mouth 2 (two) times daily. As needed for symptoms of ADHD   amphetamine-dextroamphetamine (ADDERALL) 10 MG tablet Take 1 tablet (10 mg total) by mouth 2 (two) times daily. As needed for symptoms of ADHD   No facility-administered encounter medications on file as of 04/08/2023.    Surgical History: Past Surgical History:  Procedure Laterality Date   ABDOMINOPLASTY  2017   COLPOSCOPY  09/21/2015   WISDOM TOOTH EXTRACTION      Medical History: Past Medical History:  Diagnosis Date   ADHD    Allergic rhinitis    Fracture of clavicle with routine healing right   History of abnormal cervical Pap smear 2017   LGSIL with HRHPV   Sinusitis    Wrist fracture, closed    left    Family History: Family History  Problem Relation Age of Onset   Diabetes Mother    Kidney disease Mother    Allergies Father    Breast cancer Maternal Aunt 11  Diabetes Maternal Grandfather    Hypertension Maternal Grandfather    Cancer Sister     Social History   Socioeconomic History   Marital status: Divorced    Spouse name: Not on file   Number of children: 4   Years of education: 16   Highest education level: Not on file  Occupational History   Occupation: self emplyeed-[employer]  Tobacco Use   Smoking status: Former   Smokeless tobacco: Never  Vaping Use   Vaping status: Never Used   Substance and Sexual Activity   Alcohol use: Yes    Alcohol/week: 7.0 standard drinks of alcohol    Types: 7 Glasses of wine per week    Comment: occasionally   Drug use: Never   Sexual activity: Yes    Birth control/protection: I.U.D.  Other Topics Concern   Not on file  Social History Narrative   Not on file   Social Drivers of Health   Financial Resource Strain: Not on file  Food Insecurity: Not on file  Transportation Needs: Not on file  Physical Activity: Insufficiently Active (09/04/2017)   Exercise Vital Sign    Days of Exercise per Week: 4 days    Minutes of Exercise per Session: 30 min  Stress: No Stress Concern Present (09/04/2017)   [employer] of Occupational Health - Occupational Stress Questionnaire    Feeling of Stress : Not at all  Social Connections: Not on file  Intimate Partner Violence: Not on file      Review of Systems  Constitutional:  Positive for activity change (low energy) and fatigue. Negative for chills and unexpected weight change.  HENT:  Negative for congestion, rhinorrhea, sneezing and sore throat.   Respiratory: Negative.  Negative for cough, chest tightness and shortness of breath.   Cardiovascular: Negative.  Negative for chest pain and palpitations.  Gastrointestinal: Negative.  Negative for abdominal pain, constipation, diarrhea, nausea and vomiting.  Genitourinary:  Negative for dysuria and frequency.  Musculoskeletal:  Positive for arthralgias and back pain. Negative for joint swelling and neck pain.  Psychiatric/Behavioral:  Positive for decreased concentration and sleep disturbance. Negative for behavioral problems (Depression), self-injury and suicidal ideas. The patient is not nervous/anxious.     Vital Signs: BP 105/85   Pulse 83   Temp 98.1 F (36.7 C)   Resp 16   Ht 5\' 4"  (1.626 m)   Wt 186 lb 9.6 oz (84.6 kg)   SpO2 97%   BMI 32.03 kg/m    Physical Exam Vitals reviewed.  Constitutional:      General: She  is not in acute distress.    Appearance: Normal appearance. She is obese. She is not ill-appearing.  HENT:     Head: Normocephalic and atraumatic.  Eyes:     Pupils: Pupils are equal, round, and reactive to light.  Cardiovascular:     Rate and Rhythm: Normal rate and regular rhythm.  Pulmonary:     Effort: Pulmonary effort is normal. No respiratory distress.  Neurological:     Mental Status: She is alert and oriented to person, place, and time.  Psychiatric:        Mood and Affect: Mood normal.        Behavior: Behavior normal.        Assessment/Plan: 1. Chronic midline low back pain without sciatica (Primary) Continue prn methocarbamol as prescribed.   2. Attention and concentration deficit Continue adderall as prescribed. Follow up in 3 months for additional refills.  - amphetamine-dextroamphetamine (  ADDERALL) 10 MG tablet; Take 1 tablet (10 mg total) by mouth 2 (two) times daily. As needed for symptoms of ADHD  Dispense: 60 tablet; Refill: 0 - amphetamine-dextroamphetamine (ADDERALL) 10 MG tablet; Take 1 tablet (10 mg total) by mouth 2 (two) times daily. As needed for symptoms of ADHD  Dispense: 60 tablet; Refill: 0 - amphetamine-dextroamphetamine (ADDERALL XR) 20 MG 24 hr capsule; Take 1 capsule (20 mg total) by mouth daily.  Dispense: 30 capsule; Refill: 0 - amphetamine-dextroamphetamine (ADDERALL XR) 20 MG 24 hr capsule; Take 1 capsule (20 mg total) by mouth daily.  Dispense: 30 capsule; Refill: 0 - amphetamine-dextroamphetamine (ADDERALL XR) 20 MG 24 hr capsule; Take 1 capsule (20 mg total) by mouth daily.  Dispense: 30 capsule; Refill: 0 - amphetamine-dextroamphetamine (ADDERALL) 10 MG tablet; Take 1 tablet (10 mg total) by mouth 2 (two) times daily. As needed for symptoms of ADHD  Dispense: 60 tablet; Refill: 0  3. Encounter for long-term (current) use of medications UDS done today, positive for amphetamine which is consistent with current prescriptions.  - POCT Urine  Drug Screen   General Counseling: Talene verbalizes understanding of the findings of todays visit and agrees with plan of treatment. I have discussed any further diagnostic evaluation that may be needed or ordered today. We also reviewed her medications today. she has been encouraged to call the office with any questions or concerns that should arise related to todays visit.    Orders Placed This Encounter  Procedures   POCT Urine Drug Screen    Meds ordered this encounter  Medications   amphetamine-dextroamphetamine (ADDERALL) 10 MG tablet    Sig: Take 1 tablet (10 mg total) by mouth 2 (two) times daily. As needed for symptoms of ADHD    Dispense:  60 tablet    Refill:  0    Fill for January   amphetamine-dextroamphetamine (ADDERALL) 10 MG tablet    Sig: Take 1 tablet (10 mg total) by mouth 2 (two) times daily. As needed for symptoms of ADHD    Dispense:  60 tablet    Refill:  0    Fill for december   amphetamine-dextroamphetamine (ADDERALL XR) 20 MG 24 hr capsule    Sig: Take 1 capsule (20 mg total) by mouth daily.    Dispense:  30 capsule    Refill:  0    Fill for december   amphetamine-dextroamphetamine (ADDERALL XR) 20 MG 24 hr capsule    Sig: Take 1 capsule (20 mg total) by mouth daily.    Dispense:  30 capsule    Refill:  0    Fill for november   amphetamine-dextroamphetamine (ADDERALL XR) 20 MG 24 hr capsule    Sig: Take 1 capsule (20 mg total) by mouth daily.    Dispense:  30 capsule    Refill:  0    Fill for august   amphetamine-dextroamphetamine (ADDERALL) 10 MG tablet    Sig: Take 1 tablet (10 mg total) by mouth 2 (two) times daily. As needed for symptoms of ADHD    Dispense:  60 tablet    Refill:  0    Fill for november    Return in about 3 months (around 07/02/2023) for F/U, ADHD med check, Tayona Sarnowski PCP.   Total time spent:30 Minutes Time spent includes review of chart, medications, test results, and follow up plan with the patient.   [place] Controlled  Substance Database was reviewed by me.  This patient was seen by Sallyanne Kuster, FNP-C  in collaboration with Dr. Beverely Risen as a part of collaborative care agreement.   Lillianah Swartzentruber R. Tedd Sias, MSN, FNP-C Internal medicine

## 2023-05-22 ENCOUNTER — Ambulatory Visit: Payer: 59

## 2023-05-22 DIAGNOSIS — S86112A Strain of other muscle(s) and tendon(s) of posterior muscle group at lower leg level, left leg, initial encounter: Secondary | ICD-10-CM | POA: Diagnosis not present

## 2023-06-23 ENCOUNTER — Encounter: Payer: Self-pay | Admitting: Nurse Practitioner

## 2023-07-02 ENCOUNTER — Encounter: Payer: Self-pay | Admitting: Nurse Practitioner

## 2023-07-02 ENCOUNTER — Ambulatory Visit: Payer: 59 | Admitting: Nurse Practitioner

## 2023-07-02 VITALS — BP 112/60 | HR 91 | Temp 98.7°F | Resp 16 | Ht 64.0 in | Wt 188.0 lb

## 2023-07-02 DIAGNOSIS — G8929 Other chronic pain: Secondary | ICD-10-CM

## 2023-07-02 DIAGNOSIS — R41840 Attention and concentration deficit: Secondary | ICD-10-CM

## 2023-07-02 DIAGNOSIS — G479 Sleep disorder, unspecified: Secondary | ICD-10-CM

## 2023-07-02 DIAGNOSIS — M545 Low back pain, unspecified: Secondary | ICD-10-CM | POA: Diagnosis not present

## 2023-07-02 MED ORDER — AMPHETAMINE-DEXTROAMPHETAMINE 10 MG PO TABS: 10.0000 mg | ORAL_TABLET | Freq: Two times a day (BID) | ORAL | 0 refills | Status: DC

## 2023-07-02 MED ORDER — AMPHETAMINE-DEXTROAMPHET ER 20 MG PO CP24: 20.0000 mg | ORAL_CAPSULE | Freq: Every day | ORAL | 0 refills | Status: DC

## 2023-07-02 NOTE — Progress Notes (Unsigned)
 [facility] [address] [place], [place] [address]  Internal MEDICINE  Office Visit Note  Patient Name: Jamie Meadows  [id number]  [id number]  Date of Service: 07/02/2023  Chief Complaint  Patient presents with   Follow-up    HPI Jamie Meadows presents for a follow-up visit for ADHD, chronic back pain and trouble sleeping.  ADHD -- current dose is effective. BP and heart rate are normal. Denies palpitations or other adverse side effects of the medication. Due for refills. UDS done at her previous visit.  Chronic back pain -- takes prn methocarbamol, no issues.  Trouble sleeping -- melatonin is not helping anymore, reports tylenol PM helps sometimes. Wants to know what else she can try OTC     Current Medication: Outpatient Encounter Medications as of 07/02/2023  Medication Sig   cetirizine (ZYRTEC) 10 MG tablet Take 1 tablet (10 mg total) by mouth daily.   fluticasone (FLONASE) 50 MCG/ACT nasal spray Place into both nostrils daily.   levonorgestrel (MIRENA) 20 MCG/24HR IUD 1 each by Intrauterine route once.   methocarbamol (ROBAXIN) 500 MG tablet Take 1 tablet (500 mg total) by mouth 2 (two) times daily as needed for muscle spasms.   Multiple Vitamin (MULTIVITAMIN) tablet Take 1 tablet by mouth daily.   [DISCONTINUED] amphetamine-dextroamphetamine (ADDERALL XR) 20 MG 24 hr capsule Take 1 capsule (20 mg total) by mouth daily.   [DISCONTINUED] amphetamine-dextroamphetamine (ADDERALL XR) 20 MG 24 hr capsule Take 1 capsule (20 mg total) by mouth daily.   [DISCONTINUED] amphetamine-dextroamphetamine (ADDERALL XR) 20 MG 24 hr capsule Take 1 capsule (20 mg total) by mouth daily.   [DISCONTINUED] amphetamine-dextroamphetamine (ADDERALL) 10 MG tablet Take 1 tablet (10 mg total) by mouth 2 (two) times daily. As needed for symptoms of ADHD   [DISCONTINUED] amphetamine-dextroamphetamine (ADDERALL) 10 MG tablet Take 1 tablet (10 mg total) by mouth 2 (two) times daily. As needed for  symptoms of ADHD   [DISCONTINUED] amphetamine-dextroamphetamine (ADDERALL) 10 MG tablet Take 1 tablet (10 mg total) by mouth 2 (two) times daily. As needed for symptoms of ADHD   [START ON 08/27/2023] amphetamine-dextroamphetamine (ADDERALL XR) 20 MG 24 hr capsule Take 1 capsule (20 mg total) by mouth daily.   [START ON 07/30/2023] amphetamine-dextroamphetamine (ADDERALL XR) 20 MG 24 hr capsule Take 1 capsule (20 mg total) by mouth daily.   amphetamine-dextroamphetamine (ADDERALL XR) 20 MG 24 hr capsule Take 1 capsule (20 mg total) by mouth daily.   [START ON 08/27/2023] amphetamine-dextroamphetamine (ADDERALL) 10 MG tablet Take 1 tablet (10 mg total) by mouth 2 (two) times daily. As needed for symptoms of ADHD   [START ON 07/30/2023] amphetamine-dextroamphetamine (ADDERALL) 10 MG tablet Take 1 tablet (10 mg total) by mouth 2 (two) times daily. As needed for symptoms of ADHD   amphetamine-dextroamphetamine (ADDERALL) 10 MG tablet Take 1 tablet (10 mg total) by mouth 2 (two) times daily. As needed for symptoms of ADHD   No facility-administered encounter medications on file as of 07/02/2023.    Surgical History: Past Surgical History:  Procedure Laterality Date   ABDOMINOPLASTY  2017   COLPOSCOPY  09/21/2015   WISDOM TOOTH EXTRACTION      Medical History: Past Medical History:  Diagnosis Date   ADHD    Allergic rhinitis    Fracture of clavicle with routine healing right   History of abnormal cervical Pap smear 2017   LGSIL with HRHPV   Sinusitis    Wrist fracture, closed    left  Family History: Family History  Problem Relation Age of Onset   Diabetes Mother    Kidney disease Mother    Allergies Father    Breast cancer Maternal Aunt 76   Diabetes Maternal Grandfather    Hypertension Maternal Grandfather    Cancer Sister     Social History   Socioeconomic History   Marital status: Divorced    Spouse name: Not on file   Number of children: 4   Years of education: 16    Highest education level: Not on file  Occupational History   Occupation: self emplyeed-[employer]  Tobacco Use   Smoking status: Former   Smokeless tobacco: Never  Advertising account planner   Vaping status: Never Used  Substance and Sexual Activity   Alcohol use: Yes    Alcohol/week: 7.0 standard drinks of alcohol    Types: 7 Glasses of wine per week    Comment: occasionally   Drug use: Never   Sexual activity: Yes    Birth control/protection: I.U.D.  Other Topics Concern   Not on file  Social History Narrative   Not on file   Social Drivers of Health   Financial Resource Strain: Not on file  Food Insecurity: Not on file  Transportation Needs: Not on file  Physical Activity: Insufficiently Active (09/04/2017)   Exercise Vital Sign    Days of Exercise per Week: 4 days    Minutes of Exercise per Session: 30 min  Stress: No Stress Concern Present (09/04/2017)   [employer] of Occupational Health - Occupational Stress Questionnaire    Feeling of Stress : Not at all  Social Connections: Not on file  Intimate Partner Violence: Not on file      Review of Systems  Constitutional:  Positive for activity change (low energy) and fatigue. Negative for chills and unexpected weight change.  HENT:  Negative for congestion, rhinorrhea, sneezing and sore throat.   Respiratory: Negative.  Negative for cough, chest tightness and shortness of breath.   Cardiovascular: Negative.  Negative for chest pain and palpitations.  Gastrointestinal: Negative.  Negative for abdominal pain, constipation, diarrhea, nausea and vomiting.  Genitourinary:  Negative for dysuria and frequency.  Musculoskeletal:  Positive for arthralgias and back pain. Negative for joint swelling and neck pain.  Psychiatric/Behavioral:  Positive for decreased concentration and sleep disturbance. Negative for behavioral problems (Depression), self-injury and suicidal ideas. The patient is not nervous/anxious.     Vital Signs: BP 112/60    Pulse 91   Temp 98.7 F (37.1 C)   Resp 16   Ht 5\' 4"  (1.626 m)   Wt 188 lb (85.3 kg)   SpO2 97%   BMI 32.27 kg/m    Physical Exam Vitals reviewed.  Constitutional:      General: She is not in acute distress.    Appearance: Normal appearance. She is obese. She is not ill-appearing.  HENT:     Head: Normocephalic and atraumatic.  Eyes:     Pupils: Pupils are equal, round, and reactive to light.  Cardiovascular:     Rate and Rhythm: Normal rate and regular rhythm.  Pulmonary:     Effort: Pulmonary effort is normal. No respiratory distress.  Neurological:     Mental Status: She is alert and oriented to person, place, and time.  Psychiatric:        Mood and Affect: Mood normal.        Behavior: Behavior normal.        Assessment/Plan: 1. Sleep disturbance (  Primary) Discussed trying OTC magnesium glycinate or doxylamine.   2. Chronic midline low back pain without sciatica No issues at this time.   3. Attention and concentration deficit Continue adderall as prescribed, follow up in 3 months for additional refills, UDS due at next office visit.  - amphetamine-dextroamphetamine (ADDERALL) 10 MG tablet; Take 1 tablet (10 mg total) by mouth 2 (two) times daily. As needed for symptoms of ADHD  Dispense: 60 tablet; Refill: 0 - amphetamine-dextroamphetamine (ADDERALL) 10 MG tablet; Take 1 tablet (10 mg total) by mouth 2 (two) times daily. As needed for symptoms of ADHD  Dispense: 60 tablet; Refill: 0 - amphetamine-dextroamphetamine (ADDERALL XR) 20 MG 24 hr capsule; Take 1 capsule (20 mg total) by mouth daily.  Dispense: 30 capsule; Refill: 0 - amphetamine-dextroamphetamine (ADDERALL XR) 20 MG 24 hr capsule; Take 1 capsule (20 mg total) by mouth daily.  Dispense: 30 capsule; Refill: 0 - amphetamine-dextroamphetamine (ADDERALL) 10 MG tablet; Take 1 tablet (10 mg total) by mouth 2 (two) times daily. As needed for symptoms of ADHD  Dispense: 60 tablet; Refill: 0 -  amphetamine-dextroamphetamine (ADDERALL XR) 20 MG 24 hr capsule; Take 1 capsule (20 mg total) by mouth daily.  Dispense: 30 capsule; Refill: 0   General Counseling: Trishna verbalizes understanding of the findings of todays visit and agrees with plan of treatment. I have discussed any further diagnostic evaluation that may be needed or ordered today. We also reviewed her medications today. she has been encouraged to call the office with any questions or concerns that should arise related to todays visit.    No orders of the defined types were placed in this encounter.   Meds ordered this encounter  Medications   amphetamine-dextroamphetamine (ADDERALL) 10 MG tablet    Sig: Take 1 tablet (10 mg total) by mouth 2 (two) times daily. As needed for symptoms of ADHD    Dispense:  60 tablet    Refill:  0    Fill for april   amphetamine-dextroamphetamine (ADDERALL) 10 MG tablet    Sig: Take 1 tablet (10 mg total) by mouth 2 (two) times daily. As needed for symptoms of ADHD    Dispense:  60 tablet    Refill:  0    Fill for march   amphetamine-dextroamphetamine (ADDERALL XR) 20 MG 24 hr capsule    Sig: Take 1 capsule (20 mg total) by mouth daily.    Dispense:  30 capsule    Refill:  0    Fill for april   amphetamine-dextroamphetamine (ADDERALL XR) 20 MG 24 hr capsule    Sig: Take 1 capsule (20 mg total) by mouth daily.    Dispense:  30 capsule    Refill:  0    Fill for march   amphetamine-dextroamphetamine (ADDERALL) 10 MG tablet    Sig: Take 1 tablet (10 mg total) by mouth 2 (two) times daily. As needed for symptoms of ADHD    Dispense:  60 tablet    Refill:  0    Fill for February   amphetamine-dextroamphetamine (ADDERALL XR) 20 MG 24 hr capsule    Sig: Take 1 capsule (20 mg total) by mouth daily.    Dispense:  30 capsule    Refill:  0    Fill for february    Return in about 3 months (around 09/25/2023) for F/U, ADHD med check, Jhane Lorio PCP, UDS due at next office visit .   Total  time spent:30 Minutes Time spent includes review of chart,  medications, test results, and follow up plan with the patient.   [place] Controlled Substance Database was reviewed by me.  This patient was seen by Sallyanne Kuster, FNP-C in collaboration with Dr. Beverely Risen as a part of collaborative care agreement.   Carlisia Geno R. Tedd Sias, MSN, FNP-C Internal medicine

## 2023-07-04 ENCOUNTER — Encounter: Payer: Self-pay | Admitting: Nurse Practitioner

## 2023-07-16 ENCOUNTER — Ambulatory Visit: Payer: 59 | Admitting: Dermatology

## 2023-09-13 ENCOUNTER — Encounter: Payer: Self-pay | Admitting: Nurse Practitioner

## 2023-09-13 MED ORDER — PREDNISONE 10 MG (21) PO TBPK: ORAL_TABLET | ORAL | 0 refills | Status: DC

## 2023-09-24 ENCOUNTER — Other Ambulatory Visit: Payer: Self-pay | Admitting: Nurse Practitioner

## 2023-09-24 DIAGNOSIS — Z1231 Encounter for screening mammogram for malignant neoplasm of breast: Secondary | ICD-10-CM

## 2023-10-01 ENCOUNTER — Ambulatory Visit: Payer: 59 | Admitting: Nurse Practitioner

## 2023-10-01 ENCOUNTER — Encounter: Payer: Self-pay | Admitting: Nurse Practitioner

## 2023-10-01 VITALS — BP 120/70 | HR 76 | Resp 16 | Ht 64.0 in | Wt 181.0 lb

## 2023-10-01 DIAGNOSIS — N951 Menopausal and female climacteric states: Secondary | ICD-10-CM | POA: Diagnosis not present

## 2023-10-01 DIAGNOSIS — E538 Deficiency of other specified B group vitamins: Secondary | ICD-10-CM | POA: Diagnosis not present

## 2023-10-01 DIAGNOSIS — G479 Sleep disorder, unspecified: Secondary | ICD-10-CM

## 2023-10-01 DIAGNOSIS — E559 Vitamin D deficiency, unspecified: Secondary | ICD-10-CM | POA: Diagnosis not present

## 2023-10-01 DIAGNOSIS — R41840 Attention and concentration deficit: Secondary | ICD-10-CM

## 2023-10-01 DIAGNOSIS — R5383 Other fatigue: Secondary | ICD-10-CM | POA: Diagnosis not present

## 2023-10-01 DIAGNOSIS — E782 Mixed hyperlipidemia: Secondary | ICD-10-CM

## 2023-10-01 DIAGNOSIS — Z79899 Other long term (current) drug therapy: Secondary | ICD-10-CM | POA: Diagnosis not present

## 2023-10-01 LAB — POCT URINE DRUG SCREEN
Methylenedioxyamphetamine: NOT DETECTED
POC Amphetamine UR: POSITIVE — AB
POC BENZODIAZEPINES UR: NOT DETECTED
POC Barbiturate UR: NOT DETECTED
POC Cocaine UR: NOT DETECTED
POC Ecstasy UR: NOT DETECTED
POC Marijuana UR: NOT DETECTED
POC Methadone UR: NOT DETECTED
POC Methamphetamine UR: NOT DETECTED
POC Opiate Ur: NOT DETECTED
POC Oxycodone UR: NOT DETECTED
POC PHENCYCLIDINE UR: NOT DETECTED
POC TRICYCLICS UR: NOT DETECTED

## 2023-10-01 MED ORDER — LISDEXAMFETAMINE DIMESYLATE 50 MG PO CAPS: 50.0000 mg | ORAL_CAPSULE | Freq: Every day | ORAL | 0 refills | Status: DC

## 2023-10-01 MED ORDER — AMPHETAMINE-DEXTROAMPHETAMINE 5 MG PO TABS: 5.0000 mg | ORAL_TABLET | Freq: Every day | ORAL | 0 refills | Status: DC | PRN

## 2023-10-01 NOTE — Progress Notes (Signed)
 [facility] [address] [place], [place] [address]  Internal MEDICINE  Office Visit Note  Patient Name: Jamie Meadows  [id number]  [id number]  Date of Service: 10/01/2023  Chief Complaint  Patient presents with   Follow-up    medication   ADHD    HPI Ryla presents for a follow-up visit for fatigue, ADHD, perimenopause and refills and labs. Persistent fatigue -- wants to have labs done ADHD -- has been on adderall but her sister was switched to vyvanse  and now she wants to try vyvanse . BP and heart rate are normal. Denies any palpitations or other adverse effects of the medication  Perimenopause -- having symptoms including fatigue, insomnia, mood swings, hot flashes, night sweats and brain fog. Wants to have her hormone levels checked.     Current Medication: Outpatient Encounter Medications as of 10/01/2023  Medication Sig   amphetamine -dextroamphetamine  (ADDERALL) 5 MG tablet Take 1 tablet (5 mg total) by mouth daily as needed (ADHD symptoms). Between noon and 2 pm.   cetirizine  (ZYRTEC ) 10 MG tablet Take 1 tablet (10 mg total) by mouth daily.   fluticasone  (FLONASE ) 50 MCG/ACT nasal spray Place into both nostrils daily.   levonorgestrel  (MIRENA ) 20 MCG/24HR IUD 1 each by Intrauterine route once.   lisdexamfetamine (VYVANSE ) 50 MG capsule Take 1 capsule (50 mg total) by mouth daily.   methocarbamol  (ROBAXIN ) 500 MG tablet Take 1 tablet (500 mg total) by mouth 2 (two) times daily as needed for muscle spasms.   Multiple Vitamin (MULTIVITAMIN) tablet Take 1 tablet by mouth daily.   [DISCONTINUED] amphetamine -dextroamphetamine  (ADDERALL XR) 20 MG 24 hr capsule Take 1 capsule (20 mg total) by mouth daily.   [DISCONTINUED] amphetamine -dextroamphetamine  (ADDERALL XR) 20 MG 24 hr capsule Take 1 capsule (20 mg total) by mouth daily.   [DISCONTINUED] amphetamine -dextroamphetamine  (ADDERALL XR) 20 MG 24 hr capsule Take 1 capsule (20 mg total) by mouth daily.    [DISCONTINUED] amphetamine -dextroamphetamine  (ADDERALL) 10 MG tablet Take 1 tablet (10 mg total) by mouth 2 (two) times daily. As needed for symptoms of ADHD   [DISCONTINUED] amphetamine -dextroamphetamine  (ADDERALL) 10 MG tablet Take 1 tablet (10 mg total) by mouth 2 (two) times daily. As needed for symptoms of ADHD   [DISCONTINUED] amphetamine -dextroamphetamine  (ADDERALL) 10 MG tablet Take 1 tablet (10 mg total) by mouth 2 (two) times daily. As needed for symptoms of ADHD   [DISCONTINUED] predniSONE  (STERAPRED UNI-PAK 21 TAB) 10 MG (21) TBPK tablet Use as directed for 6 days   No facility-administered encounter medications on file as of 10/01/2023.    Surgical History: Past Surgical History:  Procedure Laterality Date   ABDOMINOPLASTY  2017   COLPOSCOPY  09/21/2015   WISDOM TOOTH EXTRACTION      Medical History: Past Medical History:  Diagnosis Date   ADHD    Allergic rhinitis    Fracture of clavicle with routine healing right   History of abnormal cervical Pap smear 2017   LGSIL with HRHPV   Sinusitis    Wrist fracture, closed    left    Family History: Family History  Problem Relation Age of Onset   Diabetes Mother    Kidney disease Mother    Allergies Father    Breast cancer Maternal Aunt 50   Diabetes Maternal Grandfather    Hypertension Maternal Grandfather    Cancer Sister     Social History   Socioeconomic History   Marital status: Divorced    Spouse name: Not on file  Number of children: 4   Years of education: 16   Highest education level: Not on file  Occupational History   Occupation: self emplyeed-[employer]  Tobacco Use   Smoking status: Former   Smokeless tobacco: Never  Vaping Use   Vaping status: Never Used  Substance and Sexual Activity   Alcohol use: Yes    Alcohol/week: 7.0 standard drinks of alcohol    Types: 7 Glasses of wine per week    Comment: occasionally   Drug use: Never   Sexual activity: Yes    Birth control/protection: I.U.D.   Other Topics Concern   Not on file  Social History Narrative   Not on file   Social Drivers of Health   Financial Resource Strain: Not on file  Food Insecurity: Not on file  Transportation Needs: Not on file  Physical Activity: Insufficiently Active (09/04/2017)   Exercise Vital Sign    Days of Exercise per Week: 4 days    Minutes of Exercise per Session: 30 min  Stress: No Stress Concern Present (09/04/2017)   [employer] of Occupational Health - Occupational Stress Questionnaire    Feeling of Stress : Not at all  Social Connections: Not on file  Intimate Partner Violence: Not on file      Review of Systems  Constitutional:  Positive for activity change (low energy), fatigue and unexpected weight change. Negative for chills.  HENT:  Negative for congestion, rhinorrhea, sneezing and sore throat.   Respiratory: Negative.  Negative for cough, chest tightness, shortness of breath and wheezing.   Cardiovascular: Negative.  Negative for chest pain and palpitations.  Gastrointestinal: Negative.  Negative for abdominal pain, constipation, diarrhea, nausea and vomiting.  Endocrine: Positive for heat intolerance.  Genitourinary:  Negative for dysuria and frequency.  Musculoskeletal:  Positive for arthralgias and back pain. Negative for joint swelling and neck pain.  Neurological:  Positive for headaches.  Psychiatric/Behavioral:  Positive for decreased concentration, dysphoric mood and sleep disturbance. Negative for behavioral problems (Depression), self-injury and suicidal ideas. The patient is nervous/anxious.     Vital Signs: BP 120/70   Pulse 76   Resp 16   Ht 5\' 4"  (1.626 m)   Wt 181 lb (82.1 kg)   SpO2 97%   BMI 31.07 kg/m    Physical Exam Vitals reviewed.  Constitutional:      General: She is not in acute distress.    Appearance: Normal appearance. She is obese. She is not ill-appearing.  HENT:     Head: Normocephalic and atraumatic.  Eyes:     Pupils:  Pupils are equal, round, and reactive to light.  Cardiovascular:     Rate and Rhythm: Normal rate and regular rhythm.  Pulmonary:     Effort: Pulmonary effort is normal. No respiratory distress.  Neurological:     Mental Status: She is alert and oriented to person, place, and time.  Psychiatric:        Mood and Affect: Mood normal.        Behavior: Behavior normal.        Assessment/Plan: 1. Other fatigue (Primary) Routine labs ordered  - CBC with Differential/Platelet - CMP14+EGFR - Lipid Profile - Vitamin D  (25 hydroxy) - B12 and Folate Panel - Iron, TIBC and Ferritin Panel - Estradiol  - FSH/LH  2. Mixed hyperlipidemia Routine labs ordered  - CBC with Differential/Platelet - CMP14+EGFR - Lipid Profile  3. B12 deficiency Routine labs ordered  - CBC with Differential/Platelet - B12 and Folate Panel -  Iron, TIBC and Ferritin Panel  4. Vitamin D  deficiency Routine lab ordered  - Vitamin D  (25 hydroxy)  5. Sleep disturbance Routine labs ordered - CBC with Differential/Platelet - CMP14+EGFR - Lipid Profile - Vitamin D  (25 hydroxy) - B12 and Folate Panel - Iron, TIBC and Ferritin Panel - Estradiol  - FSH/LH  6. Vasomotor symptoms due to menopause Routine labs ordered  - Lipid Profile - Estradiol  - FSH/LH  7. Attention and concentration deficit Will try vyvanse  as prescribed.   8. Encounter for long-term (current) use of medications UDS positive for amphetamines which is consistent with current prescriptions.  - POCT Urine Drug Screen   General Counseling: [place] verbalizes understanding of the findings of todays visit and agrees with plan of treatment. I have discussed any further diagnostic evaluation that may be needed or ordered today. We also reviewed her medications today. she has been encouraged to call the office with any questions or concerns that should arise related to todays visit.    Orders Placed This Encounter  Procedures   CBC with  Differential/Platelet   CMP14+EGFR   Lipid Profile   Vitamin D  (25 hydroxy)   B12 and Folate Panel   Iron, TIBC and Ferritin Panel   Estradiol    FSH/LH   POCT Urine Drug Screen    Meds ordered this encounter  Medications   lisdexamfetamine (VYVANSE ) 50 MG capsule    Sig: Take 1 capsule (50 mg total) by mouth daily.    Dispense:  30 capsule    Refill:  0    fill new script today.   amphetamine -dextroamphetamine  (ADDERALL) 5 MG tablet    Sig: Take 1 tablet (5 mg total) by mouth daily as needed (ADHD symptoms). Between noon and 2 pm.    Dispense:  30 tablet    Refill:  0    Fill new script today    Return in about 4 weeks (around 10/29/2023) for F/U, Labs, eval new med, Alaiza Yau PCP vyvanse .   Total time spent:30 Minutes Time spent includes review of chart, medications, test results, and follow up plan with the patient.    Controlled Substance Database was reviewed by me.  This patient was seen by Laurence Pons, FNP-C in collaboration with Dr. Verneta Gone as a part of collaborative care agreement.   Layah Skousen R. Bobbi Burow, MSN, FNP-C Internal medicine

## 2023-10-04 ENCOUNTER — Encounter: Payer: Self-pay | Admitting: Nurse Practitioner

## 2023-10-04 DIAGNOSIS — N951 Menopausal and female climacteric states: Secondary | ICD-10-CM | POA: Diagnosis not present

## 2023-10-04 DIAGNOSIS — E559 Vitamin D deficiency, unspecified: Secondary | ICD-10-CM | POA: Diagnosis not present

## 2023-10-04 DIAGNOSIS — R41840 Attention and concentration deficit: Secondary | ICD-10-CM | POA: Diagnosis not present

## 2023-10-04 DIAGNOSIS — R5383 Other fatigue: Secondary | ICD-10-CM | POA: Diagnosis not present

## 2023-10-04 DIAGNOSIS — E538 Deficiency of other specified B group vitamins: Secondary | ICD-10-CM | POA: Diagnosis not present

## 2023-10-04 DIAGNOSIS — E782 Mixed hyperlipidemia: Secondary | ICD-10-CM | POA: Diagnosis not present

## 2023-10-04 DIAGNOSIS — Z79899 Other long term (current) drug therapy: Secondary | ICD-10-CM | POA: Diagnosis not present

## 2023-10-04 DIAGNOSIS — G479 Sleep disorder, unspecified: Secondary | ICD-10-CM | POA: Diagnosis not present

## 2023-10-05 LAB — LIPID PANEL
Chol/HDL Ratio: 2.8 ratio (ref 0.0–4.4)
Cholesterol, Total: 156 mg/dL (ref 100–199)
HDL: 56 mg/dL (ref >39)
LDL Chol Calc (NIH): 85 mg/dL (ref 0–99)
Triglycerides: 76 mg/dL (ref 0–149)
VLDL Cholesterol Cal: 15 mg/dL (ref 5–40)

## 2023-10-05 LAB — CMP14+EGFR
ALT: 24 IU/L (ref 0–32)
AST: 20 IU/L (ref 0–40)
Albumin: 4.4 g/dL (ref 3.9–4.9)
Alkaline Phosphatase: 58 IU/L (ref 44–121)
BUN/Creatinine Ratio: 18 (ref 9–23)
BUN: 14 mg/dL (ref 6–24)
Bilirubin Total: 0.7 mg/dL (ref 0.0–1.2)
CO2: 22 mmol/L (ref 20–29)
Calcium: 9.3 mg/dL (ref 8.7–10.2)
Chloride: 102 mmol/L (ref 96–106)
Creatinine, Ser: 0.79 mg/dL (ref 0.57–1.00)
Globulin, Total: 2.7 g/dL (ref 1.5–4.5)
Glucose: 92 mg/dL (ref 70–99)
Potassium: 3.8 mmol/L (ref 3.5–5.2)
Sodium: 142 mmol/L (ref 134–144)
Total Protein: 7.1 g/dL (ref 6.0–8.5)
eGFR: 93 mL/min/{1.73_m2} (ref >59)

## 2023-10-05 LAB — ESTRADIOL: Estradiol: 64.7 pg/mL

## 2023-10-05 LAB — CBC WITH DIFFERENTIAL/PLATELET
Basophils Absolute: 0.1 10*3/uL (ref 0.0–0.2)
Basos: 1 %
EOS (ABSOLUTE): 0.1 10*3/uL (ref 0.0–0.4)
Eos: 1 %
Hematocrit: 42.5 % (ref 34.0–46.6)
Hemoglobin: 14 g/dL (ref 11.1–15.9)
Immature Grans (Abs): 0 10*3/uL (ref 0.0–0.1)
Immature Granulocytes: 0 %
Lymphocytes Absolute: 1.8 10*3/uL (ref 0.7–3.1)
Lymphs: 28 %
MCH: 32.2 pg (ref 26.6–33.0)
MCHC: 32.9 g/dL (ref 31.5–35.7)
MCV: 98 fL — ABNORMAL HIGH (ref 79–97)
Monocytes Absolute: 0.6 10*3/uL (ref 0.1–0.9)
Monocytes: 10 %
Neutrophils Absolute: 3.8 10*3/uL (ref 1.4–7.0)
Neutrophils: 60 %
Platelets: 280 10*3/uL (ref 150–450)
RBC: 4.35 x10E6/uL (ref 3.77–5.28)
RDW: 12.3 % (ref 11.7–15.4)
WBC: 6.3 10*3/uL (ref 3.4–10.8)

## 2023-10-05 LAB — IRON,TIBC AND FERRITIN PANEL
Ferritin: 112 ng/mL (ref 15–150)
Iron Saturation: 39 % (ref 15–55)
Iron: 130 ug/dL (ref 27–159)
Total Iron Binding Capacity: 334 ug/dL (ref 250–450)
UIBC: 204 ug/dL (ref 131–425)

## 2023-10-05 LAB — VITAMIN D 25 HYDROXY (VIT D DEFICIENCY, FRACTURES): Vit D, 25-Hydroxy: 35.8 ng/mL (ref 30.0–100.0)

## 2023-10-05 LAB — FSH/LH
FSH: 2.2 m[IU]/mL
LH: 3.4 m[IU]/mL

## 2023-10-05 LAB — B12 AND FOLATE PANEL
Folate: 6.1 ng/mL (ref >3.0)
Vitamin B-12: 640 pg/mL (ref 232–1245)

## 2023-10-06 MED ORDER — LISDEXAMFETAMINE DIMESYLATE 50 MG PO CAPS: 50.0000 mg | ORAL_CAPSULE | Freq: Every day | ORAL | 0 refills | Status: DC

## 2023-10-08 ENCOUNTER — Ambulatory Visit
Admission: RE | Admit: 2023-10-08 | Discharge: 2023-10-08 | Disposition: A | Discharge status: Home / Self Care | Source: Ambulatory Visit | Attending: Nurse Practitioner | Admitting: Nurse Practitioner

## 2023-10-08 DIAGNOSIS — Z1231 Encounter for screening mammogram for malignant neoplasm of breast: Secondary | ICD-10-CM | POA: Insufficient documentation

## 2023-10-15 ENCOUNTER — Encounter: Payer: Self-pay | Admitting: Nurse Practitioner

## 2023-10-15 DIAGNOSIS — R41840 Attention and concentration deficit: Secondary | ICD-10-CM

## 2023-10-16 MED ORDER — AMPHETAMINE-DEXTROAMPHET ER 20 MG PO CP24: 20.0000 mg | ORAL_CAPSULE | Freq: Every day | ORAL | 0 refills | Status: DC

## 2023-10-16 MED ORDER — AMPHETAMINE-DEXTROAMPHETAMINE 10 MG PO TABS: 10.0000 mg | ORAL_TABLET | Freq: Two times a day (BID) | ORAL | 0 refills | Status: DC

## 2023-10-16 MED ORDER — AMPHETAMINE-DEXTROAMPHET ER 20 MG PO CP24
20.0000 mg | ORAL_CAPSULE | Freq: Every day | ORAL | 0 refills | Status: DC
Start: 2023-10-16 — End: 2024-01-14

## 2023-10-17 DIAGNOSIS — Z1331 Encounter for screening for depression: Secondary | ICD-10-CM | POA: Diagnosis not present

## 2023-10-17 DIAGNOSIS — R5383 Other fatigue: Secondary | ICD-10-CM | POA: Diagnosis not present

## 2023-10-17 DIAGNOSIS — Z124 Encounter for screening for malignant neoplasm of cervix: Secondary | ICD-10-CM | POA: Diagnosis not present

## 2023-10-17 DIAGNOSIS — Z01419 Encounter for gynecological examination (general) (routine) without abnormal findings: Secondary | ICD-10-CM | POA: Diagnosis not present

## 2023-10-27 ENCOUNTER — Encounter: Payer: Self-pay | Admitting: Nurse Practitioner

## 2023-10-29 ENCOUNTER — Telehealth: Payer: Self-pay | Admitting: Nurse Practitioner

## 2023-10-29 ENCOUNTER — Ambulatory Visit: Payer: Self-pay | Admitting: Nurse Practitioner

## 2023-10-29 ENCOUNTER — Ambulatory Visit: Admitting: Nurse Practitioner

## 2023-10-29 NOTE — Telephone Encounter (Signed)
 Lvm & sent message to r/s cancelled appointment to review labs and to schedule appointment in August for medication refill-Toni

## 2023-11-05 ENCOUNTER — Ambulatory Visit: Admitting: Nurse Practitioner

## 2023-11-14 ENCOUNTER — Encounter: Payer: Self-pay | Admitting: Nurse Practitioner

## 2024-01-14 ENCOUNTER — Encounter: Payer: Self-pay | Admitting: Nurse Practitioner

## 2024-01-14 DIAGNOSIS — R41840 Attention and concentration deficit: Secondary | ICD-10-CM

## 2024-01-14 MED ORDER — AMPHETAMINE-DEXTROAMPHET ER 20 MG PO CP24: 20.0000 mg | ORAL_CAPSULE | Freq: Every day | ORAL | 0 refills | Status: DC

## 2024-01-14 MED ORDER — AMPHETAMINE-DEXTROAMPHETAMINE 10 MG PO TABS: 10.0000 mg | ORAL_TABLET | Freq: Two times a day (BID) | ORAL | 0 refills | Status: DC

## 2024-01-23 ENCOUNTER — Encounter: Payer: Self-pay | Admitting: Nurse Practitioner

## 2024-01-23 ENCOUNTER — Ambulatory Visit (INDEPENDENT_AMBULATORY_CARE_PROVIDER_SITE_OTHER): Admitting: Nurse Practitioner

## 2024-01-23 VITALS — BP 110/80 | HR 100 | Temp 97.8°F | Resp 16 | Ht 64.0 in | Wt 167.0 lb

## 2024-01-23 DIAGNOSIS — J301 Allergic rhinitis due to pollen: Secondary | ICD-10-CM | POA: Diagnosis not present

## 2024-01-23 DIAGNOSIS — Z0001 Encounter for general adult medical examination with abnormal findings: Secondary | ICD-10-CM | POA: Diagnosis not present

## 2024-01-23 DIAGNOSIS — M5441 Lumbago with sciatica, right side: Secondary | ICD-10-CM

## 2024-01-23 DIAGNOSIS — R41840 Attention and concentration deficit: Secondary | ICD-10-CM | POA: Diagnosis not present

## 2024-01-23 DIAGNOSIS — R5383 Other fatigue: Secondary | ICD-10-CM

## 2024-01-23 DIAGNOSIS — Z111 Encounter for screening for respiratory tuberculosis: Secondary | ICD-10-CM

## 2024-01-23 DIAGNOSIS — G8929 Other chronic pain: Secondary | ICD-10-CM

## 2024-01-23 MED ORDER — AMPHETAMINE-DEXTROAMPHET ER 20 MG PO CP24: 20.0000 mg | ORAL_CAPSULE | Freq: Every day | ORAL | 0 refills | Status: DC

## 2024-01-23 MED ORDER — AMPHETAMINE-DEXTROAMPHETAMINE 10 MG PO TABS: 10.0000 mg | ORAL_TABLET | Freq: Two times a day (BID) | ORAL | 0 refills | Status: DC

## 2024-01-23 MED ORDER — METHOCARBAMOL 500 MG PO TABS: 500.0000 mg | ORAL_TABLET | Freq: Two times a day (BID) | ORAL | 3 refills | Status: AC | PRN

## 2024-01-23 NOTE — Progress Notes (Signed)
 [facility] [address] [place], [place] [address]  Internal MEDICINE  Office Visit Note  Patient Name: Jamie Meadows  [id number]  [id number]  Date of Service: 01/23/2024  Chief Complaint  Patient presents with   Annual Exam    HPI Jamie Meadows presents for an annual well visit and physical exam.  Well-appearing 48 y.o. female with ADHD and seasonal allergies  Routine CRC screening: cologuard done in September 2024, negative. Due in 2027 Routine mammogram: done in May this year  DEXA scan: not due yet Pap smear: pap done in 2021, sees OBGYN Labs: up to date, full set of labs done in may this year.  New or worsening pain: right sided sciatica with low back pain, comes and goes about twice a day and lasts for a while. Reports that stretching helps.  Other concerns: none Has a new job, needs tuberculosis screening, will order TB gold.  ADHD -- her heart rate and BP are normal. She denies any palpitations or other adverse side effects of the medication. She is due for refills. The current dose remains effective.    Current Medication: Outpatient Encounter Medications as of 01/23/2024  Medication Sig   cetirizine  (ZYRTEC ) 10 MG tablet Take 1 tablet (10 mg total) by mouth daily.   fluticasone  (FLONASE ) 50 MCG/ACT nasal spray Place into both nostrils daily.   levonorgestrel  (MIRENA ) 20 MCG/24HR IUD 1 each by Intrauterine route once.   Multiple Vitamin (MULTIVITAMIN) tablet Take 1 tablet by mouth daily.   Tdap (BOOSTRIX) 5-2.5-18.5 LF-MCG/0.5 injection Inject 0.5 mLs into the muscle once.   [DISCONTINUED] amphetamine -dextroamphetamine  (ADDERALL XR) 20 MG 24 hr capsule Take 1 capsule (20 mg total) by mouth daily.   [DISCONTINUED] amphetamine -dextroamphetamine  (ADDERALL XR) 20 MG 24 hr capsule Take 1 capsule (20 mg total) by mouth daily.   [DISCONTINUED] amphetamine -dextroamphetamine  (ADDERALL XR) 20 MG 24 hr capsule Take 1 capsule (20 mg total) by mouth daily.    [DISCONTINUED] amphetamine -dextroamphetamine  (ADDERALL) 10 MG tablet Take 1 tablet (10 mg total) by mouth 2 (two) times daily. As needed for symptoms of ADHD   [DISCONTINUED] amphetamine -dextroamphetamine  (ADDERALL) 10 MG tablet Take 1 tablet (10 mg total) by mouth 2 (two) times daily. As needed for symptoms of ADHD   [DISCONTINUED] amphetamine -dextroamphetamine  (ADDERALL) 10 MG tablet Take 1 tablet (10 mg total) by mouth 2 (two) times daily. As needed for symptoms of ADHD   [DISCONTINUED] methocarbamol  (ROBAXIN ) 500 MG tablet Take 1 tablet (500 mg total) by mouth 2 (two) times daily as needed for muscle spasms.   [START ON 03/19/2024] amphetamine -dextroamphetamine  (ADDERALL XR) 20 MG 24 hr capsule Take 1 capsule (20 mg total) by mouth daily.   [START ON 02/20/2024] amphetamine -dextroamphetamine  (ADDERALL XR) 20 MG 24 hr capsule Take 1 capsule (20 mg total) by mouth daily.   amphetamine -dextroamphetamine  (ADDERALL XR) 20 MG 24 hr capsule Take 1 capsule (20 mg total) by mouth daily.   [START ON 03/19/2024] amphetamine -dextroamphetamine  (ADDERALL) 10 MG tablet Take 1 tablet (10 mg total) by mouth 2 (two) times daily. As needed for symptoms of ADHD   [START ON 02/20/2024] amphetamine -dextroamphetamine  (ADDERALL) 10 MG tablet Take 1 tablet (10 mg total) by mouth 2 (two) times daily. As needed for symptoms of ADHD   amphetamine -dextroamphetamine  (ADDERALL) 10 MG tablet Take 1 tablet (10 mg total) by mouth 2 (two) times daily. As needed for symptoms of ADHD   methocarbamol  (ROBAXIN ) 500 MG tablet Take 1 tablet (500 mg total) by mouth 2 (two) times daily as needed  for muscle spasms.   No facility-administered encounter medications on file as of 01/23/2024.    Surgical History: Past Surgical History:  Procedure Laterality Date   ABDOMINOPLASTY  2017   COLPOSCOPY  09/21/2015   WISDOM TOOTH EXTRACTION      Medical History: Past Medical History:  Diagnosis Date   ADHD    Allergic rhinitis    Fracture of  clavicle with routine healing right   History of abnormal cervical Pap smear 2017   LGSIL with HRHPV   Sinusitis    Wrist fracture, closed    left    Family History: Family History  Problem Relation Age of Onset   Diabetes Mother    Kidney disease Mother    Allergies Father    Breast cancer Maternal Aunt 50   Diabetes Maternal Grandfather    Hypertension Maternal Grandfather    Cancer Sister     Social History   Socioeconomic History   Marital status: Divorced    Spouse name: Not on file   Number of children: 4   Years of education: 16   Highest education level: Not on file  Occupational History   Occupation: self emplyeed-[employer]  Tobacco Use   Smoking status: Former   Smokeless tobacco: Never  Vaping Use   Vaping status: Never Used  Substance and Sexual Activity   Alcohol use: Yes    Alcohol/week: 7.0 standard drinks of alcohol    Types: 7 Glasses of wine per week    Comment: occasionally   Drug use: Never   Sexual activity: Yes    Birth control/protection: I.U.D.  Other Topics Concern   Not on file  Social History Narrative   Not on file   Social Drivers of Health   Financial Resource Strain: Low Risk  (10/17/2023)   Received from [facility]   Overall Financial Resource Strain (CARDIA)    Difficulty of Paying Living Expenses: Not hard at all  Food Insecurity: No Food Insecurity (10/17/2023)   Received from [facility]   Hunger Vital Sign    Within the past 12 months, you worried that your food would run out before you got the money to buy more.: Never true    Within the past 12 months, the food you bought just didn't last and you didn't have money to get more.: Never true  Transportation Needs: No Transportation Needs (10/17/2023)   Received from [facility] - Transportation    In the past 12 months, has lack of transportation kept you from medical appointments or from getting medications?:  No    Lack of Transportation (Non-Medical): No  Physical Activity: Insufficiently Active (09/04/2017)   Exercise Vital Sign    Days of Exercise per Week: 4 days    Minutes of Exercise per Session: 30 min  Stress: No Stress Concern Present (09/04/2017)   [employer] of Occupational Health - Occupational Stress Questionnaire    Feeling of Stress : Not at all  Social Connections: Not on file  Intimate Partner Violence: Not on file      Review of Systems  Constitutional:  Negative for activity change, appetite change, chills, fatigue, fever and unexpected weight change.  HENT: Negative.  Negative for congestion, ear pain, rhinorrhea, sore throat and trouble swallowing.   Eyes: Negative.   Respiratory: Negative.  Negative for cough, chest tightness, shortness of breath and wheezing.   Cardiovascular: Negative.  Negative for chest pain.  Gastrointestinal: Negative.  Negative for abdominal pain, blood in stool, constipation, diarrhea, nausea and vomiting.  Endocrine: Negative.   Genitourinary: Negative.  Negative for difficulty urinating, dysuria, frequency, hematuria and urgency.  Musculoskeletal: Negative.  Negative for arthralgias, back pain, joint swelling, myalgias and neck pain.  Skin: Negative.  Negative for rash and wound.  Allergic/Immunologic: Negative.  Negative for immunocompromised state.  Neurological: Negative.  Negative for dizziness, seizures, numbness and headaches.  Hematological: Negative.   Psychiatric/Behavioral: Negative.  Negative for behavioral problems, self-injury and suicidal ideas. The patient is not nervous/anxious.     Vital Signs: BP 110/80   Pulse 100   Temp 97.8 F (36.6 C)   Resp 16   Ht 5' 4 (1.626 m)   Wt 167 lb (75.8 kg)   SpO2 99%   BMI 28.67 kg/m    Physical Exam Vitals reviewed.  Constitutional:      General: She is awake. She is not in acute distress.    Appearance: Normal appearance. She is well-developed and well-groomed. She  is obese. She is not ill-appearing or diaphoretic.  HENT:     Head: Normocephalic and atraumatic.     Right Ear: Tympanic membrane, ear canal and external ear normal.     Left Ear: Tympanic membrane, ear canal and external ear normal.     Nose: Nose normal. No congestion or rhinorrhea.     Mouth/Throat:     Lips: Pink.     Mouth: Mucous membranes are moist.     Pharynx: Oropharynx is clear. Uvula midline. No oropharyngeal exudate.  Eyes:     General: Lids are normal. Vision grossly intact. Gaze aligned appropriately. No scleral icterus.       Right eye: No discharge.        Left eye: No discharge.     Conjunctiva/sclera: Conjunctivae normal.     Pupils: Pupils are equal, round, and reactive to light.     Funduscopic exam:    Right eye: Red reflex present.        Left eye: Red reflex present. Neck:     Thyroid : No thyromegaly.     Vascular: No JVD.     Trachea: Trachea and phonation normal. No tracheal deviation.  Cardiovascular:     Rate and Rhythm: Normal rate and regular rhythm.     Pulses: Normal pulses.     Heart sounds: Normal heart sounds, S1 normal and S2 normal. No murmur heard.    No friction rub. No gallop.  Pulmonary:     Effort: Pulmonary effort is normal. No accessory muscle usage or respiratory distress.     Breath sounds: Normal breath sounds and air entry. No stridor. No wheezing or rales.  Chest:     Chest wall: No tenderness.     Comments: Declined clinical breast exam Abdominal:     General: Bowel sounds are normal. There is no distension.     Palpations: Abdomen is soft. There is no shifting dullness, fluid wave, mass or pulsatile mass.     Tenderness: There is no abdominal tenderness. There is no guarding or rebound.  Musculoskeletal:        General: No tenderness or deformity. Normal range of motion.     Cervical back: Normal range of motion and neck supple.     Right lower leg: No edema.     Left lower leg: No edema.  Lymphadenopathy:     Cervical:  No cervical adenopathy.  Skin:    General: Skin is warm and dry.  Coloration: Skin is not pale.     Findings: No erythema or rash.  Neurological:     Mental Status: She is alert.     Cranial Nerves: No cranial nerve deficit.     Motor: No abnormal muscle tone.     Coordination: Coordination normal.     Deep Tendon Reflexes: Reflexes are normal and symmetric.  Psychiatric:        Mood and Affect: Mood and affect normal.        Behavior: Behavior normal. Behavior is cooperative.        Thought Content: Thought content normal.        Judgment: Judgment normal.        Assessment/Plan: 1. Encounter for routine adult health examination with abnormal findings (Primary) Age-appropriate preventive screenings and vaccinations discussed, annual physical exam completed. Routine labs for health maintenance done recently and results were previously discussed. PHM updated.    2. Chronic midline low back pain with right-sided sciatica Take methocarbamol  as needed  - methocarbamol  (ROBAXIN ) 500 MG tablet; Take 1 tablet (500 mg total) by mouth 2 (two) times daily as needed for muscle spasms.  Dispense: 60 tablet; Refill: 3  3. Other fatigue Persistent and most likely due to demanding home lifestyle and new job.   4. Seasonal allergic rhinitis due to pollen Continue OTC cetirizine  and flonase  as prescribed.   5. Attention and concentration deficit Continue adderall as prescribed. Follow up in 3 months for additional refills.  - amphetamine -dextroamphetamine  (ADDERALL XR) 20 MG 24 hr capsule; Take 1 capsule (20 mg total) by mouth daily.  Dispense: 30 capsule; Refill: 0 - amphetamine -dextroamphetamine  (ADDERALL) 10 MG tablet; Take 1 tablet (10 mg total) by mouth 2 (two) times daily. As needed for symptoms of ADHD  Dispense: 60 tablet; Refill: 0 - amphetamine -dextroamphetamine  (ADDERALL XR) 20 MG 24 hr capsule; Take 1 capsule (20 mg total) by mouth daily.  Dispense: 30 capsule; Refill: 0 -  amphetamine -dextroamphetamine  (ADDERALL) 10 MG tablet; Take 1 tablet (10 mg total) by mouth 2 (two) times daily. As needed for symptoms of ADHD  Dispense: 60 tablet; Refill: 0 - amphetamine -dextroamphetamine  (ADDERALL XR) 20 MG 24 hr capsule; Take 1 capsule (20 mg total) by mouth daily.  Dispense: 30 capsule; Refill: 0 - amphetamine -dextroamphetamine  (ADDERALL) 10 MG tablet; Take 1 tablet (10 mg total) by mouth 2 (two) times daily. As needed for symptoms of ADHD  Dispense: 60 tablet; Refill: 0  6. Encounter for screening for respiratory tuberculosis TB lab test ordered  - QuantiFERON-TB Gold Plus     General Counseling: Jamie Meadows verbalizes understanding of the findings of todays visit and agrees with plan of treatment. I have discussed any further diagnostic evaluation that may be needed or ordered today. We also reviewed her medications today. she has been encouraged to call the office with any questions or concerns that should arise related to todays visit.    Orders Placed This Encounter  Procedures   QuantiFERON-TB Gold Plus    Meds ordered this encounter  Medications   amphetamine -dextroamphetamine  (ADDERALL XR) 20 MG 24 hr capsule    Sig: Take 1 capsule (20 mg total) by mouth daily.    Dispense:  30 capsule    Refill:  0    Fill for October 30.   amphetamine -dextroamphetamine  (ADDERALL) 10 MG tablet    Sig: Take 1 tablet (10 mg total) by mouth 2 (two) times daily. As needed for symptoms of ADHD    Dispense:  60 tablet  Refill:  0    Fill for October 30   amphetamine -dextroamphetamine  (ADDERALL XR) 20 MG 24 hr capsule    Sig: Take 1 capsule (20 mg total) by mouth daily.    Dispense:  30 capsule    Refill:  0    Fill for October 2   amphetamine -dextroamphetamine  (ADDERALL) 10 MG tablet    Sig: Take 1 tablet (10 mg total) by mouth 2 (two) times daily. As needed for symptoms of ADHD    Dispense:  60 tablet    Refill:  0    Fill for October 2    amphetamine -dextroamphetamine  (ADDERALL XR) 20 MG 24 hr capsule    Sig: Take 1 capsule (20 mg total) by mouth daily.    Dispense:  30 capsule    Refill:  0    Fill for september   amphetamine -dextroamphetamine  (ADDERALL) 10 MG tablet    Sig: Take 1 tablet (10 mg total) by mouth 2 (two) times daily. As needed for symptoms of ADHD    Dispense:  60 tablet    Refill:  0    Fill for september   methocarbamol  (ROBAXIN ) 500 MG tablet    Sig: Take 1 tablet (500 mg total) by mouth 2 (two) times daily as needed for muscle spasms.    Dispense:  60 tablet    Refill:  3    Please fill new script today.    Return in about 3 months (around 04/13/2024) for F/U, ADHD med check, Maylie Ashton PCP.   Total time spent:30 Minutes Time spent includes review of chart, medications, test results, and follow up plan with the patient.   [place] Controlled Substance Database was reviewed by me.  This patient was seen by Mardy Maxin, FNP-C in collaboration with Dr. Sigrid Bathe as a part of collaborative care agreement.  Neilani Duffee R. Maxin, MSN, FNP-C Internal medicine

## 2024-01-27 ENCOUNTER — Encounter: Payer: Self-pay | Admitting: Nurse Practitioner

## 2024-02-03 LAB — QUANTIFERON-TB GOLD PLUS
QuantiFERON Mitogen Value: >10 [IU]/mL
QuantiFERON Nil Value: 0.07 [IU]/mL
QuantiFERON TB1 Ag Value: 0.06 [IU]/mL
QuantiFERON TB2 Ag Value: 0.07 [IU]/mL
QuantiFERON-TB Gold Plus: NEGATIVE

## 2024-04-24 ENCOUNTER — Encounter: Payer: Self-pay | Admitting: Nurse Practitioner

## 2024-04-24 ENCOUNTER — Ambulatory Visit: Admitting: Nurse Practitioner

## 2024-04-24 VITALS — BP 110/80 | HR 87 | Temp 97.8°F | Resp 16 | Ht 64.0 in | Wt 168.0 lb

## 2024-04-24 DIAGNOSIS — J309 Allergic rhinitis, unspecified: Secondary | ICD-10-CM

## 2024-04-24 DIAGNOSIS — R41840 Attention and concentration deficit: Secondary | ICD-10-CM | POA: Diagnosis not present

## 2024-04-24 DIAGNOSIS — Z79899 Other long term (current) drug therapy: Secondary | ICD-10-CM

## 2024-04-24 LAB — POCT URINE DRUG SCREEN
Methylenedioxyamphetamine: NOT DETECTED
POC Amphetamine UR: POSITIVE — AB
POC BENZODIAZEPINES UR: NOT DETECTED
POC Barbiturate UR: NOT DETECTED
POC Cocaine UR: NOT DETECTED
POC Ecstasy UR: NOT DETECTED
POC Marijuana UR: NOT DETECTED
POC Methadone UR: NOT DETECTED
POC Methamphetamine UR: NOT DETECTED
POC Opiate Ur: NOT DETECTED
POC Oxycodone UR: NOT DETECTED
POC PHENCYCLIDINE UR: NOT DETECTED
POC TRICYCLICS UR: NOT DETECTED

## 2024-04-24 MED ORDER — AMPHETAMINE-DEXTROAMPHETAMINE 10 MG PO TABS: 10.0000 mg | ORAL_TABLET | Freq: Two times a day (BID) | ORAL | 0 refills | Status: DC

## 2024-04-24 MED ORDER — AMPHETAMINE-DEXTROAMPHET ER 20 MG PO CP24: 20.0000 mg | ORAL_CAPSULE | Freq: Every day | ORAL | 0 refills | Status: DC

## 2024-04-24 NOTE — Progress Notes (Signed)
 [facility] [address] [place], [place] [address]  Internal MEDICINE  Office Visit Note  Patient Name: Jamie Meadows  [id number]  [id number]  Date of Service: 04/24/2024  Chief Complaint  Patient presents with   Follow-up    HPI Jamie Meadows presents for a follow-up visit for ADHD, UDS, allergic rhinitis and refills.  ADHD -- current dose remains effective of adderall. BP and heart rate are normal. She denies any palpitations or other adverse side effects of the medication. Due for refills today Due for routine UDS today.  Allergic rhinitis -- takes cetirizine  as needed    Current Medication: Outpatient Encounter Medications as of 04/24/2024  Medication Sig   cetirizine  (ZYRTEC ) 10 MG tablet Take 1 tablet (10 mg total) by mouth daily.   fluticasone  (FLONASE ) 50 MCG/ACT nasal spray Place into both nostrils daily.   levonorgestrel  (MIRENA ) 20 MCG/24HR IUD 1 each by Intrauterine route once.   methocarbamol  (ROBAXIN ) 500 MG tablet Take 1 tablet (500 mg total) by mouth 2 (two) times daily as needed for muscle spasms.   Multiple Vitamin (MULTIVITAMIN) tablet Take 1 tablet by mouth daily.   Tdap (BOOSTRIX) 5-2.5-18.5 LF-MCG/0.5 injection Inject 0.5 mLs into the muscle once.   [DISCONTINUED] amphetamine -dextroamphetamine  (ADDERALL XR) 20 MG 24 hr capsule Take 1 capsule (20 mg total) by mouth daily.   [DISCONTINUED] amphetamine -dextroamphetamine  (ADDERALL XR) 20 MG 24 hr capsule Take 1 capsule (20 mg total) by mouth daily.   [DISCONTINUED] amphetamine -dextroamphetamine  (ADDERALL XR) 20 MG 24 hr capsule Take 1 capsule (20 mg total) by mouth daily.   [DISCONTINUED] amphetamine -dextroamphetamine  (ADDERALL) 10 MG tablet Take 1 tablet (10 mg total) by mouth 2 (two) times daily. As needed for symptoms of ADHD   [DISCONTINUED] amphetamine -dextroamphetamine  (ADDERALL) 10 MG tablet Take 1 tablet (10 mg total) by mouth 2 (two) times daily. As needed for symptoms of ADHD    [DISCONTINUED] amphetamine -dextroamphetamine  (ADDERALL) 10 MG tablet Take 1 tablet (10 mg total) by mouth 2 (two) times daily. As needed for symptoms of ADHD   amphetamine -dextroamphetamine  (ADDERALL XR) 20 MG 24 hr capsule Take 1 capsule (20 mg total) by mouth daily.   [START ON 06/19/2024] amphetamine -dextroamphetamine  (ADDERALL XR) 20 MG 24 hr capsule Take 1 capsule (20 mg total) by mouth daily.   [START ON 05/22/2024] amphetamine -dextroamphetamine  (ADDERALL XR) 20 MG 24 hr capsule Take 1 capsule (20 mg total) by mouth daily.   [START ON 05/22/2024] amphetamine -dextroamphetamine  (ADDERALL) 10 MG tablet Take 1 tablet (10 mg total) by mouth 2 (two) times daily. As needed for symptoms of ADHD   amphetamine -dextroamphetamine  (ADDERALL) 10 MG tablet Take 1 tablet (10 mg total) by mouth 2 (two) times daily. As needed for symptoms of ADHD   [START ON 06/19/2024] amphetamine -dextroamphetamine  (ADDERALL) 10 MG tablet Take 1 tablet (10 mg total) by mouth 2 (two) times daily. As needed for symptoms of ADHD   No facility-administered encounter medications on file as of 04/24/2024.    Surgical History: Past Surgical History:  Procedure Laterality Date   ABDOMINOPLASTY  2017   COLPOSCOPY  09/21/2015   WISDOM TOOTH EXTRACTION      Medical History: Past Medical History:  Diagnosis Date   ADHD    Allergic rhinitis    Fracture of clavicle with routine healing right   History of abnormal cervical Pap smear 2017   LGSIL with HRHPV   Sinusitis    Wrist fracture, closed    left    Family History: Family History  Problem Relation Age of  Onset   Diabetes Mother    Kidney disease Mother    Allergies Father    Breast cancer Maternal Aunt 47   Diabetes Maternal Grandfather    Hypertension Maternal Grandfather    Cancer Sister     Social History   Socioeconomic History   Marital status: Divorced    Spouse name: Not on file   Number of children: 4   Years of education: 16   Highest education level:  Not on file  Occupational History   Occupation: self emplyeed-[employer]  Tobacco Use   Smoking status: Former   Smokeless tobacco: Never  Advertising Account Planner   Vaping status: Never Used  Substance and Sexual Activity   Alcohol use: Yes    Alcohol/week: 7.0 standard drinks of alcohol    Types: 7 Glasses of wine per week    Comment: occasionally   Drug use: Never   Sexual activity: Yes    Birth control/protection: I.U.D.  Other Topics Concern   Not on file  Social History Narrative   Not on file   Social Drivers of Health   Financial Resource Strain: Low Risk  (10/17/2023)   Received from [facility]   Overall Financial Resource Strain (CARDIA)    Difficulty of Paying Living Expenses: Not hard at all  Food Insecurity: No Food Insecurity (10/17/2023)   [facility]   Hunger Vital Sign    Within the past 12 months, you worried that your food would run out before you got the money to buy more.: Never true    Within the past 12 months, the food you bought just didn't last and you didn't have money to get more.: Never true  Transportation Needs: No Transportation Needs (10/17/2023)   Received from [facility] - Transportation    In the past 12 months, has lack of transportation kept you from medical appointments or from getting medications?: No    Lack of Transportation (Non-Medical): No  Physical Activity: Insufficiently Active (09/04/2017)   Exercise Vital Sign    Days of Exercise per Week: 4 days    Minutes of Exercise per Session: 30 min  Stress: No Stress Concern Present (09/04/2017)   [employer] - Occupational Stress Questionnaire    Feeling of Stress : Not at all  Social Connections: Not on file  Intimate Partner Violence: Not on file      Review of Systems  Constitutional:  Positive for activity change (low energy), fatigue and unexpected weight change. Negative for chills.   HENT:  Negative for congestion, rhinorrhea, sneezing and sore throat.   Respiratory: Negative.  Negative for cough, chest tightness, shortness of breath and wheezing.   Cardiovascular: Negative.  Negative for chest pain and palpitations.  Gastrointestinal: Negative.  Negative for abdominal pain, constipation, diarrhea, nausea and vomiting.  Endocrine: Positive for heat intolerance.  Genitourinary:  Negative for dysuria and frequency.  Musculoskeletal:  Positive for arthralgias and back pain. Negative for joint swelling and neck pain.  Neurological:  Positive for headaches.  Psychiatric/Behavioral:  Positive for decreased concentration and sleep disturbance. Negative for behavioral problems (Depression), self-injury and suicidal ideas. The patient is not nervous/anxious.     Vital Signs: BP 110/80   Pulse 87   Temp 97.8 F (36.6 C)   Resp 16   Ht 5' 4 (1.626 m)   Wt 168 lb (76.2 kg)   SpO2 98%   BMI 28.84 kg/m  Physical Exam Vitals reviewed.  Constitutional:      General: She is not in acute distress.    Appearance: Normal appearance. She is obese. She is not ill-appearing.  HENT:     Head: Normocephalic and atraumatic.  Eyes:     Pupils: Pupils are equal, round, and reactive to light.  Cardiovascular:     Rate and Rhythm: Normal rate and regular rhythm.  Pulmonary:     Effort: Pulmonary effort is normal. No respiratory distress.  Neurological:     Mental Status: She is alert and oriented to person, place, and time.  Psychiatric:        Mood and Affect: Mood normal.        Behavior: Behavior normal.        Assessment/Plan: 1. Chronic allergic rhinitis (Primary) Continue cetirizine  as needed   2. Attention and concentration deficit Continue adderall as prescribed. Follow up in 3 months for additional refills. UDS due in 6 months  - amphetamine -dextroamphetamine  (ADDERALL XR) 20 MG 24 hr capsule; Take 1 capsule (20 mg total) by mouth daily.  Dispense: 30  capsule; Refill: 0 - amphetamine -dextroamphetamine  (ADDERALL) 10 MG tablet; Take 1 tablet (10 mg total) by mouth 2 (two) times daily. As needed for symptoms of ADHD  Dispense: 60 tablet; Refill: 0 - amphetamine -dextroamphetamine  (ADDERALL XR) 20 MG 24 hr capsule; Take 1 capsule (20 mg total) by mouth daily.  Dispense: 30 capsule; Refill: 0 - amphetamine -dextroamphetamine  (ADDERALL) 10 MG tablet; Take 1 tablet (10 mg total) by mouth 2 (two) times daily. As needed for symptoms of ADHD  Dispense: 60 tablet; Refill: 0 - amphetamine -dextroamphetamine  (ADDERALL XR) 20 MG 24 hr capsule; Take 1 capsule (20 mg total) by mouth daily.  Dispense: 30 capsule; Refill: 0 - amphetamine -dextroamphetamine  (ADDERALL) 10 MG tablet; Take 1 tablet (10 mg total) by mouth 2 (two) times daily. As needed for symptoms of ADHD  Dispense: 60 tablet; Refill: 0  3. Encounter for long-term (current) use of medications UDS done today, positive for amphetamine  which is consistent with her current prescriptions. UDS due again in 6 months.  - POCT Urine Drug Screen   General Counseling: Jamie Meadows verbalizes understanding of the findings of todays visit and agrees with plan of treatment. I have discussed any further diagnostic evaluation that may be needed or ordered today. We also reviewed her medications today. she has been encouraged to call the office with any questions or concerns that should arise related to todays visit.    Orders Placed This Encounter  Procedures   POCT Urine Drug Screen    Meds ordered this encounter  Medications   amphetamine -dextroamphetamine  (ADDERALL XR) 20 MG 24 hr capsule    Sig: Take 1 capsule (20 mg total) by mouth daily.    Dispense:  30 capsule    Refill:  0    Fill for december   amphetamine -dextroamphetamine  (ADDERALL) 10 MG tablet    Sig: Take 1 tablet (10 mg total) by mouth 2 (two) times daily. As needed for symptoms of ADHD    Dispense:  60 tablet    Refill:  0    Fill for January  2   amphetamine -dextroamphetamine  (ADDERALL XR) 20 MG 24 hr capsule    Sig: Take 1 capsule (20 mg total) by mouth daily.    Dispense:  30 capsule    Refill:  0    Fill for January 30   amphetamine -dextroamphetamine  (ADDERALL) 10 MG tablet    Sig: Take 1 tablet (10 mg total)  by mouth 2 (two) times daily. As needed for symptoms of ADHD    Dispense:  60 tablet    Refill:  0    Fill for december   amphetamine -dextroamphetamine  (ADDERALL XR) 20 MG 24 hr capsule    Sig: Take 1 capsule (20 mg total) by mouth daily.    Dispense:  30 capsule    Refill:  0    Fill for January 2   amphetamine -dextroamphetamine  (ADDERALL) 10 MG tablet    Sig: Take 1 tablet (10 mg total) by mouth 2 (two) times daily. As needed for symptoms of ADHD    Dispense:  60 tablet    Refill:  0    Fill for January 30    Return in about 3 months (around 07/15/2024) for F/U , ADHD med check, [facility] PCP.   Total time spent:30 Minutes Time spent includes review of chart, medications, test results, and follow up plan with the patient.   [place] Controlled Substance Database was reviewed by me.  This patient was seen by Mardy Maxin, FNP-C in collaboration with Dr. Sigrid Bathe as a part of collaborative care agreement.   Jezreel Sisk R. Maxin, MSN, FNP-C Internal medicine

## 2024-05-15 ENCOUNTER — Encounter: Payer: Self-pay | Admitting: Nurse Practitioner

## 2024-05-17 ENCOUNTER — Encounter: Payer: Self-pay | Admitting: Nurse Practitioner

## 2024-05-18 MED ORDER — NITROFURANTOIN MONOHYD MACRO 100 MG PO CAPS: 100.0000 mg | ORAL_CAPSULE | Freq: Two times a day (BID) | ORAL | 0 refills | Status: AC

## 2024-05-19 ENCOUNTER — Telehealth: Payer: Self-pay

## 2024-05-19 NOTE — Telephone Encounter (Signed)
 Lmom to call us back

## 2024-06-18 ENCOUNTER — Encounter: Payer: Self-pay | Admitting: Nurse Practitioner

## 2024-06-18 DIAGNOSIS — R41840 Attention and concentration deficit: Secondary | ICD-10-CM

## 2024-06-18 MED ORDER — AMPHETAMINE-DEXTROAMPHET ER 20 MG PO CP24: 20.0000 mg | ORAL_CAPSULE | Freq: Every day | ORAL | 0 refills | Status: AC

## 2024-06-18 MED ORDER — AMPHETAMINE-DEXTROAMPHETAMINE 10 MG PO TABS: 10.0000 mg | ORAL_TABLET | Freq: Two times a day (BID) | ORAL | 0 refills | Status: AC

## 2024-06-18 NOTE — Telephone Encounter (Signed)
 Spoke with pt that we received PA we are working on it

## 2024-07-20 ENCOUNTER — Ambulatory Visit: Admitting: Nurse Practitioner

## 2025-01-26 ENCOUNTER — Encounter: Admitting: Nurse Practitioner
